# Patient Record
Sex: Male | Born: 1935 | Race: White | Hispanic: No | Smoking: Never smoker
Health system: Southern US, Community
[De-identification: ages and names within clinical notes are randomized; demographics above are authoritative.]

## PROBLEM LIST (undated history)

## (undated) DIAGNOSIS — S42309A Unspecified fracture of shaft of humerus, unspecified arm, initial encounter for closed fracture: Secondary | ICD-10-CM

## (undated) DIAGNOSIS — E119 Type 2 diabetes mellitus without complications: Secondary | ICD-10-CM

## (undated) DIAGNOSIS — C9110 Chronic lymphocytic leukemia of B-cell type not having achieved remission: Secondary | ICD-10-CM

## (undated) DIAGNOSIS — D649 Anemia, unspecified: Secondary | ICD-10-CM

## (undated) DIAGNOSIS — G709 Myoneural disorder, unspecified: Secondary | ICD-10-CM

## (undated) DIAGNOSIS — C9590 Leukemia, unspecified not having achieved remission: Secondary | ICD-10-CM

## (undated) HISTORY — DX: Unspecified fracture of shaft of humerus, unspecified arm, initial encounter for closed fracture: S42.309A

## (undated) HISTORY — DX: Anemia, unspecified: D64.9

## (undated) HISTORY — DX: Myoneural disorder, unspecified: G70.9

---

## 2005-04-08 ENCOUNTER — Ambulatory Visit: Payer: Self-pay | Admitting: Internal Medicine

## 2005-04-19 ENCOUNTER — Ambulatory Visit: Payer: Self-pay | Admitting: Internal Medicine

## 2005-07-22 ENCOUNTER — Ambulatory Visit: Payer: Self-pay | Admitting: Internal Medicine

## 2005-08-20 ENCOUNTER — Ambulatory Visit: Payer: Self-pay | Admitting: Internal Medicine

## 2005-11-19 ENCOUNTER — Ambulatory Visit: Payer: Self-pay | Admitting: Internal Medicine

## 2005-12-20 ENCOUNTER — Ambulatory Visit: Payer: Self-pay | Admitting: Internal Medicine

## 2008-03-20 ENCOUNTER — Ambulatory Visit: Payer: Self-pay | Admitting: Internal Medicine

## 2008-04-02 ENCOUNTER — Ambulatory Visit: Payer: Self-pay | Admitting: Internal Medicine

## 2008-04-19 ENCOUNTER — Ambulatory Visit: Payer: Self-pay | Admitting: Internal Medicine

## 2011-04-20 ENCOUNTER — Ambulatory Visit: Payer: Self-pay | Admitting: Internal Medicine

## 2011-04-27 ENCOUNTER — Inpatient Hospital Stay: Payer: Self-pay | Admitting: Internal Medicine

## 2011-05-07 ENCOUNTER — Ambulatory Visit: Payer: Self-pay | Admitting: Internal Medicine

## 2011-05-21 ENCOUNTER — Ambulatory Visit: Payer: Self-pay | Admitting: Internal Medicine

## 2011-06-20 ENCOUNTER — Ambulatory Visit: Payer: Self-pay | Admitting: Internal Medicine

## 2011-07-21 ENCOUNTER — Ambulatory Visit: Payer: Self-pay | Admitting: Internal Medicine

## 2011-08-21 ENCOUNTER — Ambulatory Visit: Payer: Self-pay | Admitting: Internal Medicine

## 2011-09-20 ENCOUNTER — Ambulatory Visit: Payer: Self-pay | Admitting: Internal Medicine

## 2011-10-21 ENCOUNTER — Ambulatory Visit: Payer: Self-pay | Admitting: Internal Medicine

## 2011-11-20 ENCOUNTER — Ambulatory Visit: Payer: Self-pay | Admitting: Internal Medicine

## 2011-12-21 ENCOUNTER — Ambulatory Visit: Payer: Self-pay | Admitting: Internal Medicine

## 2012-02-14 ENCOUNTER — Ambulatory Visit: Payer: Self-pay | Admitting: Internal Medicine

## 2012-02-14 LAB — CBC CANCER CENTER
Eosinophil #: 0.3 x10 3/mm (ref 0.0–0.7)
Eosinophil %: 3.1 %
HGB: 13.1 g/dL (ref 13.0–18.0)
Lymphocyte #: 6.3 x10 3/mm — ABNORMAL HIGH (ref 1.0–3.6)
Lymphocyte %: 61.9 %
MCV: 79 fL — ABNORMAL LOW (ref 80–100)
Monocyte #: 0.6 x10 3/mm (ref 0.0–0.7)
Monocyte %: 5.9 %
Neutrophil #: 2.9 x10 3/mm (ref 1.4–6.5)
Neutrophil %: 28.8 %
Platelet: 151 x10 3/mm (ref 150–440)
RBC: 5.01 10*6/uL (ref 4.40–5.90)
WBC: 10.1 x10 3/mm (ref 3.8–10.6)

## 2012-02-14 LAB — BASIC METABOLIC PANEL
Calcium, Total: 8.7 mg/dL (ref 8.5–10.1)
Co2: 29 mmol/L (ref 21–32)
Creatinine: 0.96 mg/dL (ref 0.60–1.30)
EGFR (Non-African Amer.): >60
Glucose: 273 mg/dL — ABNORMAL HIGH (ref 65–99)
Osmolality: 292 (ref 275–301)

## 2012-02-14 LAB — HEPATIC FUNCTION PANEL A (ARMC)
Albumin: 3.7 g/dL (ref 3.4–5.0)
Bilirubin,Total: 1.1 mg/dL — ABNORMAL HIGH (ref 0.2–1.0)
SGOT(AST): 19 U/L (ref 15–37)
SGPT (ALT): 31 U/L
Total Protein: 6.3 g/dL — ABNORMAL LOW (ref 6.4–8.2)

## 2012-02-18 ENCOUNTER — Ambulatory Visit: Payer: Self-pay | Admitting: Internal Medicine

## 2012-03-13 LAB — CBC CANCER CENTER
Eosinophil #: 0.2 x10 3/mm (ref 0.0–0.7)
Eosinophil %: 2.7 %
HCT: 41 % (ref 40.0–52.0)
Lymphocyte #: 4.1 x10 3/mm — ABNORMAL HIGH (ref 1.0–3.6)
MCH: 26.3 pg (ref 26.0–34.0)
MCHC: 32.8 g/dL (ref 32.0–36.0)
Monocyte #: 0.5 x10 3/mm (ref 0.0–0.7)
Monocyte %: 6.2 %
Neutrophil #: 3.1 x10 3/mm (ref 1.4–6.5)
Neutrophil %: 38.9 %

## 2012-03-20 ENCOUNTER — Ambulatory Visit: Payer: Self-pay | Admitting: Internal Medicine

## 2012-04-10 LAB — CBC CANCER CENTER
Basophil #: 0 x10 3/mm (ref 0.0–0.1)
Basophil %: 0.5 %
Eosinophil #: 0.3 x10 3/mm (ref 0.0–0.7)
HCT: 44.6 % (ref 40.0–52.0)
Lymphocyte #: 5.7 x10 3/mm — ABNORMAL HIGH (ref 1.0–3.6)
Lymphocyte %: 59.7 %
MCH: 25.7 pg — ABNORMAL LOW (ref 26.0–34.0)
MCHC: 31.3 g/dL — ABNORMAL LOW (ref 32.0–36.0)
Monocyte %: 6.8 %
Neutrophil #: 2.9 x10 3/mm (ref 1.4–6.5)
Neutrophil %: 30.2 %
RDW: 15.4 % — ABNORMAL HIGH (ref 11.5–14.5)
WBC: 9.5 x10 3/mm (ref 3.8–10.6)

## 2012-04-19 ENCOUNTER — Ambulatory Visit: Payer: Self-pay

## 2012-04-19 ENCOUNTER — Ambulatory Visit: Payer: Self-pay | Admitting: Internal Medicine

## 2012-06-04 ENCOUNTER — Observation Stay: Payer: Self-pay | Admitting: Hematology & Oncology

## 2012-06-04 LAB — CBC WITH DIFFERENTIAL/PLATELET
Comment - H1-Com1: NORMAL
HCT: 39.4 % — ABNORMAL LOW (ref 40.0–52.0)
HGB: 12.4 g/dL — ABNORMAL LOW (ref 13.0–18.0)
MCHC: 31.5 g/dL — ABNORMAL LOW (ref 32.0–36.0)
MCV: 84 fL (ref 80–100)
Monocytes: 2 %
Platelet: 140 10*3/uL — ABNORMAL LOW (ref 150–440)
Segmented Neutrophils: 25 %

## 2012-06-04 LAB — COMPREHENSIVE METABOLIC PANEL
Albumin: 3.4 g/dL (ref 3.4–5.0)
Alkaline Phosphatase: 79 U/L (ref 50–136)
Anion Gap: 8 (ref 7–16)
BUN: 26 mg/dL — ABNORMAL HIGH (ref 7–18)
Calcium, Total: 8.6 mg/dL (ref 8.5–10.1)
Chloride: 106 mmol/L (ref 98–107)
Co2: 29 mmol/L (ref 21–32)
Creatinine: 0.82 mg/dL (ref 0.60–1.30)
EGFR (African American): >60
EGFR (Non-African Amer.): >60
Osmolality: 296 (ref 275–301)
SGOT(AST): 11 U/L — ABNORMAL LOW (ref 15–37)
Sodium: 143 mmol/L (ref 136–145)

## 2012-06-04 LAB — LACTATE DEHYDROGENASE: LDH: 141 U/L (ref 87–241)

## 2012-06-04 LAB — RETICULOCYTES: Reticulocyte: 1.23 % (ref 0.5–1.5)

## 2013-09-10 ENCOUNTER — Ambulatory Visit: Payer: Self-pay | Admitting: Internal Medicine

## 2015-11-19 ENCOUNTER — Telehealth: Payer: Self-pay | Admitting: *Deleted

## 2015-11-19 DIAGNOSIS — C9110 Chronic lymphocytic leukemia of B-cell type not having achieved remission: Secondary | ICD-10-CM

## 2015-11-19 NOTE — Telephone Encounter (Addendum)
Called to rport that patient has fallen and is at Urgent care at [facility]. 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 I explained that we can not order labs at [facility] but told him he can request that they draw a CBC and metabolic panel. He repeated this back to me and said he will request this and ask them to fax a copy to [place]

## 2015-11-19 NOTE — Telephone Encounter (Signed)
I printed labs and showed them to Dr Rogue Bussing, He suggests that patient call adn make an appt very soon. And wants a cbc, cmp, LDH drawn when he comes in.

## 2015-11-20 NOTE — Telephone Encounter (Signed)
[name] called back after having a long discussion with his father and has scheduled an appt for next Wednesday. Lab orders entered

## 2015-11-21 ENCOUNTER — Emergency Department: Payer: Medicare Other

## 2015-11-21 ENCOUNTER — Telehealth: Payer: Self-pay | Admitting: *Deleted

## 2015-11-21 ENCOUNTER — Encounter: Payer: Self-pay | Admitting: Emergency Medicine

## 2015-11-21 ENCOUNTER — Inpatient Hospital Stay
Admission: EM | Admit: 2015-11-21 | Discharge: 2015-11-26 | DRG: 637 | Disposition: A | Discharge status: Skilled Nursing Facility | Payer: Medicare Other | Attending: Internal Medicine | Admitting: Internal Medicine

## 2015-11-21 DIAGNOSIS — S42209A Unspecified fracture of upper end of unspecified humerus, initial encounter for closed fracture: Secondary | ICD-10-CM

## 2015-11-21 DIAGNOSIS — C9110 Chronic lymphocytic leukemia of B-cell type not having achieved remission: Secondary | ICD-10-CM | POA: Diagnosis present

## 2015-11-21 DIAGNOSIS — W19XXXA Unspecified fall, initial encounter: Secondary | ICD-10-CM | POA: Diagnosis present

## 2015-11-21 DIAGNOSIS — Z8673 Personal history of transient ischemic attack (TIA), and cerebral infarction without residual deficits: Secondary | ICD-10-CM

## 2015-11-21 DIAGNOSIS — Z681 Body mass index (BMI) 19 or less, adult: Secondary | ICD-10-CM

## 2015-11-21 DIAGNOSIS — D649 Anemia, unspecified: Secondary | ICD-10-CM | POA: Diagnosis present

## 2015-11-21 DIAGNOSIS — R531 Weakness: Secondary | ICD-10-CM

## 2015-11-21 DIAGNOSIS — S42202A Unspecified fracture of upper end of left humerus, initial encounter for closed fracture: Secondary | ICD-10-CM

## 2015-11-21 DIAGNOSIS — T383X6A Underdosing of insulin and oral hypoglycemic [antidiabetic] drugs, initial encounter: Secondary | ICD-10-CM | POA: Diagnosis present

## 2015-11-21 DIAGNOSIS — Z66 Do not resuscitate: Secondary | ICD-10-CM | POA: Diagnosis present

## 2015-11-21 DIAGNOSIS — Y92009 Unspecified place in unspecified non-institutional (private) residence as the place of occurrence of the external cause: Secondary | ICD-10-CM | POA: Diagnosis not present

## 2015-11-21 DIAGNOSIS — R64 Cachexia: Secondary | ICD-10-CM

## 2015-11-21 DIAGNOSIS — Z9221 Personal history of antineoplastic chemotherapy: Secondary | ICD-10-CM | POA: Diagnosis not present

## 2015-11-21 DIAGNOSIS — E1165 Type 2 diabetes mellitus with hyperglycemia: Secondary | ICD-10-CM | POA: Diagnosis present

## 2015-11-21 DIAGNOSIS — D759 Disease of blood and blood-forming organs, unspecified: Secondary | ICD-10-CM | POA: Diagnosis not present

## 2015-11-21 DIAGNOSIS — D696 Thrombocytopenia, unspecified: Secondary | ICD-10-CM | POA: Diagnosis present

## 2015-11-21 DIAGNOSIS — S42302A Unspecified fracture of shaft of humerus, left arm, initial encounter for closed fracture: Secondary | ICD-10-CM

## 2015-11-21 DIAGNOSIS — Y939 Activity, unspecified: Secondary | ICD-10-CM | POA: Diagnosis not present

## 2015-11-21 DIAGNOSIS — D72829 Elevated white blood cell count, unspecified: Secondary | ICD-10-CM

## 2015-11-21 DIAGNOSIS — R739 Hyperglycemia, unspecified: Secondary | ICD-10-CM

## 2015-11-21 DIAGNOSIS — Z9119 Patient's noncompliance with other medical treatment and regimen: Secondary | ICD-10-CM

## 2015-11-21 DIAGNOSIS — C9112 Chronic lymphocytic leukemia of B-cell type in relapse: Secondary | ICD-10-CM | POA: Diagnosis not present

## 2015-11-21 DIAGNOSIS — E1310 Other specified diabetes mellitus with ketoacidosis without coma: Secondary | ICD-10-CM

## 2015-11-21 DIAGNOSIS — E43 Unspecified severe protein-calorie malnutrition: Secondary | ICD-10-CM | POA: Diagnosis present

## 2015-11-21 DIAGNOSIS — E875 Hyperkalemia: Secondary | ICD-10-CM | POA: Diagnosis present

## 2015-11-21 DIAGNOSIS — S42212A Unspecified displaced fracture of surgical neck of left humerus, initial encounter for closed fracture: Secondary | ICD-10-CM | POA: Diagnosis present

## 2015-11-21 DIAGNOSIS — R296 Repeated falls: Secondary | ICD-10-CM | POA: Diagnosis present

## 2015-11-21 HISTORY — DX: Type 2 diabetes mellitus without complications: E11.9

## 2015-11-21 HISTORY — DX: Leukemia, unspecified not having achieved remission: C95.90

## 2015-11-21 LAB — CBC WITH DIFFERENTIAL/PLATELET
BASOS ABS: 0 10*3/uL (ref 0–0.1)
Basophils Relative: 0 %
Blasts: 0 %
EOS ABS: 0 10*3/uL (ref 0–0.7)
EOS PCT: 0 %
HCT: 24.4 % — ABNORMAL LOW (ref 40.0–52.0)
Hemoglobin: 7.8 g/dL — ABNORMAL LOW (ref 13.0–18.0)
LYMPHS PCT: 98 %
Lymphs Abs: 142.1 10*3/uL — ABNORMAL HIGH (ref 1.0–3.6)
MCH: 28.5 pg (ref 26.0–34.0)
MCHC: 31.9 g/dL — ABNORMAL LOW (ref 32.0–36.0)
MCV: 89.3 fL (ref 80.0–100.0)
MONOS PCT: 0 %
Monocytes Absolute: 5.3 10*3/uL — ABNORMAL HIGH (ref 0.2–1.0)
NEUTROS PCT: 2 %
Neutro Abs: 17.5 10*3/uL — ABNORMAL HIGH (ref 1.4–6.5)
PLATELETS: 106 10*3/uL — AB (ref 150–440)
RBC: 2.74 MIL/uL — AB (ref 4.40–5.90)
RDW: 15.2 % — ABNORMAL HIGH (ref 11.5–14.5)
WBC: 175.4 10*3/uL — AB (ref 3.8–10.6)

## 2015-11-21 LAB — URINALYSIS COMPLETE WITH MICROSCOPIC (ARMC ONLY)
Bilirubin Urine: NEGATIVE
Glucose, UA: >500 mg/dL — AB
Hgb urine dipstick: NEGATIVE
KETONES UR: NEGATIVE mg/dL
Leukocytes, UA: NEGATIVE
Nitrite: NEGATIVE
PH: 5 (ref 5.0–8.0)
PROTEIN: 30 mg/dL — AB
RBC / HPF: NONE SEEN RBC/hpf (ref 0–5)
SQUAMOUS EPITHELIAL / LPF: NONE SEEN
Specific Gravity, Urine: 1.029 (ref 1.005–1.030)

## 2015-11-21 LAB — COMPREHENSIVE METABOLIC PANEL
ALBUMIN: 3.6 g/dL (ref 3.5–5.0)
ALT: 16 U/L — AB (ref 17–63)
AST: 25 U/L (ref 15–41)
Alkaline Phosphatase: 137 U/L — ABNORMAL HIGH (ref 38–126)
Anion gap: 7 (ref 5–15)
BUN: 29 mg/dL — AB (ref 6–20)
CHLORIDE: 103 mmol/L (ref 101–111)
CO2: 30 mmol/L (ref 22–32)
CREATININE: 0.62 mg/dL (ref 0.61–1.24)
Calcium: 8.7 mg/dL — ABNORMAL LOW (ref 8.9–10.3)
GFR calc Af Amer: >60 mL/min (ref >60)
GFR calc non Af Amer: >60 mL/min (ref >60)
GLUCOSE: 379 mg/dL — AB (ref 65–99)
Potassium: 5.8 mmol/L — ABNORMAL HIGH (ref 3.5–5.1)
SODIUM: 140 mmol/L (ref 135–145)
Total Bilirubin: 0.6 mg/dL (ref 0.3–1.2)
Total Protein: 5.9 g/dL — ABNORMAL LOW (ref 6.5–8.1)

## 2015-11-21 LAB — GLUCOSE, CAPILLARY
GLUCOSE-CAPILLARY: 176 mg/dL — AB (ref 65–99)
Glucose-Capillary: 263 mg/dL — ABNORMAL HIGH (ref 65–99)

## 2015-11-21 LAB — TROPONIN I: Troponin I: <0.03 ng/mL (ref <0.031)

## 2015-11-21 LAB — ABO/RH: ABO/RH(D): B POS

## 2015-11-21 LAB — HEMOGLOBIN AND HEMATOCRIT, BLOOD
HCT: 22.2 % — ABNORMAL LOW (ref 40.0–52.0)
Hemoglobin: 7.2 g/dL — ABNORMAL LOW (ref 13.0–18.0)

## 2015-11-21 LAB — CK: Total CK: 39 U/L — ABNORMAL LOW (ref 49–397)

## 2015-11-21 LAB — PREPARE RBC (CROSSMATCH)

## 2015-11-21 LAB — LACTATE DEHYDROGENASE: LDH: 201 U/L — ABNORMAL HIGH (ref 98–192)

## 2015-11-21 MED ORDER — INSULIN ASPART 100 UNIT/ML ~~LOC~~ SOLN
0.0000 [IU] | Freq: Three times a day (TID) | SUBCUTANEOUS | Status: DC
  Administered 2015-11-21: 5 [IU] via SUBCUTANEOUS
  Administered 2015-11-23: 12:00:00 2 [IU] via SUBCUTANEOUS
  Administered 2015-11-23: 7 [IU] via SUBCUTANEOUS
  Administered 2015-11-23 – 2015-11-24 (×2): 3 [IU] via SUBCUTANEOUS
  Administered 2015-11-24: 18:00:00 5 [IU] via SUBCUTANEOUS
  Administered 2015-11-24: 09:00:00 2 [IU] via SUBCUTANEOUS
  Administered 2015-11-25 (×2): 3 [IU] via SUBCUTANEOUS
  Administered 2015-11-25 – 2015-11-26 (×2): 2 [IU] via SUBCUTANEOUS
  Administered 2015-11-26: 7 [IU] via SUBCUTANEOUS
  Filled 2015-11-21: qty 3
  Filled 2015-11-21: qty 2
  Filled 2015-11-21: qty 5
  Filled 2015-11-21: qty 3
  Filled 2015-11-21: qty 10
  Filled 2015-11-21: qty 5
  Filled 2015-11-21: qty 7
  Filled 2015-11-21: qty 6
  Filled 2015-11-21 (×2): qty 2
  Filled 2015-11-21: qty 5
  Filled 2015-11-21 (×2): qty 3

## 2015-11-21 MED ORDER — SENNOSIDES-DOCUSATE SODIUM 8.6-50 MG PO TABS: 1.0000 | ORAL_TABLET | Freq: Every evening | ORAL | Status: DC | PRN

## 2015-11-21 MED ORDER — INSULIN ASPART 100 UNIT/ML ~~LOC~~ SOLN
3.0000 [IU] | Freq: Three times a day (TID) | SUBCUTANEOUS | Status: DC
  Administered 2015-11-21 – 2015-11-26 (×12): 3 [IU] via SUBCUTANEOUS
  Filled 2015-11-21 (×8): qty 3

## 2015-11-21 MED ORDER — ONDANSETRON HCL 4 MG/2ML IJ SOLN: 4.0000 mg | Freq: Four times a day (QID) | INTRAMUSCULAR | Status: DC | PRN

## 2015-11-21 MED ORDER — SODIUM CHLORIDE 0.9 % IV SOLN
INTRAVENOUS | Status: DC
  Administered 2015-11-21 – 2015-11-25 (×6): via INTRAVENOUS

## 2015-11-21 MED ORDER — INSULIN DETEMIR 100 UNIT/ML ~~LOC~~ SOLN
5.0000 [IU] | Freq: Every day | SUBCUTANEOUS | Status: DC
  Administered 2015-11-21 – 2015-11-22 (×2): 5 [IU] via SUBCUTANEOUS
  Filled 2015-11-21 (×4): qty 0.05

## 2015-11-21 MED ORDER — SODIUM CHLORIDE 0.9 % IV SOLN
Freq: Once | INTRAVENOUS | Status: AC
  Administered 2015-11-21: 11:00:00 via INTRAVENOUS

## 2015-11-21 MED ORDER — SODIUM POLYSTYRENE SULFONATE 15 GM/60ML PO SUSP
30.0000 g | Freq: Once | ORAL | Status: AC
  Administered 2015-11-21: 30 g via ORAL
  Filled 2015-11-21: qty 120

## 2015-11-21 MED ORDER — ALUM & MAG HYDROXIDE-SIMETH 200-200-20 MG/5ML PO SUSP: 30.0000 mL | Freq: Four times a day (QID) | ORAL | Status: DC | PRN

## 2015-11-21 MED ORDER — ACETAMINOPHEN 325 MG PO TABS
650.0000 mg | ORAL_TABLET | Freq: Four times a day (QID) | ORAL | Status: DC | PRN
  Administered 2015-11-23: 650 mg via ORAL
  Filled 2015-11-21: qty 2

## 2015-11-21 MED ORDER — ONDANSETRON HCL 4 MG PO TABS: 4.0000 mg | ORAL_TABLET | Freq: Four times a day (QID) | ORAL | Status: DC | PRN

## 2015-11-21 MED ORDER — ACETAMINOPHEN 650 MG RE SUPP: 650.0000 mg | Freq: Four times a day (QID) | RECTAL | Status: DC | PRN

## 2015-11-21 MED ORDER — SODIUM CHLORIDE 0.9 % IV SOLN
Freq: Once | INTRAVENOUS | Status: AC
  Administered 2015-11-21: 17:00:00 via INTRAVENOUS

## 2015-11-21 NOTE — Telephone Encounter (Signed)
Called to report patient is in ER. Per records, pt has fallen several times in past 2 days

## 2015-11-21 NOTE — ED Notes (Signed)
This nurse spoke with Bing Plume over the phone and received verbal consent for patient to received blood products.

## 2015-11-21 NOTE — ED Notes (Signed)
Patient transported to CT 

## 2015-11-21 NOTE — ED Notes (Signed)
Pt here from home via [facility]. EMS reports pt is A&O x4, pt fell last night and again this AM. EMS denies any injuries. Pt with hx of leukemia, not undergoing any treatment.

## 2015-11-21 NOTE — ED Provider Notes (Signed)
[facility] [place] Emergency Department Provider Note     Time seen: ----------------------------------------- 9:54 AM on 11/21/2015 -----------------------------------------    I have reviewed the triage vital signs and the nursing notes.   HISTORY  Chief Complaint Fall    HPI Wesley Mcguire is a 81 y.o. male who presents to ER for falls. Patient fell last night and again this morning. EMS denies that he had any injury. Patient does not history of leukemia but is not undergoing any treatment.Patient reportedly is wearing a shoulder sling from a fall last month, this is his only complaint of pain. He denies any recent changes in his medicines, states he is eating and drinking normally. Past medical history positive for CLL  There are no active problems to display for this patient.   No past surgical history on file.  Allergies Review of patient's allergies indicates not on file.  Social History Social History  Substance Use Topics  . Smoking status: Not on file  . Smokeless tobacco: Not on file  . Alcohol Use: Not on file    Review of Systems Constitutional: Negative for fever. Eyes: Negative for visual changes. ENT: Negative for sore throat. Cardiovascular: Negative for chest pain. Respiratory: Negative for shortness of breath. Gastrointestinal: Negative for abdominal pain, vomiting and diarrhea. Genitourinary: Negative for dysuria. Musculoskeletal: Negative for back pain. Skin: Negative for rash. Neurological: Negative for headaches, focal weakness or numbness. Positive for frequent falls  10-point ROS otherwise negative.  ____________________________________________   PHYSICAL EXAM:  VITAL SIGNS: ED Triage Vitals  Enc Vitals Group     BP --      Pulse --      Resp --      Temp --      Temp src --      SpO2 11/21/15 0952 96 %     Weight --      Height --      Head Cir --      Peak Flow --      Pain Score --      Pain Loc  --      Pain Edu? --      Excl. in [place]? --     Constitutional: Alert, in no distress. Eyes: Conjunctivae are normal. PERRL. Normal extraocular movements. ENT   Head: Normocephalic and atraumatic.   Nose: No congestion/rhinnorhea.   Mouth/Throat: Mucous membranes are moist.   Neck: No stridor. Cardiovascular: Normal rate, regular rhythm. Normal and symmetric distal pulses are present in all extremities. No murmurs, rubs, or gallops. Respiratory: Normal respiratory effort without tachypnea nor retractions. Breath sounds are clear and equal bilaterally. No wheezes/rales/rhonchi. Gastrointestinal: Soft and nontender. No distention.  Musculoskeletal: Nontender with normal range of motion in all extremities. No joint effusions.  No lower extremity tenderness nor edema. Neurologic:  Normal speech and language. No gross focal neurologic deficits are appreciated. Speech is normal.  Skin:  Skin is warm, dry and intact. No rash noted. Psychiatric: Mood and affect are normal. Speech and behavior are normal. Patient exhibits appropriate insight and judgment. ____________________________________________  EKG: Interpreted by me. Sinus rhythm with a rate of 79 bpm, PVCs, low voltage, normal axis. No evidence of acute infarction.  ____________________________________________  ED COURSE:  Pertinent labs & imaging results that were available during my care of the patient were reviewed by me and considered in my medical decision making (see chart for details). Patients in no acute distress, will check basic labs and reevaluate. ____________________________________________  LABS (pertinent positives/negatives)  Labs Reviewed  COMPREHENSIVE METABOLIC PANEL - Abnormal; Notable for the following:    Potassium 5.8 (*)    Glucose, Bld 379 (*)    BUN 29 (*)    Calcium 8.7 (*)    Total Protein 5.9 (*)    ALT 16 (*)    Alkaline Phosphatase 137 (*)    All other components within normal  limits  URINALYSIS COMPLETEWITH MICROSCOPIC ([facility] ONLY) - Abnormal; Notable for the following:    Color, Urine YELLOW (*)    APPearance CLEAR (*)    Glucose, UA >500 (*)    Protein, ur 30 (*)    Bacteria, UA RARE (*)    All other components within normal limits  TROPONIN I  CBC WITH DIFFERENTIAL/PLATELET   CRITICAL CARE Performed by: Earleen [place]   Total critical care time: 30 minutes  Critical care time was exclusive of separately billable procedures and treating other patients.  Critical care was necessary to treat or prevent imminent or life-threatening deterioration.  Critical care was time spent personally by me on the following activities: development of treatment plan with patient and/or surrogate as well as nursing, discussions with consultants, evaluation of patient's response to treatment, examination of patient, obtaining history from patient or surrogate, ordering and performing treatments and interventions, ordering and review of laboratory studies, ordering and review of radiographic studies, pulse oximetry and re-evaluation of patient's condition.  RADIOLOGY Images were viewed by me  CT head, chest x-ray IMPRESSION: 1. No evidence of acute cardiopulmonary disease. 2. Proximal left humerus fracture.  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 No acute intracranial abnormality seen. ____________________________________________  FINAL ASSESSMENT AND PLAN  Weakness, frequent falls, humerus fracture, CLL, hyperkalemia  Plan: Patient with labs and imaging as dictated above. Patient is started on normal saline here. I discussed with oncology on call who recommends admission at this time. Dr.Brahmanday has seen the patient in the ER and agrees with admission. He's been started on saline for his hyperkalemia and hyperglycemia, likely new onset diabetes as well.   Earleen [place], MD   Earleen [place], MD 11/21/15 [phone]

## 2015-11-21 NOTE — Consult Note (Addendum)
[facility] CONSULT NOTE  Patient Care Team: Pcp Not In System as PCP - General  CHIEF COMPLAINTS/PURPOSE OF CONSULTATION: CLL  HISTORY OF PRESENTING ILLNESS: Please note patient is a poor historian/no family by the bedside. History is reviewed talking with the patient/ER physician/reviewing old office records  Wesley Mcguire 81 y.o.  male with a prior history of CLL that was treated with rituximab single agent at least 2-3 years ago [Dr.Pandit]- with good response on the treatment. Patient had not followed up in the [place] for the last few years.   However, more recently patient had been falling down hurting himself. Patient fell this morning- and I x-ray noted to have left-sided proximal humerus fracture.  More recently his blood counts were checked as an outpatient- white count was noted to be around 220,000 with a hemoglobin around 9 platelets 117. In the emergency room patient's potassium was elevated at 5.8; creatinine normal-0.6 to; however glucose elevated at 379. CT brain negative for any acute injury.   ROS: A complete 10 point review of systems attempted/ however unreliable given patient's poor history giving skills.   MEDICAL HISTORY:  Past Medical History  Diagnosis Date  . Diabetes mellitus without complication ([place])   . Leukemia ([place])     SURGICAL HISTORY:/Difficult to assess  No past surgical history on file.  SOCIAL HISTORY: /difficult to assess Social History   Social History  . Marital Status: Divorced    Spouse Name: N/A  . Number of Children: N/A  . Years of Education: N/A   Occupational History  . Not on file.   Social History Main Topics  . Smoking status: Never Smoker   . Smokeless tobacco: Not on file  . Alcohol Use: No  . Drug Use: Not on file  . Sexual Activity: Not on file   Other Topics Concern  . Not on file   Social History Narrative  . No narrative on file    FAMILY HISTORY: No family history on  file./difficult to assess   ALLERGIES:  has No Known Allergies.  MEDICATIONS:  No current facility-administered medications for this encounter.   Current Outpatient Prescriptions  Medication Sig Dispense Refill  . traMADol (ULTRAM) 50 MG tablet Take 50 mg by mouth every 6 (six) hours as needed for moderate pain or severe pain.         Marland Kitchen  PHYSICAL EXAMINATION:   Filed Vitals:   11/21/15 1230 11/21/15 1245  BP: 112/68   Pulse:    Temp:    Resp: 19 19   Filed Weights   11/21/15 0956  Weight: 109 lb (49.442 kg)    GENERAL: Cachectic appearing Caucasian male patient ; resting comfortably in the ER /stretcher. No acute distress. No family around EYES: no pallor or icterus OROPHARYNX: no thrush or ulceration;  poor dentition NECK: supple, no masses felt LYMPH:  no palpable lymphadenopathy in the cervical, axillary or inguinal regions LUNGS: clear to auscultation and  No wheeze or crackles HEART/CVS: regular rate & rhythm and no murmurs; No lower extremity edema ABDOMEN: abdomen soft, non-tender and normal bowel sounds Musculoskeletal:no cyanosis of digits and no clubbing  PSYCH: alert & oriented1-2.  NEURO: no focal motor/sensory deficits SKIN:  no rashes or significant lesions.   LABORATORY DATA:  I have reviewed the data as listed Lab Results  Component Value Date   WBC 13.4* 06/04/2012   HGB 12.4* 06/04/2012   HCT 39.4* 06/04/2012   MCV 84 06/04/2012  PLT 140* 06/04/2012    Recent Labs  11/21/15 1016  NA 140  K 5.8*  CL 103  CO2 30  GLUCOSE 379*  BUN 29*  CREATININE 0.62  CALCIUM 8.7*  GFRNONAA >60  GFRAA >60  PROT 5.9*  ALBUMIN 3.6  AST 25  ALT 16*  ALKPHOS 137*  BILITOT 0.6    RADIOGRAPHIC STUDIES: I have personally reviewed the radiological images as listed and agreed with the findings in the report. Dg Chest 1 View  11/21/2015  CLINICAL DATA:  Weakness.  Frequent falls. EXAM: CHEST 1 VIEW COMPARISON:  04/27/2011 FINDINGS: There is a  proximal left humerus fracture as described on dedicated humerus radiography. Normal heart size and mediastinal contours. There is no edema, consolidation, effusion, or pneumothorax. Marked osteopenia. Gas under the diaphragm appears intraluminal. There is no indication of abdominal pain. IMPRESSION: 1. No evidence of acute cardiopulmonary disease. 2. Proximal left humerus fracture. Electronically Signed   By: Monte Fantasia M.D.   On: 11/21/2015 11:24   Ct Head Wo Contrast  11/21/2015  CLINICAL DATA:  Multiple falls.  Weakness. EXAM: CT HEAD WITHOUT CONTRAST TECHNIQUE: Contiguous axial images were obtained from the base of the skull through the vertex without intravenous contrast. COMPARISON:  None. FINDINGS: Bony calvarium appears intact. Left temporal encephalomalacia is noted consistent with old infarction. No mass effect or midline shift is noted. Ventricular size is within normal limits. There is no evidence of mass lesion, hemorrhage or acute infarction. 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 No acute intracranial abnormality seen. Electronically Signed   By: Marijo Conception, M.D.   On: 11/21/2015 11:10   Dg Humerus Left  11/21/2015  CLINICAL DATA:  Several falls over the past few days. Swelling and bruising of the left arm. See LL EXAM: LEFT HUMERUS - 2+ VIEW COMPARISON:  None. FINDINGS: There is a fracture across the surgical neck of the humerus with at least 5 mm of medial displacement. Probable continuation across the greater tuberosity. No visible head continuation but not well evaluated due to obliquity. No indication of dislocation. Lucency in the distal clavicle appears corticated and chronic. Marked osteopenia. IMPRESSION: 1. Displaced surgical neck fracture of the left humerus, likely with continuation across the greater tuberosity. The fracture would be better evaluated by shoulder series. 2. Marked osteopenia. Electronically Signed   By: Monte Fantasia M.D.   On: 11/21/2015 11:20     ASSESSMENT & PLAN:   81 year old male patient with a history of CLL with recurrence of elevated white count- currently admitted to the hospital for generalized weakness/multiple falls. Unfortunately history is limited based on patient's poor history giving skills.  # CLL- most recent white count is 220k/hemoglobin 9.3/ platelets 117. Clinically I do not think patient's current CLL recurrence is the cause of his generalized weakness. However this needs further workup including- flow cytometry to rule out any transformation to aggressive lymphoma/leukemia. Patient will also need CAT scan chest abdomen pelvis once hyperkalemia improves. Also check LDH.  # Generalized weakness-unclear etiology; CLL recurrence is less likely possibility/ Other Causes her to be ruled out. Question hyperglycemia/poorly controlled diabetes reason for his falls/generalized weakness.   # With regards to humerus fracture- if any surgical intervention needed; okay from hematology standpoint- as long hemoglobin is above 10.  Thank you Dr. Jimmye Norman for allowing me to participate in the care of your pleasant patient.      Cammie Sickle, MD 11/21/2015 1:00 PM   I spoke to the patient's son  over the phone Annie Main at [phone]; who feels patient is ready for treatments. I tried to explain to the son that CLL recurrence/elevated  White cell count; anemia with a hemoglobin 9 itself is less likely cause of his generalized weakness/falls. If acute issues resolve- patient might be candidate for single agent rituximab. Discussed this at length with the patient's son. He agrees.

## 2015-11-21 NOTE — H&P (Signed)
[facility] [place] NAME: Marcum Arkwright    MR#:  [id number]  DATE OF BIRTH:  February 14, 1931  DATE OF ADMISSION:  11/21/2015  PRIMARY CARE PHYSICIAN: Pcp Not In System  Dr.Bert  Caryl Comes  REQUESTING/REFERRING PHYSICIAN: Dr. Lenise Arena  CHIEF COMPLAINT: generalized weakness, falls   Chief Complaint  Patient presents with  . Fall    HISTORY OF PRESENT ILLNESS:  Jabez Smtih  is a 81 y.o. male with a known [facility]. In with generalized weakness, falls. According to the hospice nurse, patient and the patient's son wants treatment for CLL. Patient also has diabetes but not taking any medicines. And he takes only tramadol for the pain. Now requesting treatment for CLL, diabetes. Noted to have recent left shoulder fracture and was given sling. Patient denies recent nausea, vomiting, diarrhea. No abdominal pain. Appetite is very good. No recent fevers. Lab work showed hyperkalemia potassium 5.8, and anemia with hemoglobin 7.8. Patient has appointment to see Dr. Rogue Bussing next Wednesday.  PAST MEDICAL HISTORY:   Past Medical History  Diagnosis Date  . Diabetes mellitus without complication ([place])   . Leukemia ([place])     PAST SURGICAL HISTOIRY:  No past surgical history on file.  SOCIAL HISTORY:   Social History  Substance Use Topics  . Smoking status: Never Smoker   . Smokeless tobacco: Not on file  . Alcohol Use: No    FAMILY HISTORY:  No family history on file. No history of hypertension, diabetes in the family.  DRUG ALLERGIES:  No Known Allergies  REVIEW OF SYSTEMS:  CONSTITUTIONAL: No fever, fatigue or weakness. Cachectic  EYES: No blurred or double vision.  EARS, NOSE, AND THROAT: No tinnitus or ear pain.  RESPIRATORY: No cough, shortness of breath, wheezing or hemoptysis.  CARDIOVASCULAR: No chest pain, orthopnea, edema.  GASTROINTESTINAL: No nausea,  vomiting, diarrhea or abdominal pain.  GENITOURINARY: No dysuria, hematuria.  ENDOCRINE: No polyuria, nocturia,  HEMATOLOGY: No anemia, easy bruising or bleeding SKIN: No rash or lesion. MUSCULOSKELETAL:Shoulder pain present, C: No tingling, numbness, weakness.  PSYCHIATRY: No anxiety or depression.   MEDICATIONS AT HOME:   Prior to Admission medications   Medication Sig Start Date End Date Taking? Authorizing Provider  traMADol (ULTRAM) 50 MG tablet Take 50 mg by mouth every 6 (six) hours as needed for moderate pain or severe pain.    Yes Historical Provider, MD      VITAL SIGNS:  Blood pressure 126/72, pulse 75, temperature 96.8 F (36 C), temperature source Oral, resp. rate 15, weight 49.442 kg (109 lb), SpO2 99 %.  PHYSICAL EXAMINATION:  GENERAL:  81 y.o.-year-old patient lying in the bed with no acute distress,cachectic.Marland Kitchen  EYES: Pupils equal, round, reactive to light and accommodation. No scleral icterus. Extraocular muscles intact.  HEENT: Head atraumatic, normocephalic. Oropharynx and nasopharynx clear. ,dry mucous membranes, NECK:  Supple, no jugular venous distention. No thyroid enlargement, no tenderness.  LUNGS: Normal breath sounds bilaterally, no wheezing, rales,rhonchi or crepitation. No use of accessory muscles of respiration.  CARDIOVASCULAR: S1, S2 normal. No murmurs, rubs, or gallops.  ABDOMEN: Soft, nontender, nondistended. Bowel sounds present. No organomegaly or mass.  EXTREMITIES: No pedal edema, cyanosis, or clubbing.  NEUROLOGIC: Cranial nerves II through XII are intact. Muscle strength 5/5 in all extremities. Sensation intact. Gait not checked.  PSYCHIATRIC: The patient is alert and oriented x 3.  SKIN: No obvious rash, lesion,  or ulcer.   LABORATORY PANEL:   CBC  Recent Labs Lab 11/21/15 1016  WBC 175.4*  HGB 7.8*  HCT 24.4*  PLT 106*    ------------------------------------------------------------------------------------------------------------------  Chemistries   Recent Labs Lab 11/21/15 1016  NA 140  K 5.8*  CL 103  CO2 30  GLUCOSE 379*  BUN 29*  CREATININE 0.62  CALCIUM 8.7*  AST 25  ALT 16*  ALKPHOS 137*  BILITOT 0.6   ------------------------------------------------------------------------------------------------------------------  Cardiac Enzymes  Recent Labs Lab 11/21/15 1016  TROPONINI <0.03   ------------------------------------------------------------------------------------------------------------------  RADIOLOGY:  Dg Chest 1 View  11/21/2015  CLINICAL DATA:  Weakness.  Frequent falls. EXAM: CHEST 1 VIEW COMPARISON:  04/27/2011 FINDINGS: There is a proximal left humerus fracture as described on dedicated humerus radiography. Normal heart size and mediastinal contours. There is no edema, consolidation, effusion, or pneumothorax. Marked osteopenia. Gas under the diaphragm appears intraluminal. There is no indication of abdominal pain. IMPRESSION: 1. No evidence of acute cardiopulmonary disease. 2. Proximal left humerus fracture. Electronically Signed   By: Monte Fantasia M.D.   On: 11/21/2015 11:24   Ct Head Wo Contrast  11/21/2015  CLINICAL DATA:  Multiple falls.  Weakness. EXAM: CT HEAD WITHOUT CONTRAST TECHNIQUE: Contiguous axial images were obtained from the base of the skull through the vertex without intravenous contrast. COMPARISON:  None. FINDINGS: Bony calvarium appears intact. Left temporal encephalomalacia is noted consistent with old infarction. No mass effect or midline shift is noted. Ventricular size is within normal limits. There is no evidence of mass lesion, hemorrhage or acute infarction. 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 No acute intracranial abnormality seen. Electronically Signed   By: Marijo Conception, M.D.   On: 11/21/2015 11:10   Dg Humerus Left  11/21/2015   CLINICAL DATA:  Several falls over the past few days. Swelling and bruising of the left arm. See LL EXAM: LEFT HUMERUS - 2+ VIEW COMPARISON:  None. FINDINGS: There is a fracture across the surgical neck of the humerus with at least 5 mm of medial displacement. Probable continuation across the greater tuberosity. No visible head continuation but not well evaluated due to obliquity. No indication of dislocation. Lucency in the distal clavicle appears corticated and chronic. Marked osteopenia. IMPRESSION: 1. Displaced surgical neck fracture of the left humerus, likely with continuation across the greater tuberosity. The fracture would be better evaluated by shoulder series. 2. Marked osteopenia. Electronically Signed   By: Monte Fantasia M.D.   On: 11/21/2015 11:20    EKG:   Orders placed or performed during the hospital encounter of 11/21/15  . ED EKG  . ED EKG  . EKG 12-Lead  . EKG 12-Lead  EKG showed sinus rhythm 79 bpm and PVCs.  IMPRESSION AND PLAN:  #1 acute hyperkalemia  Likely secondary to fall: Start Kayexalate,monitor on tele,repeat  Potassium levels. Check CK level for possible rhabdomyolysis. 2 hyperglycemia with known history of diabetes mellitus not taking any regimen for diabetes: Start the patient on now sliding scale insulin, basal insulin and request diabetic nurse to see the patient, check hemoglobin A1c. #3 history of CLL not taking any treatment and followed with the hospice since July: Now patient and the family requesting treatment: Request oncology consult. #4 displaced fracture of proximal end of left humerus: pain control,continue sling for left shoulder,consult ortho,check xray 5.remote strokes; unable to use aspirin secondary to anemia: #6 anemia with generalized weakness ;transfusion of 1 unit packed RBC History of falls deconditioning physical therapy consult.  All  the records are reviewed and case discussed with ED provider. Management plans discussed with the  patient, family and they are in agreement.  CODE STATUS: DO NOT RESUSCITATE: Patient stated that he wishes to be DO NOT RESUSCITATE.  TOTAL TIME TAKING CARE OF THIS PATIENT: 55  minutes.    Epifanio Lesches M.D on 11/21/2015 at 2:28 PM  Between 7am to 6pm - Pager - [phone]  After 6pm go to [email or web address] - password [email or web address] [facility]  Office  [phone]  CC: Primary care physician; Pcp Not In System  Note: This dictation was prepared with Dragon dictation along with smaller phrase technology. Any transcriptional errors that result from this process are unintentional.

## 2015-11-22 ENCOUNTER — Inpatient Hospital Stay: Payer: Medicare Other

## 2015-11-22 ENCOUNTER — Encounter: Payer: Self-pay | Admitting: Radiology

## 2015-11-22 DIAGNOSIS — E43 Unspecified severe protein-calorie malnutrition: Secondary | ICD-10-CM | POA: Insufficient documentation

## 2015-11-22 LAB — HEMOGLOBIN AND HEMATOCRIT, BLOOD
HEMATOCRIT: 24.2 % — AB (ref 40.0–52.0)
Hemoglobin: 8 g/dL — ABNORMAL LOW (ref 13.0–18.0)

## 2015-11-22 LAB — GLUCOSE, CAPILLARY
GLUCOSE-CAPILLARY: 85 mg/dL (ref 65–99)
GLUCOSE-CAPILLARY: 113 mg/dL — AB (ref 65–99)
Glucose-Capillary: 252 mg/dL — ABNORMAL HIGH (ref 65–99)

## 2015-11-22 LAB — BASIC METABOLIC PANEL
Anion gap: 5 (ref 5–15)
BUN: 26 mg/dL — AB (ref 6–20)
CALCIUM: 7.9 mg/dL — AB (ref 8.9–10.3)
CO2: 27 mmol/L (ref 22–32)
CREATININE: 0.41 mg/dL — AB (ref 0.61–1.24)
Chloride: 109 mmol/L (ref 101–111)
Glucose, Bld: 101 mg/dL — ABNORMAL HIGH (ref 65–99)
Potassium: 4 mmol/L (ref 3.5–5.1)
SODIUM: 141 mmol/L (ref 135–145)

## 2015-11-22 LAB — CBC
HCT: 26.2 % — ABNORMAL LOW (ref 40.0–52.0)
Hemoglobin: 7.8 g/dL — ABNORMAL LOW (ref 13.0–18.0)
MCH: 27.6 pg (ref 26.0–34.0)
MCHC: 29.9 g/dL — ABNORMAL LOW (ref 32.0–36.0)
MCV: 92.3 fL (ref 80.0–100.0)
Platelets: 88 10*3/uL — ABNORMAL LOW (ref 150–440)
RBC: 2.84 MIL/uL — AB (ref 4.40–5.90)
RDW: 14.7 % — AB (ref 11.5–14.5)
WBC: 138.5 10*3/uL — AB (ref 3.8–10.6)

## 2015-11-22 LAB — HEMOGLOBIN A1C
HEMOGLOBIN A1C: 12.7 % — AB (ref 4.0–6.0)
HEMOGLOBIN A1C: 11.1 % — AB (ref 4.0–6.0)

## 2015-11-22 MED ORDER — IOHEXOL 300 MG/ML  SOLN
100.0000 mL | Freq: Once | INTRAMUSCULAR | Status: AC | PRN
  Administered 2015-11-22: 100 mL via INTRAVENOUS

## 2015-11-22 MED ORDER — ENSURE ENLIVE PO LIQD
237.0000 mL | Freq: Three times a day (TID) | ORAL | Status: DC
  Administered 2015-11-22 – 2015-11-26 (×13): 237 mL via ORAL

## 2015-11-22 NOTE — Progress Notes (Addendum)
Patient ID: Wesley Mcguire, male   DOB: [birth year], 81 y.o.   MRN: [id number] [facility] [place] NAME: Wesley Mcguire    MR#:  [id number]  DATE OF BIRTH:  Jun 13, 1931  SUBJECTIVE:  Came in after a fall at home. Found to have elevated K  REVIEW OF SYSTEMS:   Review of Systems  Constitutional: Negative for fever, chills and weight loss.  HENT: Negative for ear discharge, ear pain and nosebleeds.   Eyes: Negative for blurred vision, pain and discharge.  Respiratory: Negative for sputum production, shortness of breath, wheezing and stridor.   Cardiovascular: Negative for chest pain, palpitations, orthopnea and PND.  Gastrointestinal: Negative for nausea, vomiting, abdominal pain and diarrhea.  Genitourinary: Negative for urgency and frequency.  Musculoskeletal: Positive for back pain and falls. Negative for joint pain.  Neurological: Positive for weakness. Negative for sensory change, speech change and focal weakness.  Psychiatric/Behavioral: Negative for depression and hallucinations. The patient is not nervous/anxious.   All other systems reviewed and are negative.  Tolerating Diet:yes Tolerating PT: pending  DRUG ALLERGIES:  No Known Allergies  VITALS:  Blood pressure 108/53, pulse 69, temperature 98 F (36.7 C), temperature source Oral, resp. rate 20, height 5\' 9"  (1.753 m), weight 48.943 kg (107 lb 14.4 oz), SpO2 98 %.  PHYSICAL EXAMINATION:   Physical Exam  GENERAL:  81 y.o.-year-old patient lying in the bed with no acute distress.  EYES: Pupils equal, round, reactive to light and accommodation. No scleral icterus. Extraocular muscles intact.  HEENT: Head atraumatic, normocephalic. Oropharynx and nasopharynx clear.  NECK:  Supple, no jugular venous distention. No thyroid enlargement, no tenderness.  LUNGS: Normal breath sounds bilaterally, no wheezing, rales, rhonchi. No use of accessory muscles of respiration.   CARDIOVASCULAR: S1, S2 normal. No murmurs, rubs, or gallops.  ABDOMEN: Soft, nontender, nondistended. Bowel sounds present. No organomegaly or mass.  EXTREMITIES: No cyanosis, clubbing or edema b/l.    NEUROLOGIC: Cranial nerves II through XII are intact. No focal Motor or sensory deficits b/l.   PSYCHIATRIC: The patient is alert and oriented x 3.  SKIN: No obvious rash, lesion, or ulcer.    LABORATORY PANEL:   CBC  Recent Labs Lab 11/22/15 0543 11/22/15 0744  WBC 138.5*  --   HGB 7.8* 8.0*  HCT 26.2* 24.2*  PLT 88*  --     Chemistries   Recent Labs Lab 11/21/15 1016 11/22/15 0543  NA 140 141  K 5.8* 4.0  CL 103 109  CO2 30 27  GLUCOSE 379* 101*  BUN 29* 26*  CREATININE 0.62 0.41*  CALCIUM 8.7* 7.9*  AST 25  --   ALT 16*  --   ALKPHOS 137*  --   BILITOT 0.6  --     Cardiac Enzymes  Recent Labs Lab 11/21/15 1016  TROPONINI <0.03    RADIOLOGY:  Dg Chest 1 View  11/21/2015  CLINICAL DATA:  Weakness.  Frequent falls. EXAM: CHEST 1 VIEW COMPARISON:  04/27/2011 FINDINGS: There is a proximal left humerus fracture as described on dedicated humerus radiography. Normal heart size and mediastinal contours. There is no edema, consolidation, effusion, or pneumothorax. Marked osteopenia. Gas under the diaphragm appears intraluminal. There is no indication of abdominal pain. IMPRESSION: 1. No evidence of acute cardiopulmonary disease. 2. Proximal left humerus fracture. Electronically Signed   By: Monte Fantasia M.D.   On: 11/21/2015 11:24   Ct Head Wo Contrast  11/21/2015  CLINICAL DATA:  Multiple falls.  Weakness. EXAM: CT HEAD WITHOUT CONTRAST TECHNIQUE: Contiguous axial images were obtained from the base of the skull through the vertex without intravenous contrast. COMPARISON:  None. FINDINGS: Bony calvarium appears intact. Left temporal encephalomalacia is noted consistent with old infarction. No mass effect or midline shift is noted. Ventricular size is within normal  limits. There is no evidence of mass lesion, hemorrhage or acute infarction. 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 No acute intracranial abnormality seen. Electronically Signed   By: Marijo Conception, M.D.   On: 11/21/2015 11:10   Dg Humerus Left  11/21/2015  CLINICAL DATA:  Several falls over the past few days. Swelling and bruising of the left arm. See LL EXAM: LEFT HUMERUS - 2+ VIEW COMPARISON:  None. FINDINGS: There is a fracture across the surgical neck of the humerus with at least 5 mm of medial displacement. Probable continuation across the greater tuberosity. No visible head continuation but not well evaluated due to obliquity. No indication of dislocation. Lucency in the distal clavicle appears corticated and chronic. Marked osteopenia. IMPRESSION: 1. Displaced surgical neck fracture of the left humerus, likely with continuation across the greater tuberosity. The fracture would be better evaluated by shoulder series. 2. Marked osteopenia. Electronically Signed   By: Monte Fantasia M.D.   On: 11/21/2015 11:20   ASSESSMENT AND PLAN:  Wesley Mcguire is a 81 y.o. male with a known history of CLLfollowed by the community hosp[facility] since July of this year. Patient presented emergency room after he had sustained a fall at home. He is being admitted with  #1 acute hyperkalemia -Received treatment for it. Potassium is down from 5.8 to 4 - continue to monitor. Patient received IV fluids.  2 hyperglycemia with known history of diabetes mellitus not taking any regimen for diabetes: Start the patient on now sliding scale insulin, basal insulin and request diabetic nurse to see the patient, check hemoglobin A1c.  #3 history of CLL not taking any treatment and followed with the hospice since July: Now patient and the family requesting treatment: -oncology consult. Appreciated. CT of the abdomen and pelvis as recommended. We'll order one.  #4 displaced fracture of proximal end of left humerus:  pain control,continue sling for left shoulder -Await consult ortho  5.remote strokes; unable to use aspirin secondary to anemia  #6 anemia with generalized weakness in the setting of CLL -Status post transfusion of 1 unit packed RBC  #7 History of falls deconditioning physical therapy consult.   Case discussed with Care Management/Social Worker. Management plans discussed with the patient, family and they are in agreement.  CODE STATUS: DO NOT RESUSCITATE  DVT Prophylaxis: SCD teds  TOTAL TIME TAKING CARE OF THIS PATIENT: 35 minutes.  >50% time spent on counselling and coordination of care  POSSIBLE D/C IN 2-3 DAYS, DEPENDING ON CLINICAL CONDITION.   Pao Haffey M.D on 11/22/2015 at 1:05 PM  Between 7am to 6pm - Pager - [phone]  After 6pm go to [email or web address] - password [email or web address] [facility]  Office  [phone]  CC: Primary care physician; Pcp Not In System

## 2015-11-22 NOTE — Progress Notes (Signed)
PT Cancellation Note  Patient Details Name: Wesley Mcguire MRN: [id number] DOB: December 05, 1931   Cancelled Treatment:    Reason Eval/Treat Not Completed: Patient declined, no reason specified (Just reports he doesn't have enough energy, just arrived).  NPO for procedure and will try again tomorrow.   Wesley Mcguire 11/22/2015, 12:35 PM   Wesley Mcguire, PT MS Acute Rehab Dept. Number: [facility] [id number] and [place] [phone]

## 2015-11-22 NOTE — Progress Notes (Addendum)
Initial Nutrition Assessment  DOCUMENTATION CODES:   Severe malnutrition in context of chronic illness  INTERVENTION:   Meals and Snacks: Cater to patient preferences; Will recommend liberalizing diet order to Regular to optimize nutritional intake.  Medical Food Supplement Therapy: recommend Ensure Enlive po TID, each supplement provides 350 kcal and 20 grams of protein. Will also send Magic Cup on lunch and dinner trays.   NUTRITION DIAGNOSIS:   Malnutrition related to chronic illness as evidenced by severe depletion of muscle mass, severe depletion of body fat.  GOAL:   Patient will meet greater than or equal to 90% of their needs  MONITOR:    (Energy Intake, Glucose Profile, Anthropometrics, Digestive System)  REASON FOR ASSESSMENT:   Malnutrition Screening Tool    ASSESSMENT:   Pt admitted s/p fall PTA. Pt with left humerus fracture, orthopeadics following, and hyperkalemia s/p kayexelate given. Pt with h/o CLL.  Pt also noted to have anemis s/p transfusion.  Past Medical History  Diagnosis Date  . Diabetes mellitus without complication ([place])   . Leukemia ([place])     Diet Order:  Diet heart healthy/carb modified Room service appropriate?: Yes; Fluid consistency:: Thin    Current Nutrition: Pt reports eating 100% of breakfast this am with a good appetite but could only remember eating eggs.   Food/Nutrition-Related History: Pt reports good appetite PTA eating 3 meals per day. Pt reports daughter fixes his meals and he eats 3 meals per day prepared by her. Pt reports having had Ensure in the past but none recently.   Scheduled Medications:  . feeding supplement (ENSURE ENLIVE)  237 mL Oral TID WC  . insulin aspart  0-9 Units Subcutaneous TID WC  . insulin aspart  3 Units Subcutaneous TID WC  . insulin detemir  5 Units Subcutaneous QHS    Continuous Medications:  . sodium chloride 75 mL/hr at 11/21/15 1736     Electrolyte/Renal Profile and Glucose Profile:    Recent Labs Lab 11/21/15 1016 11/22/15 0543  NA 140 141  K 5.8* 4.0  CL 103 109  CO2 30 27  BUN 29* 26*  CREATININE 0.62 0.41*  CALCIUM 8.7* 7.9*  GLUCOSE 379* 101*   Protein Profile:  Recent Labs Lab 11/21/15 1016  ALBUMIN 3.6    Gastrointestinal Profile: Last BM:  11/21/2015   Nutrition-Focused Physical Exam Findings: Nutrition-Focused physical exam completed. Findings are severe fat depletion, severe muscle depletion, and no edema.     Weight Change: Pt reports currently weighing 137lbs. Pt actual current weight of 107lbs. (22% weight loss)   Skin:  Reviewed, no issues   Height:   Ht Readings from Last 1 Encounters:  11/21/15 5\' 9"  (1.753 m)    Weight:   Wt Readings from Last 1 Encounters:  11/21/15 107 lb 14.4 oz (48.943 kg)    Ideal Body Weight:   72.7kg  BMI:  Body mass index is 15.93 kg/(m^2).  Estimated Nutritional Needs:   Kcal:  using IBW of 72.7kg, BEE: 1412kcals, TEE: (IF 1.1-1.3)(AF 1.2) [id number]  Protein:  80-95g protein (1.1-1.3g/kg)  Fluid:  1818-2145mL of fluid (25-59mL/kg)  EDUCATION NEEDS:   No education needs identified at this time   [place], RD, LDN Pager [phone]

## 2015-11-22 NOTE — Consult Note (Signed)
ORTHOPAEDIC CONSULTATION  REQUESTING PHYSICIAN: Fritzi Mandes, MD  Chief Complaint:   Left shoulder pain  History of Present Illness: Wesley HUNSAKER is a 81 y.o. male with a history of diabetes and chronic lymphocytic leukemia who lives at home. Apparently, he has been falling more frequently lately. After a fall yesterday, he was brought to the emergency room where he was noted to have an elevated glucose and significantly elevated white blood cell count, as well as anemia. X-rays also demonstrated a varus impacted left humeral neck fracture. He was placed into a shoulder immobilizer and admitted for medical stabilization.  Past Medical History  Diagnosis Date  . Diabetes mellitus without complication ([place])   . Leukemia ([place])    No past surgical history on file. Social History   Social History  . Marital Status: Divorced    Spouse Name: N/A  . Number of Children: N/A  . Years of Education: N/A   Social History Main Topics  . Smoking status: Never Smoker   . Smokeless tobacco: None  . Alcohol Use: No  . Drug Use: None  . Sexual Activity: Not Asked   Other Topics Concern  . None   Social History Narrative   No family history on file. No Known Allergies Prior to Admission medications   Medication Sig Start Date End Date Taking? Authorizing Provider  traMADol (ULTRAM) 50 MG tablet Take 50 mg by mouth every 6 (six) hours as needed for moderate pain or severe pain.    Yes Historical Provider, MD   Dg Chest 1 View  11/21/2015  CLINICAL DATA:  Weakness.  Frequent falls. EXAM: CHEST 1 VIEW COMPARISON:  04/27/2011 FINDINGS: There is a proximal left humerus fracture as described on dedicated humerus radiography. Normal heart size and mediastinal contours. There is no edema, consolidation, effusion, or pneumothorax. Marked osteopenia. Gas under the diaphragm appears intraluminal. There is no indication of abdominal pain.  IMPRESSION: 1. No evidence of acute cardiopulmonary disease. 2. Proximal left humerus fracture. Electronically Signed   By: Monte Fantasia M.D.   On: 11/21/2015 11:24   Ct Head Wo Contrast  11/21/2015  CLINICAL DATA:  Multiple falls.  Weakness. EXAM: CT HEAD WITHOUT CONTRAST TECHNIQUE: Contiguous axial images were obtained from the base of the skull through the vertex without intravenous contrast. COMPARISON:  None. FINDINGS: Bony calvarium appears intact. Left temporal encephalomalacia is noted consistent with old infarction. No mass effect or midline shift is noted. Ventricular size is within normal limits. There is no evidence of mass lesion, hemorrhage or acute infarction. 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 No acute intracranial abnormality seen. Electronically Signed   By: Marijo Conception, M.D.   On: 11/21/2015 11:10   Dg Humerus Left  11/21/2015  CLINICAL DATA:  Several falls over the past few days. Swelling and bruising of the left arm. See LL EXAM: LEFT HUMERUS - 2+ VIEW COMPARISON:  None. FINDINGS: There is a fracture across the surgical neck of the humerus with at least 5 mm of medial displacement. Probable continuation across the greater tuberosity. No visible head continuation but not well evaluated due to obliquity. No indication of dislocation. Lucency in the distal clavicle appears corticated and chronic. Marked osteopenia. IMPRESSION: 1. Displaced surgical neck fracture of the left humerus, likely with continuation across the greater tuberosity. The fracture would be better evaluated by shoulder series. 2. Marked osteopenia. Electronically Signed   By: Monte Fantasia M.D.   On: 11/21/2015 11:20   Positive ROS: All other  systems have been reviewed and were otherwise negative with the exception of those mentioned in the HPI and as above.  Physical Exam: No examination performed as patient not on the floor at present.  X-rays:  X-rays of the left humerus are available for  review. These films demonstrate a varus impacted fracture of the left proximal humerus. The humeral head appears to be located appropriately within the glenoid.  Assessment: Impacted left proximal humerus fracture.  Plan: As the patient is in the radiology department, I cannot examine him at present. However, based on his humerus x-rays, I feel that this injury can be managed nonsurgically. I have ordered more dedicated left shoulder films in order to evaluate the fracture more accurately. Meanwhile, he is to remain in his shoulder immobilizer at all times, removing it only for bathing purposes. I expect that he will need to be in the immobilizer for about 3 weeks before he may begin some therapy to work on shoulder range of motion exercises. He may use his hand and wrist freely for simple activities, but is not to support his weight through his left upper extremity.  Thank you for asking me to participate in the care of this man. I will come by again in the morning or later this afternoon to check on him.   Pascal Lux, MD  Beeper #:  [phone]  11/22/2015 1:04 PM

## 2015-11-22 NOTE — Progress Notes (Signed)
Pt to be transferred to rm [id number]. Report called to [facility]. Pt has completed his ct prep. Npo for procedure.

## 2015-11-23 DIAGNOSIS — Z9221 Personal history of antineoplastic chemotherapy: Secondary | ICD-10-CM

## 2015-11-23 DIAGNOSIS — R531 Weakness: Secondary | ICD-10-CM

## 2015-11-23 DIAGNOSIS — Z79899 Other long term (current) drug therapy: Secondary | ICD-10-CM

## 2015-11-23 DIAGNOSIS — D649 Anemia, unspecified: Secondary | ICD-10-CM

## 2015-11-23 DIAGNOSIS — Z794 Long term (current) use of insulin: Secondary | ICD-10-CM

## 2015-11-23 DIAGNOSIS — D696 Thrombocytopenia, unspecified: Secondary | ICD-10-CM

## 2015-11-23 DIAGNOSIS — C9112 Chronic lymphocytic leukemia of B-cell type in relapse: Secondary | ICD-10-CM

## 2015-11-23 DIAGNOSIS — R591 Generalized enlarged lymph nodes: Secondary | ICD-10-CM

## 2015-11-23 DIAGNOSIS — W19XXXS Unspecified fall, sequela: Secondary | ICD-10-CM

## 2015-11-23 DIAGNOSIS — S42302S Unspecified fracture of shaft of humerus, left arm, sequela: Secondary | ICD-10-CM

## 2015-11-23 DIAGNOSIS — M25512 Pain in left shoulder: Secondary | ICD-10-CM

## 2015-11-23 DIAGNOSIS — E119 Type 2 diabetes mellitus without complications: Secondary | ICD-10-CM

## 2015-11-23 DIAGNOSIS — Z9181 History of falling: Secondary | ICD-10-CM

## 2015-11-23 LAB — GLUCOSE, CAPILLARY
GLUCOSE-CAPILLARY: 369 mg/dL — AB (ref 65–99)
GLUCOSE-CAPILLARY: 460 mg/dL — AB (ref 65–99)
Glucose-Capillary: 223 mg/dL — ABNORMAL HIGH (ref 65–99)
Glucose-Capillary: 163 mg/dL — ABNORMAL HIGH (ref 65–99)
Glucose-Capillary: 346 mg/dL — ABNORMAL HIGH (ref 65–99)

## 2015-11-23 MED ORDER — MORPHINE SULFATE (PF) 2 MG/ML IV SOLN
1.0000 mg | INTRAVENOUS | Status: DC | PRN
  Administered 2015-11-24 – 2015-11-25 (×2): 1 mg via INTRAVENOUS
  Filled 2015-11-23 (×2): qty 1

## 2015-11-23 MED ORDER — INSULIN DETEMIR 100 UNIT/ML ~~LOC~~ SOLN
10.0000 [IU] | Freq: Every day | SUBCUTANEOUS | Status: DC
  Administered 2015-11-23 – 2015-11-24 (×2): 10 [IU] via SUBCUTANEOUS
  Filled 2015-11-23 (×3): qty 0.1

## 2015-11-23 MED ORDER — MORPHINE SULFATE (PF) 2 MG/ML IV SOLN
2.0000 mg | INTRAVENOUS | Status: AC
  Administered 2015-11-23: 2 mg via INTRAVENOUS
  Filled 2015-11-23: qty 1

## 2015-11-23 NOTE — Progress Notes (Signed)
Patient ID: Wesley Mcguire, male   DOB: February 02, 1931, 81 y.o.   MRN: [id number] [place] [facility] [place] NAME: Wesley Mcguire    MR#:  [id number]  DATE OF BIRTH:  08/22/1931  SUBJECTIVE:  Came in after a fall at home. Found to have elevated K Weak, very deconditioned  REVIEW OF SYSTEMS:   Review of Systems  Constitutional: Negative for fever, chills and weight loss.  HENT: Negative for ear discharge, ear pain and nosebleeds.   Eyes: Negative for blurred vision, pain and discharge.  Respiratory: Negative for sputum production, shortness of breath, wheezing and stridor.   Cardiovascular: Negative for chest pain, palpitations, orthopnea and PND.  Gastrointestinal: Negative for nausea, vomiting, abdominal pain and diarrhea.  Genitourinary: Negative for urgency and frequency.  Musculoskeletal: Positive for back pain and falls. Negative for joint pain.  Neurological: Positive for weakness. Negative for sensory change, speech change and focal weakness.  Psychiatric/Behavioral: Negative for depression and hallucinations. The patient is not nervous/anxious.   All other systems reviewed and are negative.  Tolerating Diet:yes Tolerating PT: pending  DRUG ALLERGIES:  No Known Allergies  VITALS:  Blood pressure 126/66, pulse 124, temperature 97.7 F (36.5 C), temperature source Oral, resp. rate 20, height 5\' 9"  (1.753 m), weight 107 lb 14.4 oz (48.943 kg), SpO2 96 %.  PHYSICAL EXAMINATION:   Physical Exam  GENERAL:  81 y.o.-year-old patient lying in the bed with no acute distress. Thin,cachectic EYES: Pupils equal, round, reactive to light and accommodation. No scleral icterus. Extraocular muscles intact.  HEENT: Head atraumatic, normocephalic. Oropharynx and nasopharynx clear.  NECK:  Supple, no jugular venous distention. No thyroid enlargement, no tenderness.  LUNGS: Normal breath sounds bilaterally, no wheezing, rales, rhonchi. No use of  accessory muscles of respiration.  CARDIOVASCULAR: S1, S2 normal. No murmurs, rubs, or gallops.  ABDOMEN: Soft, nontender, nondistended. Bowel sounds present. No organomegaly or mass.  EXTREMITIES: No cyanosis, clubbing or edema b/l.   Left shoulder sling NEUROLOGIC: Cranial nerves II through XII are intact. No focal Motor or sensory deficits b/l.   PSYCHIATRIC:  patient is alert and oriented x 3.  SKIN: No obvious rash, lesion, or ulcer.   LABORATORY PANEL:   CBC  Recent Labs Lab 11/22/15 0543 11/22/15 0744  WBC 138.5*  --   HGB 7.8* 8.0*  HCT 26.2* 24.2*  PLT 88*  --     Chemistries   Recent Labs Lab 11/21/15 1016 11/22/15 0543  NA 140 141  K 5.8* 4.0  CL 103 109  CO2 30 27  GLUCOSE 379* 101*  BUN 29* 26*  CREATININE 0.62 0.41*  CALCIUM 8.7* 7.9*  AST 25  --   ALT 16*  --   ALKPHOS 137*  --   BILITOT 0.6  --     Cardiac Enzymes  Recent Labs Lab 11/21/15 1016  TROPONINI <0.03    RADIOLOGY:  Ct Abdomen Pelvis W Contrast  11/22/2015  CLINICAL DATA:  CLL.  Generalized weakness.  Inpatient. EXAM: CT ABDOMEN AND PELVIS WITH CONTRAST TECHNIQUE: Multidetector CT imaging of the abdomen and pelvis was performed using the standard protocol following bolus administration of intravenous contrast. CONTRAST:  169mL OMNIPAQUE IOHEXOL 300 MG/ML  SOLN COMPARISON:  04/28/2011 CT abdomen/ pelvis. FINDINGS: Lower chest: Small layering left pleural effusion. Mild passive atelectasis in the dependent lower lobe bases. Right, left anterior descending and left circumflex coronary atherosclerosis. Aortic valvular calcification. Hepatobiliary: Normal liver with no liver mass. The gallbladder  is not visualized and is likely surgically absent. No biliary ductal dilatation. Pancreas: Severe diffuse atrophy of the pancreas, with interval worsening. No pancreatic mass. Main pancreatic duct is top normal caliber throughout. Spleen: Craniocaudal splenic length is 10.6 cm, within normal limits.  There are 2 subcentimeter hypodense splenic lesions, too small to characterize, not seen on the prior CT. Adrenals/Urinary Tract: Normal adrenals. Normal kidneys with no hydronephrosis and no renal mass. Normal bladder. Stomach/Bowel: Grossly normal stomach. There are mildly-to-moderately dilated mid to distal small bowel loops in the right abdomen, with evidence of prior small bowel resection indicated by anastomotic suture line within a dilated right abdominal small bowel loop. Oral contrast progresses to the distal small bowel. There is stool within the normal caliber distal and terminal ileum. No small bowel wall thickening. Appendix is not discretely visualized. Moderate stool throughout the colon and rectum. No large bowel wall thickening or pericolonic fat stranding. Vascular/Lymphatic: Atherosclerotic nonaneurysmal abdominal aorta. Patent portal, splenic, hepatic and renal veins. There is bulky confluent retroperitoneal and bilateral pelvic lymphadenopathy throughout the aortocaval, left periaortic, bilateral common iliac, bilateral internal and external iliac and bilateral inguinal nodal territories, mildly increased since 04/28/2011. For example, a 1.6 cm aortocaval node (series 9/ image 46) is increased from 1.4 cm. A 1.8 cm left inguinal node (9/80) is increased from 1.2 cm. A 2.4 cm left common iliac node (9/54) is increased from 2.1 cm. There is new mild to moderate central mesenteric adenopathy, with a dominant 1.9 cm right mesenteric node (9/33). Reproductive: Normal size prostate. Other: No pneumoperitoneum, ascites or focal fluid collection. Musculoskeletal: No aggressive appearing focal osseous lesions. Moderate degenerative changes throughout the visualized thoracolumbar spine. There is a diffuse mottled appearance of the bones. There is cachexia and mild anasarca. IMPRESSION: 1. Bulky confluent retroperitoneal, mesenteric and bilateral pelvic lymphadenopathy, mildly increased since 04/28/2011.  2. Cachexia and mild anasarca. 3. Diffuse mottled appearance of the bones, which is nonspecific and could represent osteopenia, although diffuse lymphoproliferative involvement of the marrow cannot be excluded. 4. Small layering left pleural effusion. 5. Two new subcentimeter hypodense splenic lesions, cannot exclude splenic lymphoma. No splenomegaly. 6. Mild-to-moderate dilatation of the mid to distal small bowel, with evidence of prior small bowel resection. Stool in the nondilated distal small bowel. Moderate stool throughout the colon. No focal bowel caliber transition. No bowel wall thickening. Findings are most suggestive of diffuse adynamic ileus. Electronically Signed   By: Ilona Sorrel M.D.   On: 11/22/2015 13:18   Dg Shoulder Left  11/22/2015  CLINICAL DATA:  Known left shoulder fracture.  Initial encounter. EXAM: LEFT SHOULDER - 2+ VIEW COMPARISON:  None. FINDINGS: There is a surgical neck fracture of the left humerus with posterior impaction and ventral displacement. There is continuation at least to the base of the greater tuberosity without visualized displacement at this level. The glenohumeral joint is located. Intact, degenerated acromioclavicular joint. Marked osteopenia. IMPRESSION: Displaced surgical neck humerus fracture. Electronically Signed   By: Monte Fantasia M.D.   On: 11/22/2015 13:40   ASSESSMENT AND PLAN:  Glendel Dondiego is a 81 y.o. male with a known history of CLLfollowed by the [facility] since July of this year. Patient presented emergency room after he had sustained a fall at home. He is being admitted with  #1 acute hyperkalemia -Received treatment for it. Potassium is down from 5.8 to 4 - continue to monitor. Patient received IV fluids.  2 hyperglycemia with known history of diabetes mellitus not taking any regimen  for diabetes: Start the patient on now sliding scale insulin, basal insulin and request diabetic nurse to see the patient -hemoglobin  A1c10-11  #3 history of CLL not taking any treatment and followed with the hospice since July: Now patient and the family requesting treatment: -oncology consult. Appreciated. CT of the abdomen and pelvis as recommended. -further eval as outpt per oncology  #4 displaced fracture of proximal end of left humerus: pain control,continue sling for left shoulder -immobiliser for 3-4 week. Non-surgical treatment.  5.remote strokes; unable to use aspirin secondary to anemia  #6 anemia with generalized weakness in the setting of CLL -Status post transfusion of 1 unit packed RBC  #7 History of falls deconditioning physical therapy consult.   CSW for d/c planning  Case discussed with Care Management/Social Worker. Management plans discussed with the patient, family and they are in agreement.  CODE STATUS: DO NOT RESUSCITATE  DVT Prophylaxis: SCD teds  TOTAL TIME TAKING CARE OF THIS PATIENT: 35 minutes.  >50% time spent on counselling and coordination of care  POSSIBLE D/C IN 2-3 DAYS, DEPENDING ON CLINICAL CONDITION.   Nandi Tonnesen M.D on 11/23/2015 at 3:41 PM  Between 7am to 6pm - Pager - [phone]  After 6pm go to [email or web address] - password [email or web address]  Office  [phone]  CC: Primary care physician; Pcp Not In System

## 2015-11-23 NOTE — Progress Notes (Signed)
[place]  [phone] [phone]  ID: Zane Herald OB: 03-06-1931  MR#: [id number]  [id number]  Patient Care Team: Pcp Not In System as PCP - General  CHIEF COMPLAINT:  Chief Complaint  Patient presents with  . Fall    INTERVAL HISTORY: Patient resting comfortably. No family at bedside. He continues to complain of mild left shoulder pain, but offers no further specific complaints.  REVIEW OF SYSTEMS:   Review of Systems  Constitutional: Negative.   Respiratory: Negative.   Cardiovascular: Negative.   Gastrointestinal: Negative.   Musculoskeletal: Positive for joint pain.  Neurological: Negative.     As per HPI. Otherwise, a complete review of systems is negatve.  PAST MEDICAL HISTORY: Past Medical History  Diagnosis Date  . Diabetes mellitus without complication ([place])   . Leukemia ([place])     PAST SURGICAL HISTORY: No past surgical history on file.  FAMILY HISTORY: Reviewed and unchanged. No reported history of malignancy or chronic disease.     ADVANCED DIRECTIVES:    HEALTH MAINTENANCE: Social History  Substance Use Topics  . Smoking status: Never Smoker   . Smokeless tobacco: None  . Alcohol Use: No     Colonoscopy:  PAP:  Bone density:  Lipid panel:  No Known Allergies  Current Facility-Administered Medications  Medication Dose Route Frequency Provider Last Rate Last Dose  . 0.9 %  sodium chloride infusion   Intravenous Continuous Epifanio Lesches, MD 75 mL/hr at 11/23/15 0816    . acetaminophen (TYLENOL) tablet 650 mg  650 mg Oral Q6H PRN Epifanio Lesches, MD   650 mg at 11/23/15 0816   Or  . acetaminophen (TYLENOL) suppository 650 mg  650 mg Rectal Q6H PRN Epifanio Lesches, MD      . alum & mag hydroxide-simeth (MAALOX/MYLANTA) 200-200-20 MG/5ML suspension 30 mL  30 mL Oral Q6H PRN Epifanio Lesches, MD      . feeding supplement (ENSURE ENLIVE) (ENSURE ENLIVE) liquid 237 mL  237 mL  Oral TID WC Fritzi Mandes, MD   237 mL at 11/23/15 [id number]  . insulin aspart (novoLOG) injection 0-9 Units  0-9 Units Subcutaneous TID WC Epifanio Lesches, MD   3 Units at 11/23/15 [phone]  . insulin aspart (novoLOG) injection 3 Units  3 Units Subcutaneous TID WC Epifanio Lesches, MD   3 Units at 11/23/15 [phone]  . insulin detemir (LEVEMIR) injection 5 Units  5 Units Subcutaneous QHS Epifanio Lesches, MD   5 Units at 11/22/15 2232  . ondansetron (ZOFRAN) tablet 4 mg  4 mg Oral Q6H PRN Epifanio Lesches, MD       Or  . ondansetron (ZOFRAN) injection 4 mg  4 mg Intravenous Q6H PRN Epifanio Lesches, MD      . senna-docusate (Senokot-S) tablet 1 tablet  1 tablet Oral QHS PRN Epifanio Lesches, MD        OBJECTIVE: Filed Vitals:   11/22/15 2131 11/23/15 0533  BP: 96/44 138/73  Pulse: 78 93  Temp: 97.5 F (36.4 C) 98.3 F (36.8 C)  Resp: 18 17     Body mass index is 15.93 kg/(m^2).    ECOG FS:1 - Symptomatic but completely ambulatory  General: Well-developed, well-nourished, no acute distress. Eyes: Pink conjunctiva, anicteric sclera. Lungs: Clear to auscultation bilaterally. Heart: Regular rate and rhythm. No rubs, murmurs, or gallops. Abdomen: Soft, nontender, nondistended. No organomegaly noted, normoactive bowel sounds. Musculoskeletal: Left arm and shoulder immobilized. Neuro: Alert, answering all questions appropriately. Cranial nerves grossly intact. Skin: No  rashes or petechiae noted. Psych: Normal affect.   LAB RESULTS:  Lab Results  Component Value Date   NA 141 11/22/2015   K 4.0 11/22/2015   CL 109 11/22/2015   CO2 27 11/22/2015   GLUCOSE 101* 11/22/2015   BUN 26* 11/22/2015   CREATININE 0.41* 11/22/2015   CALCIUM 7.9* 11/22/2015   PROT 5.9* 11/21/2015   ALBUMIN 3.6 11/21/2015   AST 25 11/21/2015   ALT 16* 11/21/2015   ALKPHOS 137* 11/21/2015   BILITOT 0.6 11/21/2015   GFRNONAA >60 11/22/2015   GFRAA >60 11/22/2015    Lab Results  Component Value Date    WBC 138.5* 11/22/2015   NEUTROABS 17.5* 11/21/2015   HGB 8.0* 11/22/2015   HCT 24.2* 11/22/2015   MCV 92.3 11/22/2015   PLT 88* 11/22/2015     STUDIES: Dg Chest 1 View  11/21/2015  CLINICAL DATA:  Weakness.  Frequent falls. EXAM: CHEST 1 VIEW COMPARISON:  04/27/2011 FINDINGS: There is a proximal left humerus fracture as described on dedicated humerus radiography. Normal heart size and mediastinal contours. There is no edema, consolidation, effusion, or pneumothorax. Marked osteopenia. Gas under the diaphragm appears intraluminal. There is no indication of abdominal pain. IMPRESSION: 1. No evidence of acute cardiopulmonary disease. 2. Proximal left humerus fracture. Electronically Signed   By: Monte Fantasia M.D.   On: 11/21/2015 11:24   Ct Head Wo Contrast  11/21/2015  CLINICAL DATA:  Multiple falls.  Weakness. EXAM: CT HEAD WITHOUT CONTRAST TECHNIQUE: Contiguous axial images were obtained from the base of the skull through the vertex without intravenous contrast. COMPARISON:  None. FINDINGS: Bony calvarium appears intact. Left temporal encephalomalacia is noted consistent with old infarction. No mass effect or midline shift is noted. Ventricular size is within normal limits. There is no evidence of mass lesion, hemorrhage or acute infarction. 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 No acute intracranial abnormality seen. Electronically Signed   By: Marijo Conception, M.D.   On: 11/21/2015 11:10   Ct Abdomen Pelvis W Contrast  11/22/2015  CLINICAL DATA:  CLL.  Generalized weakness.  Inpatient. EXAM: CT ABDOMEN AND PELVIS WITH CONTRAST TECHNIQUE: Multidetector CT imaging of the abdomen and pelvis was performed using the standard protocol following bolus administration of intravenous contrast. CONTRAST:  152mL OMNIPAQUE IOHEXOL 300 MG/ML  SOLN COMPARISON:  04/28/2011 CT abdomen/ pelvis. FINDINGS: Lower chest: Small layering left pleural effusion. Mild passive atelectasis in the dependent  lower lobe bases. Right, left anterior descending and left circumflex coronary atherosclerosis. Aortic valvular calcification. Hepatobiliary: Normal liver with no liver mass. The gallbladder is not visualized and is likely surgically absent. No biliary ductal dilatation. Pancreas: Severe diffuse atrophy of the pancreas, with interval worsening. No pancreatic mass. Main pancreatic duct is top normal caliber throughout. Spleen: Craniocaudal splenic length is 10.6 cm, within normal limits. There are 2 subcentimeter hypodense splenic lesions, too small to characterize, not seen on the prior CT. Adrenals/Urinary Tract: Normal adrenals. Normal kidneys with no hydronephrosis and no renal mass. Normal bladder. Stomach/Bowel: Grossly normal stomach. There are mildly-to-moderately dilated mid to distal small bowel loops in the right abdomen, with evidence of prior small bowel resection indicated by anastomotic suture line within a dilated right abdominal small bowel loop. Oral contrast progresses to the distal small bowel. There is stool within the normal caliber distal and terminal ileum. No small bowel wall thickening. Appendix is not discretely visualized. Moderate stool throughout the colon and rectum. No large bowel wall thickening or pericolonic fat stranding.  Vascular/Lymphatic: Atherosclerotic nonaneurysmal abdominal aorta. Patent portal, splenic, hepatic and renal veins. There is bulky confluent retroperitoneal and bilateral pelvic lymphadenopathy throughout the aortocaval, left periaortic, bilateral common iliac, bilateral internal and external iliac and bilateral inguinal nodal territories, mildly increased since 04/28/2011. For example, a 1.6 cm aortocaval node (series 9/ image 46) is increased from 1.4 cm. A 1.8 cm left inguinal node (9/80) is increased from 1.2 cm. A 2.4 cm left common iliac node (9/54) is increased from 2.1 cm. There is new mild to moderate central mesenteric adenopathy, with a dominant 1.9 cm  right mesenteric node (9/33). Reproductive: Normal size prostate. Other: No pneumoperitoneum, ascites or focal fluid collection. Musculoskeletal: No aggressive appearing focal osseous lesions. Moderate degenerative changes throughout the visualized thoracolumbar spine. There is a diffuse mottled appearance of the bones. There is cachexia and mild anasarca. IMPRESSION: 1. Bulky confluent retroperitoneal, mesenteric and bilateral pelvic lymphadenopathy, mildly increased since 04/28/2011. 2. Cachexia and mild anasarca. 3. Diffuse mottled appearance of the bones, which is nonspecific and could represent osteopenia, although diffuse lymphoproliferative involvement of the marrow cannot be excluded. 4. Small layering left pleural effusion. 5. Two new subcentimeter hypodense splenic lesions, cannot exclude splenic lymphoma. No splenomegaly. 6. Mild-to-moderate dilatation of the mid to distal small bowel, with evidence of prior small bowel resection. Stool in the nondilated distal small bowel. Moderate stool throughout the colon. No focal bowel caliber transition. No bowel wall thickening. Findings are most suggestive of diffuse adynamic ileus. Electronically Signed   By: Ilona Sorrel M.D.   On: 11/22/2015 13:18   Dg Shoulder Left  11/22/2015  CLINICAL DATA:  Known left shoulder fracture.  Initial encounter. EXAM: LEFT SHOULDER - 2+ VIEW COMPARISON:  None. FINDINGS: There is a surgical neck fracture of the left humerus with posterior impaction and ventral displacement. There is continuation at least to the base of the greater tuberosity without visualized displacement at this level. The glenohumeral joint is located. Intact, degenerated acromioclavicular joint. Marked osteopenia. IMPRESSION: Displaced surgical neck humerus fracture. Electronically Signed   By: Monte Fantasia M.D.   On: 11/22/2015 13:40   Dg Humerus Left  11/21/2015  CLINICAL DATA:  Several falls over the past few days. Swelling and bruising of the  left arm. See LL EXAM: LEFT HUMERUS - 2+ VIEW COMPARISON:  None. FINDINGS: There is a fracture across the surgical neck of the humerus with at least 5 mm of medial displacement. Probable continuation across the greater tuberosity. No visible head continuation but not well evaluated due to obliquity. No indication of dislocation. Lucency in the distal clavicle appears corticated and chronic. Marked osteopenia. IMPRESSION: 1. Displaced surgical neck fracture of the left humerus, likely with continuation across the greater tuberosity. The fracture would be better evaluated by shoulder series. 2. Marked osteopenia. Electronically Signed   By: Monte Fantasia M.D.   On: 11/21/2015 11:20    ASSESSMENT: CLL. Patient admitted to the hospital with generalized weakness and multiple falls.  PLAN:    1. CLL: Patient's white blood cell count from today is mildly improved at 138. He is slightly more anemic and thrombocytopenic. CT scan results reviewed independently and reported as above with lymphadenopathy. 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 Patient's primary oncologist Dr. Rogue Bussing discussed case at length with patient's son Annie Main previously. No intervention needed at this time. 2. Generalized weakness: Unclear etiology, unlikely related to progression of CLL. 3. Humerus fracture: If any  surgical intervention is needed, okay to proceed from a hematology standpoint.  Will follow.  Lloyd Huger, MD   11/23/2015 10:40 AM

## 2015-11-23 NOTE — Progress Notes (Signed)
Pts only complaint was shoulder pain, new orders placed and prn meds given with relief.

## 2015-11-23 NOTE — Progress Notes (Signed)
Subjective: The patient complains of moderate left shoulder pain, especially if he tries to move the arm, but denies any numbness or paresthesias to his left upper extremity or hand. He is right-handed.   Objective: Vital signs in last 24 hours: Temp:  [97.5 F (36.4 C)-98.3 F (36.8 C)] 98.3 F (36.8 C) (12/04 0533) Pulse Rate:  [78-93] 93 (12/04 0533) Resp:  [17-18] 17 (12/04 0533) BP: (96-138)/(44-73) 138/73 mmHg (12/04 0533) SpO2:  [95 %-99 %] 99 % (12/04 0533)  Intake/Output from previous day: 12/03 0701 - 12/04 0700 In: 240 [P.O.:240] Out: 300 [Urine:300] Intake/Output this shift: Total I/O In: 240 [P.O.:240] Out: -    Recent Labs  11/21/15 1016 11/21/15 1809 11/22/15 0543 11/22/15 0744  HGB 7.8* 7.2* 7.8* 8.0*    Recent Labs  11/21/15 1016  11/22/15 0543 11/22/15 0744  WBC 175.4*  --  138.5*  --   RBC 2.74*  --  2.84*  --   HCT 24.4*  < > 26.2* 24.2*  PLT 106*  --  88*  --   < > = values in this interval not displayed.  Recent Labs  11/21/15 1016 11/22/15 0543  NA 140 141  K 5.8* 4.0  CL 103 109  CO2 30 27  BUN 29* 26*  CREATININE 0.62 0.41*  GLUCOSE 379* 101*  CALCIUM 8.7* 7.9*   No results for input(s): LABPT, INR in the last 72 hours.  Physical Exam: Orthopedic examination is limited to his left shoulder and upper extremity. He has moderate swelling around the shoulder with early ecchymosis anterolaterally. There is no evidence for lacerations, erythema, or rashes around the shoulder area. He has tenderness to palpation over the anterolateral shoulder region. This pain is reproduced with any attempted active or passive motion of the shoulder. He is able to actively flex and extend all digits, as well as to flex and extend his wrist. He also demonstrates limited and painful but present active flexion of the elbow. He has intact sensation to light touch to all distributions, including both the axillary nerve and musculocutaneous nerve distributions.  He has good capillary refill to all digits of his left hand.  X-rays: AP and Y scapular views of the left shoulder are available for review. These films demonstrate a varus-impacted fracture of the left proximal humerus with some posterior rotation/displacement of the humeral head. However, the humeral head remains well located within the glenoid. No significant degenerative changes or lytic lesions are identified.  Assessment: Impacted left proximal humerus fracture.  Plan: As discussed yesterday, this fracture would best be managed nonsurgically. Presently, he is in a shoulder sling. I would prefer that he be in a shoulder immobilizer, so this has been ordered. He is to remain in this immobilizer for 3-4 weeks before we begin physical therapy to work on gentle range of motion exercises. He may use his hand for holding and grasping light objects, but is not to move his arm away from his body or try to weight-bear through the left upper extremity.  Thank you for ask me to participate in the care of this most pleasant man. I will be happy to see him back in my office in 2-3 weeks for repeat x-rays of his left shoulder.   Wesley Mcguire 11/23/2015, 1:38 PM

## 2015-11-23 NOTE — Evaluation (Signed)
Physical Therapy Evaluation Patient Details Name: Wesley Mcguire MRN: [id number] DOB: June 19, 1931 Today's Date: 11/23/2015   History of Present Illness  81 yo  male with onset of leukemia, osteopenia, anasarca and pleural effusiion also has an adynamic ileus with fall at home creating a L humeral surgical neck fracture.  MD has decided not to repair fracture, asked to leave in sling.  Clinical Impression  Pt is so weak at evaluation he cannot support himself in sitting.  Attempted to get to stand with hemiwalker but could not sit long enough with mult attempts.  Has light headed feeling and cannot assess BP as he feels too poorly to test there.  Will recommend inpt therapy to complete the recovery process to see if returning home is possible    Follow Up Recommendations SNF    Equipment Recommendations  None recommended by PT (await SNF to decide, pt not sure of what he has)    Recommendations for Other Services Rehab consult     Precautions / Restrictions Precautions Precautions: Shoulder;Other (comment) (NWB LUE through elbow and may open hand) Type of Shoulder Precautions: in sling at all times Shoulder Interventions: Shoulder sling/immobilizer;At all times Precaution Comments: instructed pt not to put pressure through LUE Restrictions Weight Bearing Restrictions: Yes Other Position/Activity Restrictions: NWB LUE      Mobility  Bed Mobility Overal bed mobility: Needs Assistance Bed Mobility: Supine to Sit;Sit to Supine     Supine to sit: Mod assist Sit to supine: Mod assist;HOB elevated (has pivoted on hips with help for LE's)   General bed mobility comments: Pt cannot use LUE and is struggling to maneuver with one arm and general weakness from CA and poor intake  Transfers                 General transfer comment: Pt attempted to stay up to stand but became sick on his stomach, light headed  Ambulation/Gait             General Gait Details: unable to  stand  Stairs            Wheelchair Mobility    Modified Rankin (Stroke Patients Only)       Balance Overall balance assessment: Needs assistance   Sitting balance-Leahy Scale: Poor                                       Pertinent Vitals/Pain Pain Assessment: Faces Pain Score: 6  Pain Location: L shoulder Pain Descriptors / Indicators: Aching Pain Intervention(s): Monitored during session;Limited activity within patient's tolerance;Premedicated before session;Repositioned    Home Living Family/patient expects to be discharged to:: Private residence Living Arrangements: Children Available Help at Discharge: Family;Available 24 hours/day Type of Home: House       Home Layout: One level Home Equipment: Other (comment) (Pt is a little confused and cannot determine what he has)      Prior Function Level of Independence: Independent with assistive device(s)         Comments: Pt thinks he was on a walker with 2 wheels     Hand Dominance        Extremity/Trunk Assessment   Upper Extremity Assessment: LUE deficits/detail       LUE Deficits / Details: in sling due to humeral fracture   Lower Extremity Assessment: Generalized weakness      Cervical / Trunk Assessment: Kyphotic  Communication  Communication: No difficulties  Cognition Arousal/Alertness: Lethargic Behavior During Therapy: Anxious Overall Cognitive Status: Impaired/Different from baseline Area of Impairment: Problem solving;Memory     Memory: Decreased recall of precautions;Decreased short-term memory       Problem Solving: Slow processing;Decreased initiation;Requires verbal cues General Comments: Pt has been in a great deal of pain with medical issues as well, has been unable to get up to OOB in last day as a result.    General Comments General comments (skin integrity, edema, etc.): Pt is in bed with fragile appearance and is not able to get up to stand,  appropriate for SNF as he cannot hold himself in a sitting position yet    Exercises        Assessment/Plan    PT Assessment Patient needs continued PT services  PT Diagnosis Generalized weakness;Acute pain   PT Problem List Decreased strength;Decreased range of motion;Decreased balance;Decreased activity tolerance;Decreased mobility;Decreased coordination;Decreased cognition;Decreased knowledge of use of DME;Decreased safety awareness;Cardiopulmonary status limiting activity;Decreased knowledge of precautions;Decreased skin integrity;Pain  PT Treatment Interventions DME instruction;Gait training;Functional mobility training;Therapeutic activities;Therapeutic exercise;Balance training;Neuromuscular re-education;Patient/family education;Cognitive remediation   PT Goals (Current goals can be found in the Care Plan section) Acute Rehab PT Goals Patient Stated Goal: to walk again PT Goal Formulation: With patient Time For Goal Achievement: 12/07/15 Potential to Achieve Goals: Fair    Frequency Min 2X/week   Barriers to discharge Decreased caregiver support;Other (comment) (has 2 person need for movement) dependent to transfer when PT assessed    Co-evaluation               End of Session Equipment Utilized During Treatment: Oxygen Activity Tolerance: Patient limited by lethargy;Patient limited by pain;Patient limited by fatigue Patient left: in bed;with call bell/phone within reach;with bed alarm set Nurse Communication: Mobility status;Other (comment) (conveyed to MD as well)         Time: [id number] PT Time Calculation (min) (ACUTE ONLY): 31 min   Charges:   PT Evaluation $Initial PT Evaluation Tier I: 1 Procedure PT Treatments $Therapeutic Activity: 8-22 mins   PT G Codes:        Ramond Dial 12/18/2015, 3:45 PM   Mee Hives, PT MS Acute Rehab Dept. Number: [phone] [phone] and [place] [phone]

## 2015-11-23 NOTE — Plan of Care (Signed)
Problem: Bowel/Gastric: Goal: Will not experience complications related to bowel motility Outcome: Progressing Pt has been resting comfortably during shift. Pt requested a heat pad for his shoulder. Pt was administered a warm blanket. Pt report comfort with the blanket. Pt had an incontinent episode during the shift. No other signs of distress noted. Will continue to monitor.

## 2015-11-24 DIAGNOSIS — R0600 Dyspnea, unspecified: Secondary | ICD-10-CM

## 2015-11-24 DIAGNOSIS — R231 Pallor: Secondary | ICD-10-CM

## 2015-11-24 DIAGNOSIS — K089 Disorder of teeth and supporting structures, unspecified: Secondary | ICD-10-CM

## 2015-11-24 LAB — GLUCOSE, CAPILLARY
GLUCOSE-CAPILLARY: 155 mg/dL — AB (ref 65–99)
GLUCOSE-CAPILLARY: 279 mg/dL — AB (ref 65–99)
GLUCOSE-CAPILLARY: 324 mg/dL — AB (ref 65–99)
Glucose-Capillary: 224 mg/dL — ABNORMAL HIGH (ref 65–99)

## 2015-11-24 LAB — CBC
HCT: 24.9 % — ABNORMAL LOW (ref 40.0–52.0)
HEMOGLOBIN: 7.5 g/dL — AB (ref 13.0–18.0)
MCH: 27.8 pg (ref 26.0–34.0)
MCHC: 30.1 g/dL — AB (ref 32.0–36.0)
MCV: 92.3 fL (ref 80.0–100.0)
PLATELETS: 84 10*3/uL — AB (ref 150–440)
RBC: 2.7 MIL/uL — AB (ref 4.40–5.90)
RDW: 14.5 % (ref 11.5–14.5)
WBC: 121.6 10*3/uL (ref 3.8–10.6)

## 2015-11-24 MED ORDER — SODIUM CHLORIDE 0.9 % IV SOLN
Freq: Once | INTRAVENOUS | Status: AC
  Administered 2015-11-24: 18:00:00 via INTRAVENOUS

## 2015-11-24 MED ORDER — TRAMADOL HCL 50 MG PO TABS
50.0000 mg | ORAL_TABLET | Freq: Four times a day (QID) | ORAL | Status: DC | PRN
  Administered 2015-11-25: 04:00:00 50 mg via ORAL
  Filled 2015-11-24: qty 1

## 2015-11-24 NOTE — Progress Notes (Signed)
Note that patient's daughter will need teaching regarding insulin administration.  Spoke with Education officer, museum regarding d/c plan. She states that pt. will likely d/c to facility for short term and then eventually back home with daughter.   Attempted to demonstrate insulin pen to daughter however she was not in patient's room.  Will re-assess on 12/6 and attempt teaching with patient's daughter.  Note that insulin pen would likely be easiest for insulin administration.    Thanks, Adah Perl, RN, BC-ADM Inpatient Diabetes Coordinator Pager [phone] (8a-5p)

## 2015-11-24 NOTE — Progress Notes (Signed)
   11/24/15 0945  Clinical Encounter Type  Visited With Patient  Visit Type Initial  Provided pastoral presence and support to patient in room.  Pt said he was doing better than before.  [place] [phone]

## 2015-11-24 NOTE — Progress Notes (Signed)
Subjective: No new complaints. The patient still notes moderate pain in his left shoulder, especially with any movement of the shoulder. He denies any numbness or paresthesias to his left hand or forearm.   Objective: Vital signs in last 24 hours: Temp:  [98.4 F (36.9 C)-99.5 F (37.5 C)] 98.6 F (37 C) (12/05 1326) Pulse Rate:  [92-105] 98 (12/05 1326) Resp:  [16-18] 18 (12/05 1326) BP: (110-119)/(51-61) 110/53 mmHg (12/05 1326) SpO2:  [95 %-97 %] 95 % (12/05 1326)  Intake/Output from previous day: 12/04 0701 - 12/05 0700 In: 600 [P.O.:600] Out: -  Intake/Output this shift: Total I/O In: 875 [P.O.:120; I.V.:755] Out: -    Recent Labs  11/21/15 1809 11/22/15 0543 11/22/15 0744 11/24/15 1328  HGB 7.2* 7.8* 8.0* 7.5*    Recent Labs  11/22/15 0543 11/22/15 0744 11/24/15 1328  WBC 138.5*  --  121.6*  RBC 2.84*  --  2.70*  HCT 26.2* 24.2* 24.9*  PLT 88*  --  84*    Recent Labs  11/22/15 0543  NA 141  K 4.0  CL 109  CO2 27  BUN 26*  CREATININE 0.41*  GLUCOSE 101*  CALCIUM 7.9*   No results for input(s): LABPT, INR in the last 72 hours.  Physical Exam: Orthopedic examination is limited to the left shoulder and upper extremity. He has 1-2+ swelling in the shoulder, extending down into the forearm region. Some ecchymosis is noted as well. He has moderate tenderness to palpation over the anterior and lateral aspects of the shoulder, as well as pain with any attempted active or passive motion of the shoulder. He is neurovascularly intact to his left hand.  Assessment: Status post varus impacted left proximal humerus fracture.  Plan: I have brought a shoulder immobilizer from the office and put it on the patient, as I feel that this will be both more effective and more comfortable for the patient. He is to follow up in my office in 2-3 weeks for repeat x-rays of his left shoulder. Assuming things are progressing well, we might start physical therapy shortly  thereafter. Thank you for asking me to participate in the care of this pleasant man.   Jasen Belew Marivel Mcclarty 11/24/2015, 4:48 PM

## 2015-11-24 NOTE — Progress Notes (Addendum)
Patient ID: Wesley Mcguire, male   DOB: Jun 24, 1931, 81 y.o.   MRN: [id number] [place] [facility] [place] NAME: Wesley Mcguire    MR#:  [id number]  DATE OF BIRTH:  [birth year]  SUBJECTIVE:  Continues to be weak and fragile. By mouth intake has improved. Per oncology recommendations, receiving 1 unit transfusion. Plan is to be discharged to rehabilitation.  REVIEW OF SYSTEMS:   Review of Systems  Constitutional: Negative for fever, chills and weight loss.  HENT: Negative for ear discharge, ear pain and nosebleeds.   Eyes: Negative for blurred vision, pain and discharge.  Respiratory: Negative for sputum production, shortness of breath, wheezing and stridor.   Cardiovascular: Negative for chest pain, palpitations, orthopnea and PND.  Gastrointestinal: Negative for nausea, vomiting, abdominal pain and diarrhea.  Genitourinary: Negative for urgency and frequency.  Musculoskeletal: Positive for back pain and falls. Negative for joint pain.       Left shoulder pain from the fracture  Neurological: Positive for weakness. Negative for sensory change, speech change and focal weakness.  Psychiatric/Behavioral: Negative for depression and hallucinations. The patient is not nervous/anxious.   All other systems reviewed and are negative.  Tolerating Diet:yes Tolerating PT: Patient not participating well with physical therapy  DRUG ALLERGIES:  No Known Allergies  VITALS:  Blood pressure 110/53, pulse 98, temperature 98.6 F (37 C), temperature source Oral, resp. rate 18, height 5\' 9"  (1.753 m), weight 48.943 kg (107 lb 14.4 oz), SpO2 95 %.  PHYSICAL EXAMINATION:   Physical Exam  GENERAL:  81 y.o.-year-old elderly patient lying in the bed with no acute distress. Thin,cachectic EYES: Pupils equal, round, reactive to light and accommodation. No scleral icterus. Extraocular muscles intact.  HEENT: Head atraumatic, normocephalic. Oropharynx and  nasopharynx clear.  NECK:  Supple, no jugular venous distention. No thyroid enlargement, no tenderness.  LUNGS: Normal breath sounds bilaterally, no wheezing, rales, rhonchi. No use of accessory muscles of respiration. Decreased bibasilar breath sounds CARDIOVASCULAR: S1, S2 normal. No  rubs, or gallops. 3/6 systolic murmur is present ABDOMEN: Soft, nontender, nondistended. Bowel sounds present. No organomegaly or mass.  EXTREMITIES: No cyanosis, clubbing or edema b/l.   Left shoulder in a sling NEUROLOGIC: Cranial nerves II through XII are intact. No focal Motor or sensory deficits b/l.   PSYCHIATRIC:  patient is alert and oriented x 2-3.  SKIN: No obvious rash, lesion, or ulcer.   LABORATORY PANEL:   CBC  Recent Labs Lab 11/24/15 1328  WBC 121.6*  HGB 7.5*  HCT 24.9*  PLT 84*    Chemistries   Recent Labs Lab 11/21/15 1016 11/22/15 0543  NA 140 141  K 5.8* 4.0  CL 103 109  CO2 30 27  GLUCOSE 379* 101*  BUN 29* 26*  CREATININE 0.62 0.41*  CALCIUM 8.7* 7.9*  AST 25  --   ALT 16*  --   ALKPHOS 137*  --   BILITOT 0.6  --     Cardiac Enzymes  Recent Labs Lab 11/21/15 1016  TROPONINI <0.03    RADIOLOGY:  No results found. ASSESSMENT AND PLAN:  [name] is a 81 y.o. male with a known history of CLLfollowed by the [facility] since July of this year. Patient presented emergency room after he had sustained a fall at home. He is being admitted with  #1 acute hyperkalemia -Received treatment for it. Potassium improved - continue to monitor. Patient received IV fluids.  2 uncontrolled diabetes mellitus -hemoglobin  A1c10-11 -Started on Levemir and also NovoLog pre-meals. Blood sugars are better. Continue to monitor and adjust as needed. Also on sliding scale insulin.  #3 history of CLL not taking any treatment and followed with the hospice since July: Now patient and the family requesting treatment: -oncology consulted. Appreciated. CT of the  abdomen and pelvis as recommended.  -Physical strengthening through rehabilitation and then reevaluation by oncology as outpatient in 1-2 weeks to see patient would be a candidate for treatment. Until then support through transfusions. -Wbc's elevated and stable. -Also has anemia and thrombocytopenia.  #4 displaced fracture of proximal end of left humerus: pain control,continue sling for left shoulder -immobiliser for 3-4 week. Non-surgical treatment. Orthopedic follow-up as outpatient. Physical therapy recommended  5.remote strokes; unable to use aspirin secondary to anemia  #6 Anemia with generalized weakness in the setting of CLL -Status post transfusion of 1 unit packed RBC so far, hemoglobin at 7.5. Per oncology recommendations will transfuse 1 more unit to keep his hemoglobin around 8  #7 History of falls deconditioning physical therapy consult.  Recommended SNF at this time  Plan discussed with patient, his daughter at bedside, Education officer, museum and also patient's oncologist  Case discussed with Care Management/Social Worker. Management plans discussed with the patient, family and they are in agreement.  CODE STATUS: DO NOT RESUSCITATE  DVT Prophylaxis: SCD teds No Heparin products due to thrombocytopenia  TOTAL TIME TAKING CARE OF THIS PATIENT: 36 minutes.   POSSIBLE D/C IN 1-2 DAYS, DEPENDING ON CLINICAL CONDITION.   Gladstone Lighter M.D on 11/24/2015 at 2:57 PM  Between 7am to 6pm - Pager - [phone]  After 6pm go to [email or web address] - password [email or web address] [facility]  Office  [phone]  CC: Primary care physician; Pcp Not In System

## 2015-11-24 NOTE — Clinical Social Work Placement (Signed)
   CLINICAL SOCIAL WORK PLACEMENT  NOTE  Date:  11/24/2015  Patient Details  Name: Wesley Mcguire MRN: [id number] Date of Birth: Jan 18, 1931  Clinical Social Work is seeking post-discharge placement for this patient at the [place] level of care (*CSW will initial, date and re-position this form in  chart as items are completed):  Yes   Patient/family provided with [place] Work Department's list of facilities offering this level of care within the geographic area requested by the patient (or if unable, by the patient's family).  Yes   Patient/family informed of their freedom to choose among providers that offer the needed level of care, that participate in Medicare, Medicaid or managed care program needed by the patient, have an available bed and are willing to accept the patient.  Yes   Patient/family informed of [facility]'s ownership interest in [facility] and [facility], as well as of the fact that they are under no obligation to receive care at these facilities.  PASRR submitted to EDS on       PASRR number received on       Existing PASRR number confirmed on 11/24/15     FL2 transmitted to all facilities in geographic area requested by pt/family on 11/24/15     FL2 transmitted to all facilities within larger geographic area on       Patient informed that his/her managed care company has contracts with or will negotiate with certain facilities, including the following:            Patient/family informed of bed offers received.  Patient chooses bed at       Physician recommends and patient chooses bed at      Patient to be transferred to   on  .  Patient to be transferred to facility by       Patient family notified on   of transfer.  Name of family member notified:        PHYSICIAN       Additional Comment:    _______________________________________________ Darden Dates, LCSW 11/24/2015, 3:17 PM

## 2015-11-24 NOTE — NC FL2 (Addendum)
[place] LEVEL OF CARE SCREENING TOOL     IDENTIFICATION  Patient Name: Wesley Mcguire Birthdate: 1931/09/24 Sex: male Admission Date (Current Location): 11/21/2015  [place] and [place] Number:     Facility and Address:  [facility], [address], [place],  [address]      Provider Number: [id number]  Attending Physician Name and Address:  Gladstone Lighter, MD  Relative Name and Phone Number:       Current Level of Care: Hospital Recommended Level of Care: [place] Prior Approval Number:    Date Approved/Denied:   PASRR Number: [id number]  Discharge Plan: Home    Current Diagnoses: Patient Active Problem List   Diagnosis Date Noted  . Protein-calorie malnutrition, severe 11/22/2015  . Hyperkalemia 11/21/2015    Orientation ACTIVITIES/SOCIAL BLADDER RESPIRATION    Self, Place  Passive Incontinent Normal  BEHAVIORAL SYMPTOMS/MOOD NEUROLOGICAL BOWEL NUTRITION STATUS      Continent Diet (Carb Modified)  PHYSICIAN VISITS COMMUNICATION OF NEEDS Height & Weight Skin  30 days Verbally 5' 9"  (175.3 cm) 107 lbs. Normal          AMBULATORY STATUS RESPIRATION    Assist extensive Normal      Personal Care Assistance Level of Assistance  Bathing, Feeding, Dressing Bathing Assistance: Maximum assistance Feeding assistance: Maximum assistance Dressing Assistance: Maximum assistance      Functional Limitations Info  Sight, Hearing, Speech Sight Info: Impaired Hearing Info: Impaired Speech Info: Adequate       SPECIAL CARE FACTORS FREQUENCY  PT (By licensed PT)                   Additional Factors Info  Code Status, Allergies Code Status Info: Full Code Allergies Info: No known allerigies           Current Medications (11/24/2015):  This is the current hospital active medication list Current Facility-Administered Medications  Medication Dose Route Frequency Provider Last Rate  Last Dose  . acetaminophen (TYLENOL) tablet 650 mg  650 mg Oral Q6H PRN Epifanio Lesches, MD   650 mg at 11/23/15 0816   Or  . acetaminophen (TYLENOL) suppository 650 mg  650 mg Rectal Q6H PRN Epifanio Lesches, MD      . alum & mag hydroxide-simeth (MAALOX/MYLANTA) 200-200-20 MG/5ML suspension 30 mL  30 mL Oral Q6H PRN Epifanio Lesches, MD      . feeding supplement (ENSURE ENLIVE) (ENSURE ENLIVE) liquid 237 mL  237 mL Oral TID WC Fritzi Mandes, MD   237 mL at 11/25/15 1319  . insulin aspart (novoLOG) injection 0-9 Units  0-9 Units Subcutaneous TID WC Epifanio Lesches, MD   3 Units at 11/25/15 1319  . insulin aspart (novoLOG) injection 3 Units  3 Units Subcutaneous TID WC Epifanio Lesches, MD   3 Units at 11/25/15 1318  . insulin detemir (LEVEMIR) injection 14 Units  14 Units Subcutaneous QHS Gladstone Lighter, MD      . insulin starter kit- pen needles (English) 1 kit  1 kit Other Once Gladstone Lighter, MD      . living well with diabetes book MISC   Does not apply Once Gladstone Lighter, MD      . morphine 2 MG/ML injection 1 mg  1 mg Intravenous Q4H PRN Fritzi Mandes, MD   1 mg at 11/25/15 0519  . ondansetron (ZOFRAN) tablet 4 mg  4 mg Oral Q6H PRN Epifanio Lesches, MD       Or  . ondansetron (  ZOFRAN) injection 4 mg  4 mg Intravenous Q6H PRN Epifanio Lesches, MD      . senna-docusate (Senokot-S) tablet 1 tablet  1 tablet Oral QHS PRN Epifanio Lesches, MD      . traMADol Veatrice Bourbon) tablet 50 mg  50 mg Oral Q6H PRN Gladstone Lighter, MD   50 mg at 11/25/15 0417     Discharge Medications: Please see discharge summary for a list of discharge medications.  Relevant Imaging Results:  Relevant Lab Results:  Recent Labs    Additional Information    Darden Dates, LCSW

## 2015-11-24 NOTE — Care Management (Addendum)
Admitted to [facility] with the diagnosis of hyperkalemia and multiple falls. States he lives with his daughter Tyronne Doose) , son [name] is listed as contact person [phone]). Followed at the [facility] by Dr. Rogue Bussing. Followed by [facility] in the home. Difficult historian. Seen by Dr. Rae Mar after a fall for fractured shoulder. No surgery at this time. Seen by Dr. Grayland Ormond 11/23/15. No interventions for CCL at this time. Will update Lorriane Shire at [facility] [phone]) States that [facility] will continue to follow Mr. Hartling at home or a skilled facility. Shelbie Ammons RN MSN CCM Care Management [phone]

## 2015-11-24 NOTE — Progress Notes (Signed)
Wesley Mcguire   DOB:[birth year]   [id number]    Subjective: Patient eating lunch in the bed. Patient unfortunately is a poor historian. However he seems to be little more active/talking.   Review of system: Still completed the shortness of breath with exertion. Denies any nausea vomiting.  Objective:  Filed Vitals:   11/23/15 2143 11/24/15 0522  BP: 119/51 113/61  Pulse: 105 92  Temp: 99.5 F (37.5 C) 98.4 F (36.9 C)  Resp: 17 16     Intake/Output Summary (Last 24 hours) at 11/24/15 1306 Last data filed at 11/24/15 1200  Gross per 24 hour  Intake    995 ml  Output      0 ml  Net    995 ml    GENERAL: Cachectic appearing Caucasian male patient ; resting comfortably in the bed. No acute distress. His daughter is with the bedside.  EYES: Positive for pallor icterus OROPHARYNX: no thrush or ulceration; poor dentition NECK: supple, no masses felt LYMPH: no palpable lymphadenopathy in the cervical, axillary or inguinal regions LUNGS: clear to auscultation and No wheeze or crackles HEART/CVS: regular rate & rhythm and no murmurs; No lower extremity edema ABDOMEN: abdomen soft, non-tender and normal bowel sounds Musculoskeletal:no cyanosis of digits and no clubbing  PSYCH: alert & oriented1-2.  NEURO: no focal motor/sensory deficits SKIN: no rashes or significant lesions.  Labs:  Lab Results  Component Value Date   WBC 138.5* 11/22/2015   HGB 8.0* 11/22/2015   HCT 24.2* 11/22/2015   MCV 92.3 11/22/2015   PLT 88* 11/22/2015   NEUTROABS 17.5* 11/21/2015    Lab Results  Component Value Date   NA 141 11/22/2015   K 4.0 11/22/2015   CL 109 11/22/2015   CO2 27 11/22/2015    Studies:  Dg Shoulder Left  11/22/2015  CLINICAL DATA:  Known left shoulder fracture.  Initial encounter. EXAM: LEFT SHOULDER - 2+ VIEW COMPARISON:  None. FINDINGS: There is a surgical neck fracture of the left humerus with posterior impaction and ventral displacement. There is continuation  at least to the base of the greater tuberosity without visualized displacement at this level. The glenohumeral joint is located. Intact, degenerated acromioclavicular joint. Marked osteopenia. IMPRESSION: Displaced surgical neck humerus fracture. Electronically Signed   By: Monte Fantasia M.D.   On: 11/22/2015 13:40    Assessment & Plan:   # CLL recurrence- with elevated white count/anemia and thrombus cytopenia. The CT scan also shows mild progression of the retroperitoneal adenopathy. But I do not think patient's CLL is only reason for the patient's worsening clinical status; I think his poorly controlled diabetes is more significant.  # Diabetes poorly controlled.  Recommendations:   I had a long discussion with the patient's daughter regarding the above impression. Patient could be considered for single agent rituximab as an outpatient basis if he agrees to it. But I think he would be at high risk of potential side effects of the treatments/including hyperglycemia if steroids use part of the rituximab therapy.   # For now I recommend 2 units of PRBC transfusion; and discharged home under hospice/as patient is currently under hospice.  I would recommend a follow-up in approximately 2 weeks/with labs CBC CMP- and to have a discussion regarding treatment.  Unfortunately patient does not participate actively in the treatment discussion; however, await outpatient discussion regarding the next plan of care. I reviewed the images of the CT scan independently/summarized as above.  The above plan of care was discussed  with Dr.Kalisetti.    Cammie Sickle, MD 11/24/2015  1:06 PM

## 2015-11-25 LAB — GLUCOSE, CAPILLARY
GLUCOSE-CAPILLARY: 200 mg/dL — AB (ref 65–99)
GLUCOSE-CAPILLARY: 223 mg/dL — AB (ref 65–99)
GLUCOSE-CAPILLARY: 306 mg/dL — AB (ref 65–99)
Glucose-Capillary: 237 mg/dL — ABNORMAL HIGH (ref 65–99)

## 2015-11-25 LAB — HEPATITIS B SURFACE ANTIGEN: Hepatitis B Surface Ag: NEGATIVE

## 2015-11-25 LAB — HEMOGLOBIN: HEMOGLOBIN: 8.9 g/dL — AB (ref 13.0–18.0)

## 2015-11-25 MED ORDER — INSULIN DETEMIR 100 UNIT/ML ~~LOC~~ SOLN
14.0000 [IU] | Freq: Every day | SUBCUTANEOUS | Status: DC
  Administered 2015-11-25: 22:00:00 14 [IU] via SUBCUTANEOUS
  Filled 2015-11-25 (×2): qty 0.14

## 2015-11-25 MED ORDER — INSULIN DETEMIR 100 UNIT/ML ~~LOC~~ SOLN: 14.0000 [IU] | Freq: Every day | SUBCUTANEOUS | Status: DC

## 2015-11-25 MED ORDER — LIVING WELL WITH DIABETES BOOK
Freq: Once | Status: AC
  Administered 2015-11-26: 12:00:00
  Filled 2015-11-25: qty 1

## 2015-11-25 MED ORDER — TRAMADOL HCL 50 MG PO TABS: 50.0000 mg | ORAL_TABLET | Freq: Four times a day (QID) | ORAL | Status: DC | PRN

## 2015-11-25 MED ORDER — ALUM & MAG HYDROXIDE-SIMETH 200-200-20 MG/5ML PO SUSP: 30.0000 mL | Freq: Four times a day (QID) | ORAL | Status: DC | PRN

## 2015-11-25 MED ORDER — INSULIN STARTER KIT- PEN NEEDLES (ENGLISH)
1.0000 | Freq: Once | Status: AC
  Administered 2015-11-26: 12:00:00 1
  Filled 2015-11-25: qty 1

## 2015-11-25 MED ORDER — INSULIN ASPART 100 UNIT/ML ~~LOC~~ SOLN: 3.0000 [IU] | Freq: Three times a day (TID) | SUBCUTANEOUS | Status: DC

## 2015-11-25 MED ORDER — ENSURE ENLIVE PO LIQD: 237.0000 mL | Freq: Three times a day (TID) | ORAL | Status: DC

## 2015-11-25 NOTE — Clinical Social Work Note (Signed)
Clinical Social Work Assessment  Patient Details  Name: Wesley Mcguire MRN: [id number] Date of Birth: February 14, 1931  Date of referral:  11/25/15               Reason for consult:  Facility Placement                Permission sought to share information with:  Family Supports Permission granted to share information::  Yes, Verbal Permission Granted  Name::      (daughter)   Housing/Transportation Living arrangements for the past 2 months:  Single Family Home Source of Information:  Adult Children Patient Interpreter Needed:  None Criminal Activity/Legal Involvement Pertinent to Current Situation/Hospitalization:  No - Comment as needed Significant Relationships:  Adult Children Lives with:  Self Do you feel safe going back to the place where you live?  Yes Need for family participation in patient care:  Yes (Comment)  Care giving concerns:  Pt lives alone.   Social Worker assessment / plan:  CSW me with pt and daughter to address consult on 11/24/2015. PT is recommending SNF. CSW introduced herself and explained role of social work. CSW also explained the process of discharging to SNF. Currently pt is open to [facility], however would like rehab. Per daughter, pt also receives meals on wheels. CSW will initiate SNF search and will follow up with bed offers. CSW will also call [facility] to revoke services at discharge.   CSW will continue to follow.   Employment status:  Retired Forensic scientist:  Information systems manager, Other (Comment Required) ([employer]) PT Recommendations:  [place] / Referral to community resources:  [place]  Patient/Family's Response to care:  Pt's daughter was appreciative of CSW support.   Patient/Family's Understanding of and Emotional Response to Diagnosis, Current Treatment, and Prognosis:  Pt's daughter understands that pt will need a higher level of care.   Emotional Assessment Appearance:  Appears  older than stated age Attitude/Demeanor/Rapport:  Other (appropriate) Affect (typically observed):  Flat Orientation:  Oriented to Self Alcohol / Substance use:  Never Used Psych involvement (Current and /or in the community):  No (Comment)  Discharge Needs  Concerns to be addressed:  Adjustment to Illness Readmission within the last 30 days:  No Current discharge risk:  Chronically ill Barriers to Discharge:  No Barriers Identified   Darden Dates, LCSW 11/25/2015, 10:46 AM

## 2015-11-25 NOTE — Progress Notes (Signed)
Nutrition Follow-up  DOCUMENTATION CODES:   Severe malnutrition in context of chronic illness  INTERVENTION:   Meals and Snacks: Cater to patient preferences Medical Food Supplement Therapy: continue as ordered   NUTRITION DIAGNOSIS:   Malnutrition related to chronic illness as evidenced by severe depletion of muscle mass, severe depletion of body fat.  GOAL:   Patient will meet greater than or equal to 90% of their needs; ongoing  MONITOR:    (Energy Intake, Glucose Profile, Anthropometrics, Digestive System)  REASON FOR ASSESSMENT:   Malnutrition Screening Tool    ASSESSMENT:   Pt admitted s/p fall PTA. Pt with left humerus fracture, orthopeadics following, and hyperkalemia s/p kayexelate given. Pt with h/o CLL.  Pt also noted to have anemis s/p transfusion.  Pt with mild discomfort of left shoulder but no other complaints on visit. Noted Diabetes Coordinator consulted.  Diet Order:  Diet Carb Modified Fluid consistency:: Thin; Room service appropriate?: Yes    Current Nutrition: Pt ate 100% of lunch including 100% of Ensure and Magic Cup on visit today. Pt reports eating well at breakfast too but could not remember what. Recorded po intake 84% of meals on average.   Gastrointestinal Profile: Last BM: 11/25/2015   Scheduled Medications:  . feeding supplement (ENSURE ENLIVE)  237 mL Oral TID WC  . insulin aspart  0-9 Units Subcutaneous TID WC  . insulin aspart  3 Units Subcutaneous TID WC  . insulin detemir  14 Units Subcutaneous QHS  . insulin starter kit- pen needles  1 kit Other Once  . living well with diabetes book   Does not apply Once     Electrolyte/Renal Profile and Glucose Profile:   Recent Labs Lab 11/21/15 1016 11/22/15 0543  NA 140 141  K 5.8* 4.0  CL 103 109  CO2 30 27  BUN 29* 26*  CREATININE 0.62 0.41*  CALCIUM 8.7* 7.9*  GLUCOSE 379* 101*   Protein Profile:  Recent Labs Lab 11/21/15 1016  ALBUMIN 3.6     Weight Trend since  Admission: Filed Weights   11/21/15 0956 11/21/15 1645  Weight: 109 lb (49.442 kg) 107 lb 14.4 oz (48.943 kg)     Skin:  Reviewed, no issues   BMI:  Body mass index is 15.93 kg/(m^2).  Estimated Nutritional Needs:   Kcal:  using IBW of 72.7kg, BEE: 1412kcals, TEE: (IF 1.1-1.3)(AF 1.2) [id number]  Protein:  80-95g protein (1.1-1.3g/kg)  Fluid:  1818-2156m of fluid (25-361mkg)  EDUCATION NEEDS:   No education needs identified at this time   MOWaldoRD, LDN Pager [phone]

## 2015-11-25 NOTE — Progress Notes (Signed)
Inpatient Diabetes Program Recommendations  AACE/ADA: New Consensus Statement on Inpatient Glycemic Control (2015)  Target Ranges:  Prepandial:   less than 140 mg/dL      Peak postprandial:   less than 180 mg/dL (1-2 hours)      Critically ill patients:  140 - 180 mg/dL   Review of Glycemic Control:  Results for Wesley Mcguire, Wesley Mcguire (MRN [id number]) as of 11/25/2015 12:43  Ref. Range 11/24/2015 11:10 11/24/2015 16:27 11/24/2015 22:47 11/25/2015 07:24 11/25/2015 11:42  Glucose-Capillary Latest Ref Range: 65-99 mg/dL 224 (H) 279 (H) 324 (H) 200 (H) 223 (H)    Diabetes history: Type 2 diabetes  Inpatient Diabetes Program Recommendations:    Consider increasing Levemir to 14 units daily.  Also will order insulin pen starter kit for daughter so that after SNF, she will have teaching materials for insulin/diabetes.  Daughter not here at this time for insulin pen teaching.  Will ask RN to please demonstrate insulin administration with vial/syringe when daughter is available.  Thanks, Adah Perl, RN, BC-ADM Inpatient Diabetes Coordinator Pager [phone] (8a-5p)

## 2015-11-25 NOTE — Progress Notes (Signed)
Physical Therapy Treatment Patient Details Name: Wesley Mcguire MRN: [id number] DOB: 09/28/1931 Today's Date: 11/25/2015    History of Present Illness 81 yo  male with onset of leukemia, osteopenia, anasarca and pleural effusiion also has an adynamic ileus with fall at home creating a L humeral surgical neck fracture.  MD has decided not to repair fracture, asked to leave in sling.    PT Comments    Pt is getting up to bedside with improved balance and ccontrol, has been able to sit and work on changing out of Special educational needs teacher with PT.  He is expected to make steady improvements with rehab and will continue to work with him as tolerated.  Follow Up Recommendations  SNF     Equipment Recommendations  None recommended by PT    Recommendations for Other Services Rehab consult     Precautions / Restrictions Precautions Precautions: Shoulder;Other (comment) Type of Shoulder Precautions: in sling at all times Shoulder Interventions: Shoulder sling/immobilizer;At all times (for PT) Precaution Comments: instructed pt not to put pressure through LUE Restrictions Weight Bearing Restrictions: Yes Other Position/Activity Restrictions: NWB LUE    Mobility  Bed Mobility Overal bed mobility: Needs Assistance Bed Mobility: Supine to Sit;Sit to Supine     Supine to sit: Mod assist;Min assist Sit to supine: Min assist;Mod assist   General bed mobility comments: reminded pt not to use LUE and did not attempt  Transfers                 General transfer comment: declined to attempt  Ambulation/Gait                 Stairs            Wheelchair Mobility    Modified Rankin (Stroke Patients Only)       Balance Overall balance assessment: Needs assistance Sitting-balance support: Feet supported Sitting balance-Leahy Scale: Fair                              Cognition Arousal/Alertness: Awake/alert Behavior During Therapy: Flat affect Overall  Cognitive Status: Impaired/Different from baseline Area of Impairment: Memory;Safety/judgement;Awareness;Problem solving     Memory: Decreased recall of precautions;Decreased short-term memory   Safety/Judgement: Decreased awareness of safety;Decreased awareness of deficits Awareness: Intellectual Problem Solving: Slow processing;Decreased initiation;Requires verbal cues General Comments: Pt declined to get OOB to chair and is expected to go to SNF today    Exercises      General Comments General comments (skin integrity, edema, etc.): Pt is in wet clothing and brief, unaware until PT moved him to bedside then became concerned about his skin.      Pertinent Vitals/Pain Pain Assessment: Faces Pain Score: 6  Faces Pain Scale: Hurts even more Pain Location: L shoulder Pain Descriptors / Indicators: Aching Pain Intervention(s): Limited activity within patient's tolerance;Monitored during session;Repositioned    Home Living                      Prior Function            PT Goals (current goals can now be found in the care plan section)      Frequency  Min 2X/week    PT Plan      Co-evaluation             End of Session Equipment Utilized During Treatment: Oxygen Activity Tolerance: Patient limited by fatigue Patient left: in bed;with  call bell/phone within reach;with bed alarm set;with nursing/sitter in room     Time: 1405-1436 PT Time Calculation (min) (ACUTE ONLY): 31 min  Charges:  $Therapeutic Activity: 23-37 mins                    G Codes:      Ramond Dial November 26, 2015, 4:06 PM   Mee Hives, PT MS Acute Rehab Dept. Number: [phone] [phone] and [place] [phone]

## 2015-11-25 NOTE — Progress Notes (Addendum)
Patient had no c/o pain this shift VSS IV fluids discontinued  Awaiting placement at rehab facility  Living Well with Diabetes book and Diabetes packet arrived on unit near shifts end and will need to be review with patient and he family when they come

## 2015-11-25 NOTE — Plan of Care (Signed)
Problem: Safety: Goal: Ability to remain free from injury will improve Outcome: Progressing Pt remains free from injury, bed in lowest position, call bell and phone within reach, bed alarm on.  Problem: Pain Managment: Goal: General experience of comfort will improve Outcome: Progressing No discomfort reported this shift.  Problem: Activity: Goal: Risk for activity intolerance will decrease Outcome: Progressing Pt continues to be weak and remains in bed.

## 2015-11-25 NOTE — Discharge Summary (Signed)
[facility] NAME: Wesley Mcguire    MR#:  [id number]  DATE OF BIRTH:  [birth year]  DATE OF ADMISSION:  11/21/2015 ADMITTING PHYSICIAN: Epifanio Lesches, MD  DATE OF DISCHARGE: 11/25/15  PRIMARY CARE PHYSICIAN: Pcp Not In System    ADMISSION DIAGNOSIS:  Hyperkalemia [E87.5] Weakness [R53.1] CLL (chronic lymphocytic leukemia) (HCC) [C91.10] Hyperglycemia [R73.9] Humerus fracture, left, closed, initial encounter [S42.302A]  DISCHARGE DIAGNOSIS:  Active Problems:   Hyperkalemia   Protein-calorie malnutrition, severe   SECONDARY DIAGNOSIS:   Past Medical History  Diagnosis Date  . Diabetes mellitus without complication ([place])   . Leukemia [facility])     HOSPITAL COURSE:   Wesley Mcguire is a 81 y.o. male with a known history of CLLfollow[facility]. Patient presented emergency room after he had sustained a fall at home. He is being admitted with  #1 Acute hyperkalemia -Received treatment for it. Potassium improved  2 uncontrolled diabetes mellitus -hemoglobin A1c10-11 -on Levemir and also NovoLog pre-meals. Adjusting levemir dose  #3 History of CLL- not taking any treatment and followed with the hospice since July: Now patient and the family requesting treatment: -oncology consulted. Appreciated. CT of the abdomen and pelvis as recommended.  -Physical strengthening through rehabilitation and then reevaluation by oncology as outpatient in 1-2 weeks to see patient would be a candidate for treatment. Until then support through transfusions. -Wbc's elevated and stable. -Also has anemia and thrombocytopenia.  #4 Displaced fracture of proximal end of left humerus: pain control, continue sling for left shoulder -immobiliser for 3-4 weeks. Non-surgical treatment.  -Orthopedic follow-up as outpatient. Physical therapy recommended  5. Remote strokes; unable to use aspirin  secondary to anemia  #6 Anemia with generalized weakness in the setting of CLL -Status post transfusion of 2 units packed RBC.  -Per oncology recommendations - plan to keep his hemoglobin around 8  #7 History of falls deconditioning physical therapy consult.  Recommended SNF at this time  For discharge today.  DISCHARGE CONDITIONS:   Guarded  CONSULTS OBTAINED:  Treatment Team:  Corky Mull, MD Lloyd Huger, MD Fritzi Mandes, MD  DRUG ALLERGIES:  No Known Allergies  DISCHARGE MEDICATIONS:   Current Discharge Medication List    START taking these medications   Details  alum & mag hydroxide-simeth (MAALOX/MYLANTA) 200-200-20 MG/5ML suspension Take 30 mLs by mouth every 6 (six) hours as needed for indigestion or heartburn (dyspepsia). Qty: 355 mL, Refills: 0    feeding supplement, ENSURE ENLIVE, (ENSURE ENLIVE) LIQD Take 237 mLs by mouth 3 (three) times daily with meals. Qty: 237 mL, Refills: 12    insulin aspart (NOVOLOG) 100 UNIT/ML injection Inject 3 Units into the skin 3 (three) times daily with meals. Qty: 10 mL, Refills: 11    insulin detemir (LEVEMIR) 100 UNIT/ML injection Inject 0.14 mLs (14 Units total) into the skin at bedtime. Qty: 10 mL, Refills: 11      CONTINUE these medications which have CHANGED   Details  traMADol (ULTRAM) 50 MG tablet Take 1 tablet (50 mg total) by mouth every 6 (six) hours as needed for moderate pain. Qty: 30 tablet, Refills: 0         DISCHARGE INSTRUCTIONS:   1. PCP f/u in 1 week 2. Oncology f/u in 1-2 weeks 3. Orthopedics follow up in 2 weeks  If you experience worsening of your admission symptoms, develop shortness of breath, life threatening emergency, suicidal or  homicidal thoughts you must seek medical attention immediately by calling 911 or calling your MD immediately  if symptoms less severe.  You Must read complete instructions/literature along with all the possible adverse reactions/side effects for all the  Medicines you take and that have been prescribed to you. Take any new Medicines after you have completely understood and accept all the possible adverse reactions/side effects.   Please note  You were cared for by a hospitalist during your hospital stay. If you have any questions about your discharge medications or the care you received while you were in the hospital after you are discharged, you can call the unit and asked to speak with the hospitalist on call if the hospitalist that took care of you is not available. Once you are discharged, your primary care physician will handle any further medical issues. Please note that NO REFILLS for any discharge medications will be authorized once you are discharged, as it is imperative that you return to your primary care physician (or establish a relationship with a primary care physician if you do not have one) for your aftercare needs so that they can reassess your need for medications and monitor your lab values.    Today   CHIEF COMPLAINT:   Chief Complaint  Patient presents with  . Fall    VITAL SIGNS:  Blood pressure 105/55, pulse 84, temperature 98 F (36.7 C), temperature source Oral, resp. rate 18, height 5\' 9"  (1.753 m), weight 48.943 kg (107 lb 14.4 oz), SpO2 94 %.  I/O:   Intake/Output Summary (Last 24 hours) at 11/25/15 1400 Last data filed at 11/25/15 0800  Gross per 24 hour  Intake   1359 ml  Output      0 ml  Net   1359 ml    PHYSICAL EXAMINATION:   Physical Exam  GENERAL: 81 y.o.-year-old elderly patient lying in the bed with no acute distress. Thin,cachectic EYES: Pupils equal, round, reactive to light and accommodation. No scleral icterus. Extraocular muscles intact.  HEENT: Head atraumatic, normocephalic. Oropharynx and nasopharynx clear.  NECK: Supple, no jugular venous distention. No thyroid enlargement, no tenderness.  LUNGS: Normal breath sounds bilaterally, no wheezing, rales, rhonchi. No use of  accessory muscles of respiration. Decreased bibasilar breath sounds CARDIOVASCULAR: S1, S2 normal. No rubs, or gallops. 3/6 systolic murmur is present ABDOMEN: Soft, nontender, nondistended. Bowel sounds present. No organomegaly or mass.  EXTREMITIES: No cyanosis, clubbing or edema b/l. Left shoulder in a sling NEUROLOGIC: Cranial nerves II through XII are intact. No focal Motor or sensory deficits b/l.  PSYCHIATRIC: patient is alert and oriented x 2-3.  SKIN: No obvious rash, lesion, or ulcer.   DATA REVIEW:   CBC  Recent Labs Lab 11/24/15 1328 11/24/15 2336  WBC 121.6*  --   HGB 7.5* 8.9*  HCT 24.9*  --   PLT 84*  --     Chemistries   Recent Labs Lab 11/21/15 1016 11/22/15 0543  NA 140 141  K 5.8* 4.0  CL 103 109  CO2 30 27  GLUCOSE 379* 101*  BUN 29* 26*  CREATININE 0.62 0.41*  CALCIUM 8.7* 7.9*  AST 25  --   ALT 16*  --   ALKPHOS 137*  --   BILITOT 0.6  --     Cardiac Enzymes  Recent Labs Lab 11/21/15 1016  TROPONINI <0.03    Microbiology Results  No results found for this or any previous visit.  RADIOLOGY:  No results found.  EKG:  Orders placed or performed during the hospital encounter of 11/21/15  . ED EKG  . ED EKG  . EKG 12-Lead  . EKG 12-Lead      Management plans discussed with the patient, family and they are in agreement.  CODE STATUS:     Code Status Orders        Start     Ordered   11/21/15 1424  Do not attempt resuscitation (DNR)   Continuous    Question Answer Comment  In the event of cardiac or respiratory ARREST Do not call a "code blue"   In the event of cardiac or respiratory ARREST Do not perform Intubation, CPR, defibrillation or ACLS   In the event of cardiac or respiratory ARREST Use medication by any route, position, wound care, and other measures to relive pain and suffering. May use oxygen, suction and manual treatment of airway obstruction as needed for comfort.   Comments Nurse may pronounce       11/21/15 1424    Advance Directive Documentation        Most Recent Value   Type of Advance Directive  Healthcare Power of Attorney   Pre-existing out of facility DNR order (yellow form or pink MOST form)     "MOST" Form in Place?        TOTAL TIME TAKING CARE OF THIS PATIENT: 37 minutes.    Damarious Holtsclaw M.D on 11/25/2015 at 2:00 PM  Between 7am to 6pm - Pager - [phone]  After 6pm go to [email or web address] - password [email or web address]  Office  [phone]  CC: Primary care physician; Pcp Not In System

## 2015-11-26 ENCOUNTER — Other Ambulatory Visit: Payer: Self-pay

## 2015-11-26 ENCOUNTER — Ambulatory Visit: Payer: Self-pay | Admitting: Internal Medicine

## 2015-11-26 LAB — GLUCOSE, CAPILLARY
GLUCOSE-CAPILLARY: 316 mg/dL — AB (ref 65–99)
Glucose-Capillary: 173 mg/dL — ABNORMAL HIGH (ref 65–99)

## 2015-11-26 LAB — TYPE AND SCREEN
ABO/RH(D): B POS
Antibody Screen: NEGATIVE
DONOR AG TYPE: NEGATIVE
Donor AG Type: NEGATIVE
UNIT DIVISION: 0
Unit division: 0

## 2015-11-26 LAB — BASIC METABOLIC PANEL
Anion gap: 4 — ABNORMAL LOW (ref 5–15)
BUN: 26 mg/dL — ABNORMAL HIGH (ref 6–20)
CHLORIDE: 103 mmol/L (ref 101–111)
CO2: 27 mmol/L (ref 22–32)
CREATININE: 0.49 mg/dL — AB (ref 0.61–1.24)
Calcium: 7.6 mg/dL — ABNORMAL LOW (ref 8.9–10.3)
GFR calc non Af Amer: >60 mL/min (ref >60)
Glucose, Bld: 141 mg/dL — ABNORMAL HIGH (ref 65–99)
Potassium: 4.3 mmol/L (ref 3.5–5.1)
Sodium: 134 mmol/L — ABNORMAL LOW (ref 135–145)

## 2015-11-26 MED ORDER — INSULIN ASPART 100 UNIT/ML ~~LOC~~ SOLN: 4.0000 [IU] | Freq: Three times a day (TID) | SUBCUTANEOUS | Status: DC

## 2015-11-26 MED ORDER — INSULIN DETEMIR 100 UNIT/ML ~~LOC~~ SOLN: 18.0000 [IU] | Freq: Every day | SUBCUTANEOUS | Status: DC

## 2015-11-26 NOTE — Clinical Social Work Placement (Signed)
   CLINICAL SOCIAL WORK PLACEMENT  NOTE  Date:  11/26/2015  Patient Details  Name: Wesley Mcguire MRN: [id number] Date of Birth: 18-May-1931  Clinical Social Work is seeking post-discharge placement for this patient at the [place] level of care (*CSW will initial, date and re-position this form in  chart as items are completed):  Yes   Patient/family provided with [facility] Work Department's list of facilities offering this level of care within the geographic area requested by the patient (or if unable, by the patient's family).  Yes   Patient/family informed of their freedom to choose among providers that offer the needed level of care, that participate in Medicare, Medicaid or managed care program needed by the patient, have an available bed and are willing to accept the patient.  Yes   Patient/family informed of [facility]'s ownership interest in [facility] and [facility], as well as of the fact that they are under no obligation to receive care at these facilities.  PASRR submitted to EDS on       PASRR number received on       Existing PASRR number confirmed on 11/24/15     FL2 transmitted to all facilities in geographic area requested by pt/family on 11/24/15     FL2 transmitted to all facilities within larger geographic area on       Patient informed that his/her managed care company has contracts with or will negotiate with certain facilities, including the following:        Yes   Patient/family informed of bed offers received.  Patient chooses bed at [facility]     Physician recommends and patient chooses bed at  [facility])    Patient to be transferred to [facility] on 11/26/15.  Patient to be transferred to facility by [facility]     Patient family notified on 11/26/15 of transfer.  Name of family member notified:  pt, pt's son, pt's daughter     PHYSICIAN       Additional Comment:     _______________________________________________ Darden Dates, LCSW 11/26/2015, 11:41 AM

## 2015-11-26 NOTE — Discharge Summary (Signed)
Wesley Mcguire at [place] NAME: Wesley Mcguire    MR#:  [id number]  DATE OF BIRTH:  Dec 08, 1931  DATE OF ADMISSION:  11/21/2015 ADMITTING PHYSICIAN: Epifanio Lesches, MD  DATE OF DISCHARGE: 11/26/15  PRIMARY CARE PHYSICIAN: Pcp Not In System    ADMISSION DIAGNOSIS:  Hyperkalemia [E87.5] Weakness [R53.1] CLL (chronic lymphocytic leukemia) (HCC) [C91.10] Hyperglycemia [R73.9] Humerus fracture, left, closed, initial encounter [S42.302A]  DISCHARGE DIAGNOSIS:  Active Problems:   Hyperkalemia   Protein-calorie malnutrition, severe   SECONDARY DIAGNOSIS:   Past Medical History  Diagnosis Date  . Diabetes mellitus without complication (Wesley Mcguire)   . Leukemia [facility])     HOSPITAL COURSE:   Wesley Mcguire is a 81 y.o. male with a known history of CLL followed by the community hospice since July of this year. Patient presented emergency room after he had sustained a fall at home. He is being admitted with left shoulder fracture  #1  uncontrolled diabetes mellitus -hemoglobin A1c10-11 -on Levemir and also NovoLog pre-meals. Adjusting levemir dose- needs diabetes education and 1800 calorie diabetic diet  #2 History of CLL- not taking any treatment and followed with the hospice since July: Now patient and the family requesting treatment: -oncology consulted. Appreciated. CT of the abdomen and pelvis as recommended.  -Physical strengthening through rehabilitation and then reevaluation by oncology as outpatient in 1-2 weeks to see patient would be a candidate for treatment. Until then support through transfusions. -Wbc's elevated and stable. -Also has anemia and thrombocytopenia.  #3 Fall and Displaced fracture of proximal end of left humerus: pain control, continue sling for left shoulder -immobiliser for 3-4 weeks. Non-surgical treatment.  - seen by ortho in the hospital as well -Orthopedic follow-up as outpatient. Physical therapy  recommended  #4. Remote strokes; unable to use aspirin secondary to anemia Monitor, no new neuro deficits  #5 Anemia with generalized weakness in the setting of CLL -Status post transfusion of 2 units packed RBC.  -Per oncology recommendations - plan to keep his hemoglobin around 8  #6 History of falls deconditioning physical therapy consult.  Recommended SNF at this time  For discharge today to SNF.  DISCHARGE CONDITIONS:   Guarded  CONSULTS OBTAINED:  Treatment Team:  Corky Mull, MD Lloyd Huger, MD Fritzi Mandes, MD  DRUG ALLERGIES:  No Known Allergies  DISCHARGE MEDICATIONS:   Current Discharge Medication List    START taking these medications   Details  alum & mag hydroxide-simeth (MAALOX/MYLANTA) 200-200-20 MG/5ML suspension Take 30 mLs by mouth every 6 (six) hours as needed for indigestion or heartburn (dyspepsia). Qty: 355 mL, Refills: 0    feeding supplement, ENSURE ENLIVE, (ENSURE ENLIVE) LIQD Take 237 mLs by mouth 3 (three) times daily with meals. Qty: 237 mL, Refills: 12    insulin aspart (NOVOLOG) 100 UNIT/ML injection Inject 4 Units into the skin 3 (three) times daily with meals. Qty: 10 mL, Refills: 11    insulin detemir (LEVEMIR) 100 UNIT/ML injection Inject 0.18 mLs (18 Units total) into the skin at bedtime. Qty: 10 mL, Refills: 11      CONTINUE these medications which have CHANGED   Details  traMADol (ULTRAM) 50 MG tablet Take 1 tablet (50 mg total) by mouth every 6 (six) hours as needed for moderate pain. Qty: 30 tablet, Refills: 0         DISCHARGE INSTRUCTIONS:   1. PCP f/u in 1 week 2. Oncology f/u in 1-2 weeks 3. Orthopedics follow up  in 2 weeks for left humerus fracture  If you experience worsening of your admission symptoms, develop shortness of breath, life threatening emergency, suicidal or homicidal thoughts you must seek medical attention immediately by calling 911 or calling your MD immediately  if symptoms less  severe.  You Must read complete instructions/literature along with all the possible adverse reactions/side effects for all the Medicines you take and that have been prescribed to you. Take any new Medicines after you have completely understood and accept all the possible adverse reactions/side effects.   Please note  You were cared for by a hospitalist during your hospital stay. If you have any questions about your discharge medications or the care you received while you were in the hospital after you are discharged, you can call the unit and asked to speak with the hospitalist on call if the hospitalist that took care of you is not available. Once you are discharged, your primary care physician will handle any further medical issues. Please note that NO REFILLS for any discharge medications will be authorized once you are discharged, as it is imperative that you return to your primary care physician (or establish a relationship with a primary care physician if you do not have one) for your aftercare needs so that they can reassess your need for medications and monitor your lab values.    Today   CHIEF COMPLAINT:   Chief Complaint  Patient presents with  . Fall     VITAL SIGNS:  Blood pressure 95/54, pulse 87, temperature 99.1 F (37.3 C), temperature source Oral, resp. rate 18, height 5\' 9"  (1.753 m), weight 48.943 kg (107 lb 14.4 oz), SpO2 95 %.  I/O:   Intake/Output Summary (Last 24 hours) at 11/26/15 0741 Last data filed at 11/26/15 0131  Gross per 24 hour  Intake    720 ml  Output      0 ml  Net    720 ml    PHYSICAL EXAMINATION:   Physical Exam  GENERAL: 81 y.o.-year-old elderly patient lying in the bed with no acute distress. Thin,cachectic EYES: Pupils equal, round, reactive to light and accommodation. No scleral icterus. Extraocular muscles intact.  HEENT: Head atraumatic, normocephalic. Oropharynx and nasopharynx clear.  NECK: Supple, no jugular venous  distention. No thyroid enlargement, no tenderness.  LUNGS: Normal breath sounds bilaterally, no wheezing, rales, rhonchi. No use of accessory muscles of respiration. Decreased bibasilar breath sounds CARDIOVASCULAR: S1, S2 normal. No rubs, or gallops. 3/6 systolic murmur is present ABDOMEN: Soft, nontender, nondistended. Bowel sounds present. No organomegaly or mass.  EXTREMITIES: No cyanosis, clubbing or edema b/l.2+ swelling of left shoulder extending to his forearm, Left shoulder in a sling, tenderness at the shoulder noted. NEUROLOGIC: Cranial nerves II through XII are intact. No focal Motor or sensory deficits b/l.  PSYCHIATRIC: patient is alert and oriented x 2. Poor judgement SKIN: No obvious rash, lesion, or ulcer.   DATA REVIEW:   CBC  Recent Labs Lab 11/24/15 1328 11/24/15 2336  WBC 121.6*  --   HGB 7.5* 8.9*  HCT 24.9*  --   PLT 84*  --     Chemistries   Recent Labs Lab 11/21/15 1016 11/22/15 0543  NA 140 141  K 5.8* 4.0  CL 103 109  CO2 30 27  GLUCOSE 379* 101*  BUN 29* 26*  CREATININE 0.62 0.41*  CALCIUM 8.7* 7.9*  AST 25  --   ALT 16*  --   ALKPHOS 137*  --  BILITOT 0.6  --     Cardiac Enzymes  Recent Labs Lab 11/21/15 1016  TROPONINI <0.03    Microbiology Results  No results found for this or any previous visit.  RADIOLOGY:  No results found.  EKG:   Orders placed or performed during the hospital encounter of 11/21/15  . ED EKG  . ED EKG  . EKG 12-Lead  . EKG 12-Lead      Management plans discussed with the patient, family and they are in agreement.  CODE STATUS:     Code Status Orders        Start     Ordered   11/21/15 1424  Do not attempt resuscitation (DNR)   Continuous    Question Answer Comment  In the event of cardiac or respiratory ARREST Do not call a "code blue"   In the event of cardiac or respiratory ARREST Do not perform Intubation, CPR, defibrillation or ACLS   In the event of cardiac or  respiratory ARREST Use medication by any route, position, wound care, and other measures to relive pain and suffering. May use oxygen, suction and manual treatment of airway obstruction as needed for comfort.   Comments Nurse may pronounce      11/21/15 1424    Advance Directive Documentation        Most Recent Value   Type of Advance Directive  Healthcare Power of Attorney   Pre-existing out of facility DNR order (yellow form or pink MOST form)     "MOST" Form in Place?        TOTAL TIME TAKING CARE OF THIS PATIENT: 37 minutes.    Gladstone Lighter M.D on 11/26/2015 at 7:41 AM  Between 7am to 6pm - Pager - [phone]  After 6pm go to [email or web address] - password [email or web address] [facility]  Office  [phone]  CC: Primary care physician; Pcp Not In System

## 2015-11-26 NOTE — Progress Notes (Signed)
VSS. Denies pain. Educated pt's daughter about diabetes. Diabetes book given. Pt's daughter verbalized understanding. Pt is discharged to [facility]. Report given to [facility], RN. Awaiting EMS for transport.

## 2015-11-26 NOTE — Clinical Social Work Note (Signed)
Pt is ready for discharge today and pt will discharge to [facility]. Facility has received discharge information and is ready to admit pt. Pt and adult children are aware and agreeable to discharge plan. RN will call report and EMS will provide transportation. CSW is signing off as no further needs identified.   Darden Dates, MSW, LCSW Clinical Social Worker  [phone]

## 2015-11-26 NOTE — Progress Notes (Signed)
Discussed diabetes management with patient's daughter.  She states patient had been on insulin up until 1 year and 1/2 ago when he wanted to stop taking.  Patient is to be D/c'd to SNF for rehab, however he will return home with her.  Demonstrated use of insulin pen to patient including placement of needle, 2 unit prime, administration of insulin and holding for 10 seconds.  Daughter successfully returned demonstration in practice insulin cube.  Discussed storage of insulin pens and reviewed survival skills including hypoglycemia, hyperglycemia, monitoring, normal blood glucose levels and when to call MD.  She also was given insulin starter kit and "Living well with diabetes booklet".  Thanks, Adah Perl, RN, BC-ADM Inpatient Diabetes Coordinator Pager [phone] (8a-5p)

## 2015-11-26 NOTE — Progress Notes (Signed)
No complaints of pain this shift. VSS Pt awaiting rehab. Diabetes packet in pt bin and is to be reviewed with pts daughter when she arrives.

## 2015-12-01 ENCOUNTER — Other Ambulatory Visit: Payer: Self-pay | Admitting: *Deleted

## 2015-12-01 DIAGNOSIS — C9110 Chronic lymphocytic leukemia of B-cell type not having achieved remission: Secondary | ICD-10-CM

## 2015-12-04 ENCOUNTER — Other Ambulatory Visit: Payer: Self-pay | Admitting: *Deleted

## 2015-12-04 ENCOUNTER — Telehealth: Payer: Self-pay | Admitting: *Deleted

## 2015-12-04 DIAGNOSIS — C9110 Chronic lymphocytic leukemia of B-cell type not having achieved remission: Secondary | ICD-10-CM

## 2015-12-04 NOTE — Telephone Encounter (Signed)
Son wanted to let md know that his Dad was d/c to rehab at [facility]. He is eating good, getting a little better with PT.  Wants to know he could survive a treatment for his CLL.  When I spoke to him about the appt for Monday the son states that he never got the message about it and said it would be fine for the appt.  I told him that some of the answer would be what his labs looked like, then if pt wants treatment and one of the things Dr. B wanted was for pt, son and daughter to all be together at same time and go over everything and discuss labs and decide upon labs if treatment is warranted and if pt wants it.  Son wanted to know if [facility] has been notified about the appt and I told him I would call them to let them know.  When I called there is no answer there and I told son I would try in am to notify them of the appt,  He was agreeable to the plan

## 2015-12-05 ENCOUNTER — Telehealth: Payer: Self-pay | Admitting: *Deleted

## 2015-12-05 NOTE — Telephone Encounter (Signed)
Called SNF and spoke to transportation staff and they did not have the appt for pt on 12/19 2:15 for labs and 2:30 for md.  They now have it on the books and they will transport pt on monday

## 2015-12-08 ENCOUNTER — Encounter: Payer: Self-pay | Admitting: Internal Medicine

## 2015-12-08 ENCOUNTER — Inpatient Hospital Stay: Payer: No Typology Code available for payment source | Attending: Internal Medicine

## 2015-12-08 ENCOUNTER — Inpatient Hospital Stay (HOSPITAL_BASED_OUTPATIENT_CLINIC_OR_DEPARTMENT_OTHER): Payer: No Typology Code available for payment source | Admitting: Internal Medicine

## 2015-12-08 VITALS — BP 113/77 | HR 106 | Temp 97.0°F | Resp 17 | Ht 69.0 in | Wt 100.0 lb

## 2015-12-08 DIAGNOSIS — Z9181 History of falling: Secondary | ICD-10-CM | POA: Insufficient documentation

## 2015-12-08 DIAGNOSIS — R2681 Unsteadiness on feet: Secondary | ICD-10-CM

## 2015-12-08 DIAGNOSIS — H9202 Otalgia, left ear: Secondary | ICD-10-CM | POA: Diagnosis not present

## 2015-12-08 DIAGNOSIS — S42302S Unspecified fracture of shaft of humerus, left arm, sequela: Secondary | ICD-10-CM

## 2015-12-08 DIAGNOSIS — R64 Cachexia: Secondary | ICD-10-CM | POA: Diagnosis not present

## 2015-12-08 DIAGNOSIS — C9112 Chronic lymphocytic leukemia of B-cell type in relapse: Secondary | ICD-10-CM | POA: Diagnosis not present

## 2015-12-08 DIAGNOSIS — Z79899 Other long term (current) drug therapy: Secondary | ICD-10-CM | POA: Insufficient documentation

## 2015-12-08 DIAGNOSIS — Z9221 Personal history of antineoplastic chemotherapy: Secondary | ICD-10-CM

## 2015-12-08 DIAGNOSIS — E1165 Type 2 diabetes mellitus with hyperglycemia: Secondary | ICD-10-CM

## 2015-12-08 DIAGNOSIS — W19XXXS Unspecified fall, sequela: Secondary | ICD-10-CM | POA: Insufficient documentation

## 2015-12-08 DIAGNOSIS — H6123 Impacted cerumen, bilateral: Secondary | ICD-10-CM

## 2015-12-08 DIAGNOSIS — C9110 Chronic lymphocytic leukemia of B-cell type not having achieved remission: Secondary | ICD-10-CM

## 2015-12-08 DIAGNOSIS — Z794 Long term (current) use of insulin: Secondary | ICD-10-CM

## 2015-12-08 LAB — LACTATE DEHYDROGENASE: LDH: 199 U/L — ABNORMAL HIGH (ref 98–192)

## 2015-12-08 LAB — CBC WITH DIFFERENTIAL/PLATELET
Basophils Absolute: 0.2 K/uL — ABNORMAL HIGH (ref 0–0.1)
Basophils Relative: 0 %
Eosinophils Absolute: 0.5 K/uL (ref 0–0.7)
Eosinophils Relative: 0 %
HCT: 33.8 % — ABNORMAL LOW (ref 40.0–52.0)
Hemoglobin: 10.8 g/dL — ABNORMAL LOW (ref 13.0–18.0)
Lymphocytes Relative: 96 %
Lymphs Abs: 171.1 K/uL — ABNORMAL HIGH (ref 1.0–3.6)
MCH: 28.2 pg (ref 26.0–34.0)
MCHC: 32.1 g/dL (ref 32.0–36.0)
MCV: 87.9 fL (ref 80.0–100.0)
Monocytes Absolute: 1.4 K/uL — ABNORMAL HIGH (ref 0.2–1.0)
Monocytes Relative: 1 %
Neutro Abs: 5.4 K/uL (ref 1.4–6.5)
Neutrophils Relative %: 3 %
Platelets: 237 K/uL (ref 150–440)
RBC: 3.85 MIL/uL — ABNORMAL LOW (ref 4.40–5.90)
RDW: 16 % — ABNORMAL HIGH (ref 11.5–14.5)
WBC: 178.6 K/uL (ref 3.8–10.6)

## 2015-12-08 LAB — COMPREHENSIVE METABOLIC PANEL
ALT: 15 U/L — ABNORMAL LOW (ref 17–63)
AST: 22 U/L (ref 15–41)
Albumin: 3.4 g/dL — ABNORMAL LOW (ref 3.5–5.0)
Alkaline Phosphatase: 200 U/L — ABNORMAL HIGH (ref 38–126)
Anion gap: 7 (ref 5–15)
BUN: 32 mg/dL — ABNORMAL HIGH (ref 6–20)
CO2: 27 mmol/L (ref 22–32)
Calcium: 8.2 mg/dL — ABNORMAL LOW (ref 8.9–10.3)
Chloride: 95 mmol/L — ABNORMAL LOW (ref 101–111)
Creatinine, Ser: 0.76 mg/dL (ref 0.61–1.24)
GFR calc Af Amer: >60 mL/min (ref >60)
GFR calc non Af Amer: >60 mL/min (ref >60)
Glucose, Bld: 374 mg/dL — ABNORMAL HIGH (ref 65–99)
Potassium: 4.8 mmol/L (ref 3.5–5.1)
Sodium: 129 mmol/L — ABNORMAL LOW (ref 135–145)
Total Bilirubin: 0.6 mg/dL (ref 0.3–1.2)
Total Protein: 6.2 g/dL — ABNORMAL LOW (ref 6.5–8.1)

## 2015-12-08 MED ORDER — IBRUTINIB 140 MG PO CAPS: 280.0000 mg | ORAL_CAPSULE | Freq: Every day | ORAL | Status: DC

## 2015-12-08 NOTE — Progress Notes (Signed)
Pt reports having a fall 2 weeks ago and broke left shoulder.  Pt has weight loss since last weight check of about 7 lbs.  Pt reports left ear feeling stopped up or closed.  Pt also reports having pain in buttocks area from sitting.

## 2015-12-08 NOTE — Progress Notes (Signed)
[facility] OFFICE PROGRESS NOTE  Patient Care Team: Pcp Not In System as PCP - General   SUMMARY OF ONCOLOGIC HISTORY:  # [name] s/p Rituxan; NOV 2016- Recurrence; DEC 2016-CT A/P-- Bulky confluent retroperitoneal, mesenteric and bilateral pelvic lymphadenopathy, mildly increased since 2012]. DEC 2016- START IBRUTINIB 280mg /d  # Poorly controlled DM/ cachexia/Left humerus fracture s/p fall [dec 2016]  INTERVAL HISTORY:  81 year old male patient with above history of CLL treated 2-3 years ago with single agent Rituxan; who has been on hospice at home because of overall poor health was recently evaluated in the hospital for recurrence. Patient actually had fallen down multiple times in the last many months; he had actually fractured his left humerus; needing admission to the hospital. Patient's white count was 1 20,000; hemoglobin around 7-8 platelets ~ 80s. Patient received blood transfusion in the hospital; he is currently in rehabilitation.  Patient is a poor historian; as per the daughter patient's appetite is good. However he is still unsteady on his feet. No nausea no vomiting. No fevers. Patient complains of left ear ache. He is still in his left upper extremity sling.   REVIEW OF SYSTEMS:  A complete 10 point review of system is done which is negative except mentioned above/history of present illness.   PAST MEDICAL HISTORY :  Past Medical History  Diagnosis Date  . Diabetes mellitus without complication ([place])   . Leukemia ([place])     PAST SURGICAL HISTORY :  No past surgical history on file.  FAMILY HISTORY :   Family History  Problem Relation Age of Onset  . Cancer Sister     SOCIAL HISTORY:   Social History  Substance Use Topics  . Smoking status: Never Smoker   . Smokeless tobacco: Not on file  . Alcohol Use: No    ALLERGIES:  has No Known Allergies.  MEDICATIONS:  Current Outpatient Prescriptions  Medication Sig Dispense Refill  . alum &  mag hydroxide-simeth (MAALOX/MYLANTA) 200-200-20 MG/5ML suspension Take 30 mLs by mouth every 6 (six) hours as needed for indigestion or heartburn (dyspepsia). 355 mL 0  . feeding supplement, ENSURE ENLIVE, (ENSURE ENLIVE) LIQD Take 237 mLs by mouth 3 (three) times daily with meals. 237 mL 12  . insulin aspart (NOVOLOG) 100 UNIT/ML injection Inject 4 Units into the skin 3 (three) times daily with meals. 10 mL 11  . insulin detemir (LEVEMIR) 100 UNIT/ML injection Inject 0.18 mLs (18 Units total) into the skin at bedtime. 10 mL 11  . traMADol (ULTRAM) 50 MG tablet Take 1 tablet (50 mg total) by mouth every 6 (six) hours as needed for moderate pain. 30 tablet 0   No current facility-administered medications for this visit.    PHYSICAL EXAMINATION: ECOG PERFORMANCE STATUS: 3 - Symptomatic, >50% confined to bed  BP 113/77 mmHg  Pulse 106  Temp(Src) 97 F (36.1 C) (Tympanic)  Resp 17  Ht 5\' 9"  (1.753 m)  Wt 100 lb (45.36 kg)  BMI 14.76 kg/m2  SpO2 96%  Filed Weights   12/08/15 1453  Weight: 100 lb (45.36 kg)    GENERAL: Cachectic appearing Caucasian male patient Alert, no distress and comfortable.   He is in a wheelchair. Accompanied by his daughter. EYES: no pallor or icterus; bilateral ear wax. OROPHARYNX: no thrush or ulceration; good dentition  NECK: supple, no masses felt LYMPH: positive for palpable lymphadenopathy in the  bil axillary; inguinal regions cannot be examined LUNGS: clear to auscultation and  No wheeze or  crackles HEART/CVS: regular rate & rhythm and no murmurs; No lower extremity edema ABDOMEN:abdomen soft, non-tender and normal bowel sounds Musculoskeletal:no cyanosis of digits and no clubbing  PSYCH: alert & oriented x 3 with fluent speech NEURO: no focal motor/sensory deficits SKIN:  no rashes or significant lesions   LABORATORY DATA:  I have reviewed the data as listed    Component Value Date/Time   NA 129* 12/08/2015 1424   NA 143 06/04/2012 1240   K  4.8 12/08/2015 1424   K 4.2 06/04/2012 1240   CL 95* 12/08/2015 1424   CL 106 06/04/2012 1240   CO2 27 12/08/2015 1424   CO2 29 06/04/2012 1240   GLUCOSE 374* 12/08/2015 1424   GLUCOSE 208* 06/04/2012 1240   BUN 32* 12/08/2015 1424   BUN 26* 06/04/2012 1240   CREATININE 0.76 12/08/2015 1424   CREATININE 0.82 06/04/2012 1240   CALCIUM 8.2* 12/08/2015 1424   CALCIUM 8.6 06/04/2012 1240   PROT 6.2* 12/08/2015 1424   PROT 6.0* 06/04/2012 1240   ALBUMIN 3.4* 12/08/2015 1424   ALBUMIN 3.4 06/04/2012 1240   AST 22 12/08/2015 1424   AST 11* 06/04/2012 1240   ALT 15* 12/08/2015 1424   ALT 16 06/04/2012 1240   ALKPHOS 200* 12/08/2015 1424   ALKPHOS 79 06/04/2012 1240   BILITOT 0.6 12/08/2015 1424   BILITOT 1.1* 06/04/2012 1240   GFRNONAA >60 12/08/2015 1424   GFRNONAA >60 06/04/2012 1240   GFRNONAA >60 02/14/2012 0854   GFRAA >60 12/08/2015 1424   GFRAA >60 06/04/2012 1240   GFRAA >60 02/14/2012 0854    No results found for: SPEP, UPEP  Lab Results  Component Value Date   WBC 178.6* 12/08/2015   NEUTROABS 5.4 12/08/2015   HGB 10.8* 12/08/2015   HCT 33.8* 12/08/2015   MCV 87.9 12/08/2015   PLT 237 12/08/2015      Chemistry      Component Value Date/Time   NA 129* 12/08/2015 1424   NA 143 06/04/2012 1240   K 4.8 12/08/2015 1424   K 4.2 06/04/2012 1240   CL 95* 12/08/2015 1424   CL 106 06/04/2012 1240   CO2 27 12/08/2015 1424   CO2 29 06/04/2012 1240   BUN 32* 12/08/2015 1424   BUN 26* 06/04/2012 1240   CREATININE 0.76 12/08/2015 1424   CREATININE 0.82 06/04/2012 1240      Component Value Date/Time   CALCIUM 8.2* 12/08/2015 1424   CALCIUM 8.6 06/04/2012 1240   ALKPHOS 200* 12/08/2015 1424   ALKPHOS 79 06/04/2012 1240   AST 22 12/08/2015 1424   AST 11* 06/04/2012 1240   ALT 15* 12/08/2015 1424   ALT 16 06/04/2012 1240   BILITOT 0.6 12/08/2015 1424   BILITOT 1.1* 06/04/2012 1240       RADIOGRAPHIC STUDIES: I have personally reviewed the radiological  images as listed and agreed with the findings in the report. No results found.   ASSESSMENT & PLAN:   # CLL- recurrence/anemia with thrombocytopenia- patient is symptomatic and would benefit from therapy.  Patient has been reluctant all along; however today he is agreeable to move forward with therapy. I would recommend ibrutinib 280 mg once a day [dose reduced] Given his multiple comorbidities. We went over the potential side effects including but not limited to atrial fibrillation, diarrhea Skin rash etc. I also discussed the risk of elevated lymphocyte count.   # Patient's - white count today is 1 70,000; which is expected to increase while on treatment. I  will recheck the CBC in approximately 2 weeks. If patient's counts are significantly elevated- I would recommend reinstitution of rituximab/ordered for 3 weeks/next visit with me.  # I had a long discussion the patient's daughter- that he might have some benefit of the treatment. However, all of patient's current symptoms might not be attributable to CLL itself. Patient is poorly controlled diabetes; which is again a biggest concern at this time. Patient's folate and  # Patient's hemoglobin is 10 today/platelets 273-normal.  # Bilateral ear wax- cerumen impaction/recommend talking to rehabilitation physician  # Patient will follow-up with me in approximately 3 weeks/possible rituximab at that time. Check CBC CMP in 2 weeks.   # 40 minutes face-to-face with the patient discussing the above plan of care; more than 50% of time spent on prognosis/ natural history; counseling and coordination. The patient's daughter/son at length. They agree.          Cammie Sickle, MD 12/08/2015 3:08 PM

## 2015-12-09 LAB — HEPATITIS B SURFACE ANTIGEN: HEP B S AG: NEGATIVE

## 2015-12-19 ENCOUNTER — Telehealth: Payer: Self-pay | Admitting: *Deleted

## 2015-12-19 NOTE — Telephone Encounter (Signed)
Spoke with [employer]. Patient does not have medicare part D. Current insurance medicare A/B will not cover the patient's RX for Ibrutinib.  [employer] recommended that the patient complete an application for the [employer] and [employer] [facility] [place] for finacial medication assistance.  [employer] to fax over application to cancer center to complete; however, the process of approval could take 2-3 weeks (which will ultimately delay drug dispensing process).  Form received on 12/19/15 by 1700. Made an attempt to contact patient at that time and was unsuccessful for getting in touch with patient.  Attempted to contact patient again on Tuesday 12/27. No answer.  RN Contacted patient's son, Remo Lipps.  Explained to the patient's son that since pt does not have Medicare part D the Ibrutinib will not be covered on current insurance policy. However, the patient can apply for financial assistance through [place] and [place]. I asked the son if he could come to the cancer center with his father to complete the application or take the application home for he and his father to complete. He states that he would come to the [place] cancer center to pick up the application since Dr. Aletha Halim team was at that location today.  Son never came to pick up application at that time.  Magda Paganini, NP signed the MD portion of the application on behalf of Dr. Rogue Bussing.  On 12/19/15, the patient's son came to the cancer center with a completed application from [place] and [place]. He stated that he was able to get the application from the Internet and made sure it was the right one that I needed. His father was able to sign the application and allowed his son to be a primary spokes person to [place] and [place] on his behalf. This application, along with the patient's demographics, RX for ibrutinib, insurance cards and the medication list was sent to the [place] and [place]. I let the patient's son know the application process  still may take 2-3 weeks before patient can receive medications.  Remo Lipps gave verbal understanding and thanked me for helping his dad.

## 2015-12-23 ENCOUNTER — Other Ambulatory Visit: Payer: Self-pay | Admitting: *Deleted

## 2015-12-23 DIAGNOSIS — C9110 Chronic lymphocytic leukemia of B-cell type not having achieved remission: Secondary | ICD-10-CM

## 2015-12-24 ENCOUNTER — Encounter: Payer: Self-pay | Admitting: *Deleted

## 2015-12-24 ENCOUNTER — Inpatient Hospital Stay: Payer: Medicare Other | Attending: Internal Medicine

## 2015-12-24 DIAGNOSIS — Z5112 Encounter for antineoplastic immunotherapy: Secondary | ICD-10-CM | POA: Diagnosis not present

## 2015-12-24 DIAGNOSIS — Z8781 Personal history of (healed) traumatic fracture: Secondary | ICD-10-CM | POA: Diagnosis not present

## 2015-12-24 DIAGNOSIS — Z794 Long term (current) use of insulin: Secondary | ICD-10-CM | POA: Insufficient documentation

## 2015-12-24 DIAGNOSIS — Z79899 Other long term (current) drug therapy: Secondary | ICD-10-CM | POA: Diagnosis not present

## 2015-12-24 DIAGNOSIS — E119 Type 2 diabetes mellitus without complications: Secondary | ICD-10-CM | POA: Insufficient documentation

## 2015-12-24 DIAGNOSIS — C9110 Chronic lymphocytic leukemia of B-cell type not having achieved remission: Secondary | ICD-10-CM | POA: Diagnosis not present

## 2015-12-24 DIAGNOSIS — Z9181 History of falling: Secondary | ICD-10-CM | POA: Insufficient documentation

## 2015-12-24 LAB — CBC WITH DIFFERENTIAL/PLATELET
BAND NEUTROPHILS: 1 %
BASOS PCT: 0 %
Basophils Absolute: 0 10*3/uL (ref 0–0.1)
EOS ABS: 0 10*3/uL (ref 0–0.7)
EOS PCT: 0 %
HEMATOCRIT: 32.7 % — AB (ref 40.0–52.0)
Hemoglobin: 10.3 g/dL — ABNORMAL LOW (ref 13.0–18.0)
LYMPHS PCT: 94 %
Lymphs Abs: 152.6 10*3/uL — ABNORMAL HIGH (ref 1.0–3.6)
MCH: 28 pg (ref 26.0–34.0)
MCHC: 31.5 g/dL — ABNORMAL LOW (ref 32.0–36.0)
MCV: 88.9 fL (ref 80.0–100.0)
MONOS PCT: 3 %
Monocytes Absolute: 4.9 10*3/uL — ABNORMAL HIGH (ref 0.2–1.0)
NEUTROS ABS: 4.9 10*3/uL (ref 1.4–6.5)
Neutrophils Relative %: 2 %
Platelets: 174 10*3/uL (ref 150–440)
RBC: 3.68 MIL/uL — ABNORMAL LOW (ref 4.40–5.90)
RDW: 16.6 % — AB (ref 11.5–14.5)
SMEAR REVIEW: ADEQUATE
WBC: 162.4 10*3/uL (ref 3.8–10.6)

## 2015-12-24 LAB — BASIC METABOLIC PANEL
ANION GAP: 6 (ref 5–15)
BUN: 31 mg/dL — ABNORMAL HIGH (ref 6–20)
CALCIUM: 8.2 mg/dL — AB (ref 8.9–10.3)
CO2: 26 mmol/L (ref 22–32)
Chloride: 97 mmol/L — ABNORMAL LOW (ref 101–111)
Creatinine, Ser: 0.66 mg/dL (ref 0.61–1.24)
GLUCOSE: 386 mg/dL — AB (ref 65–99)
POTASSIUM: 5.1 mmol/L (ref 3.5–5.1)
Sodium: 129 mmol/L — ABNORMAL LOW (ref 135–145)

## 2015-12-24 NOTE — Progress Notes (Signed)
Received notification from [facility] requesting provider license #. This was provided and sent back via fax to [employer] and [employer] for patient co-pay assistance. The copay assistance fund would like a copy of the patient's insurance card. Explained to [employer] that the patient's card was not on file in the cancer center; however, a copy of the insurance verification and insurance card/policy numbers was previously faxed with the application. If the card is needed, then we can attempt to reach back to the patient/caregiver for this information. Pending [employer]' reply.

## 2015-12-25 ENCOUNTER — Other Ambulatory Visit: Payer: Self-pay | Admitting: Internal Medicine

## 2015-12-25 DIAGNOSIS — M7989 Other specified soft tissue disorders: Secondary | ICD-10-CM

## 2015-12-29 ENCOUNTER — Inpatient Hospital Stay: Payer: Medicare Other | Admitting: Internal Medicine

## 2015-12-29 ENCOUNTER — Inpatient Hospital Stay: Payer: Medicare Other

## 2015-12-30 ENCOUNTER — Encounter: Payer: Self-pay | Admitting: *Deleted

## 2015-12-30 ENCOUNTER — Ambulatory Visit: Payer: Medicare Other

## 2015-12-30 NOTE — Progress Notes (Signed)
Scheduled sent msg to contact patient's son to change patient's apt from 12/29/15 (Due to inclement weather). Per Dr. Consuello Masse requested that pt's apt be changed to another day this week (whatever day is best for for infusion's schedule) with lab/md/Rituxan.  Patient has not been able to start the ibrutinib due to the need for financial assistance (which is still pending).

## 2016-01-02 ENCOUNTER — Inpatient Hospital Stay: Payer: Medicare Other | Admitting: Internal Medicine

## 2016-01-02 ENCOUNTER — Inpatient Hospital Stay: Payer: Medicare Other

## 2016-01-12 ENCOUNTER — Emergency Department: Payer: Medicare Other

## 2016-01-12 ENCOUNTER — Inpatient Hospital Stay: Payer: Medicare Other

## 2016-01-12 ENCOUNTER — Other Ambulatory Visit: Payer: Self-pay | Admitting: Internal Medicine

## 2016-01-12 ENCOUNTER — Inpatient Hospital Stay (HOSPITAL_BASED_OUTPATIENT_CLINIC_OR_DEPARTMENT_OTHER): Payer: Medicare Other | Admitting: Internal Medicine

## 2016-01-12 ENCOUNTER — Emergency Department
Admission: EM | Admit: 2016-01-12 | Discharge: 2016-01-12 | Disposition: A | Discharge status: Home / Self Care | Payer: Medicare Other | Attending: Emergency Medicine | Admitting: Emergency Medicine

## 2016-01-12 VITALS — BP 108/69 | HR 93 | Temp 98.6°F

## 2016-01-12 VITALS — BP 101/66 | HR 89 | Resp 20

## 2016-01-12 DIAGNOSIS — R339 Retention of urine, unspecified: Secondary | ICD-10-CM | POA: Insufficient documentation

## 2016-01-12 DIAGNOSIS — Z794 Long term (current) use of insulin: Secondary | ICD-10-CM | POA: Insufficient documentation

## 2016-01-12 DIAGNOSIS — C9110 Chronic lymphocytic leukemia of B-cell type not having achieved remission: Secondary | ICD-10-CM

## 2016-01-12 DIAGNOSIS — R4182 Altered mental status, unspecified: Secondary | ICD-10-CM | POA: Insufficient documentation

## 2016-01-12 DIAGNOSIS — R7989 Other specified abnormal findings of blood chemistry: Secondary | ICD-10-CM | POA: Diagnosis not present

## 2016-01-12 DIAGNOSIS — J0100 Acute maxillary sinusitis, unspecified: Secondary | ICD-10-CM | POA: Diagnosis not present

## 2016-01-12 DIAGNOSIS — M79601 Pain in right arm: Secondary | ICD-10-CM | POA: Insufficient documentation

## 2016-01-12 DIAGNOSIS — J320 Chronic maxillary sinusitis: Secondary | ICD-10-CM

## 2016-01-12 DIAGNOSIS — R7309 Other abnormal glucose: Secondary | ICD-10-CM

## 2016-01-12 DIAGNOSIS — L409 Psoriasis, unspecified: Secondary | ICD-10-CM | POA: Insufficient documentation

## 2016-01-12 DIAGNOSIS — Z9181 History of falling: Secondary | ICD-10-CM

## 2016-01-12 DIAGNOSIS — E119 Type 2 diabetes mellitus without complications: Secondary | ICD-10-CM

## 2016-01-12 DIAGNOSIS — Z79899 Other long term (current) drug therapy: Secondary | ICD-10-CM

## 2016-01-12 DIAGNOSIS — R778 Other specified abnormalities of plasma proteins: Secondary | ICD-10-CM

## 2016-01-12 DIAGNOSIS — Z8781 Personal history of (healed) traumatic fracture: Secondary | ICD-10-CM

## 2016-01-12 DIAGNOSIS — Z5112 Encounter for antineoplastic immunotherapy: Secondary | ICD-10-CM | POA: Diagnosis not present

## 2016-01-12 DIAGNOSIS — T7840XA Allergy, unspecified, initial encounter: Secondary | ICD-10-CM

## 2016-01-12 HISTORY — DX: Chronic lymphocytic leukemia of B-cell type not having achieved remission: C91.10

## 2016-01-12 LAB — CBC
HEMATOCRIT: 37.1 % — AB (ref 40.0–52.0)
HEMOGLOBIN: 11.4 g/dL — AB (ref 13.0–18.0)
MCH: 27.4 pg (ref 26.0–34.0)
MCHC: 30.7 g/dL — AB (ref 32.0–36.0)
MCV: 89.3 fL (ref 80.0–100.0)
Platelets: 112 10*3/uL — ABNORMAL LOW (ref 150–440)
RBC: 4.15 MIL/uL — ABNORMAL LOW (ref 4.40–5.90)
RDW: 15.8 % — ABNORMAL HIGH (ref 11.5–14.5)
WBC: 77.3 10*3/uL (ref 3.8–10.6)

## 2016-01-12 LAB — HEPATIC FUNCTION PANEL
ALBUMIN: 2.4 g/dL — AB (ref 3.5–5.0)
ALT: 17 U/L (ref 17–63)
AST: 59 U/L — AB (ref 15–41)
Alkaline Phosphatase: 120 U/L (ref 38–126)
BILIRUBIN INDIRECT: 0.3 mg/dL (ref 0.3–0.9)
Bilirubin, Direct: 0.3 mg/dL (ref 0.1–0.5)
TOTAL PROTEIN: 4.7 g/dL — AB (ref 6.5–8.1)
Total Bilirubin: 0.6 mg/dL (ref 0.3–1.2)

## 2016-01-12 LAB — COMPREHENSIVE METABOLIC PANEL
ALBUMIN: 3.1 g/dL — AB (ref 3.5–5.0)
ALT: 18 U/L (ref 17–63)
AST: 23 U/L (ref 15–41)
Alkaline Phosphatase: 157 U/L — ABNORMAL HIGH (ref 38–126)
Anion gap: 8 (ref 5–15)
BILIRUBIN TOTAL: 0.6 mg/dL (ref 0.3–1.2)
BUN: 28 mg/dL — AB (ref 6–20)
CO2: 23 mmol/L (ref 22–32)
CREATININE: 0.57 mg/dL — AB (ref 0.61–1.24)
Calcium: 8.4 mg/dL — ABNORMAL LOW (ref 8.9–10.3)
Chloride: 102 mmol/L (ref 101–111)
GFR calc Af Amer: >60 mL/min (ref >60)
GLUCOSE: 242 mg/dL — AB (ref 65–99)
POTASSIUM: 3.8 mmol/L (ref 3.5–5.1)
Sodium: 133 mmol/L — ABNORMAL LOW (ref 135–145)
TOTAL PROTEIN: 6.1 g/dL — AB (ref 6.5–8.1)

## 2016-01-12 LAB — TROPONIN I: Troponin I: 0.05 ng/mL — ABNORMAL HIGH (ref <0.031)

## 2016-01-12 LAB — BASIC METABOLIC PANEL
ANION GAP: 8 (ref 5–15)
BUN: 26 mg/dL — AB (ref 6–20)
CO2: 20 mmol/L — ABNORMAL LOW (ref 22–32)
Calcium: 6.7 mg/dL — ABNORMAL LOW (ref 8.9–10.3)
Chloride: 113 mmol/L — ABNORMAL HIGH (ref 101–111)
Creatinine, Ser: 0.54 mg/dL — ABNORMAL LOW (ref 0.61–1.24)
Glucose, Bld: 259 mg/dL — ABNORMAL HIGH (ref 65–99)
POTASSIUM: 4.6 mmol/L (ref 3.5–5.1)
SODIUM: 141 mmol/L (ref 135–145)

## 2016-01-12 LAB — CBC WITH DIFFERENTIAL/PLATELET
BASOS ABS: 0.4 10*3/uL — AB (ref 0–0.1)
Eosinophils Absolute: 1.1 10*3/uL — ABNORMAL HIGH (ref 0–0.7)
Eosinophils Relative: 1 %
HEMATOCRIT: 34.3 % — AB (ref 40.0–52.0)
HEMOGLOBIN: 11 g/dL — AB (ref 13.0–18.0)
Lymphs Abs: 140.3 10*3/uL — ABNORMAL HIGH (ref 1.0–3.6)
MCH: 27.9 pg (ref 26.0–34.0)
MCHC: 31.9 g/dL — ABNORMAL LOW (ref 32.0–36.0)
MCV: 87.5 fL (ref 80.0–100.0)
MONO ABS: 2 10*3/uL — AB (ref 0.2–1.0)
Monocytes Relative: 1 %
NEUTROS ABS: 7.1 10*3/uL — AB (ref 1.4–6.5)
Platelets: 153 10*3/uL (ref 150–440)
RBC: 3.92 MIL/uL — AB (ref 4.40–5.90)
RDW: 15.5 % — AB (ref 11.5–14.5)
WBC: 150.8 10*3/uL — AB (ref 3.8–10.6)

## 2016-01-12 LAB — URINALYSIS COMPLETE WITH MICROSCOPIC (ARMC ONLY)
Bacteria, UA: NONE SEEN
Bilirubin Urine: NEGATIVE
Glucose, UA: 50 mg/dL — AB
Hgb urine dipstick: NEGATIVE
KETONES UR: NEGATIVE mg/dL
LEUKOCYTES UA: NEGATIVE
Nitrite: NEGATIVE
PROTEIN: 100 mg/dL — AB
SPECIFIC GRAVITY, URINE: 1.027 (ref 1.005–1.030)
SQUAMOUS EPITHELIAL / LPF: NONE SEEN
pH: 5 (ref 5.0–8.0)

## 2016-01-12 LAB — GLUCOSE, RANDOM: Glucose, Bld: 286 mg/dL — ABNORMAL HIGH (ref 65–99)

## 2016-01-12 MED ORDER — SODIUM CHLORIDE 0.9 % IV BOLUS (SEPSIS)
1000.0000 mL | Freq: Once | INTRAVENOUS | Status: AC
  Administered 2016-01-12: 1000 mL via INTRAVENOUS

## 2016-01-12 MED ORDER — METHYLPREDNISOLONE SODIUM SUCC 125 MG IJ SOLR
125.0000 mg | Freq: Once | INTRAMUSCULAR | Status: AC | PRN
  Administered 2016-01-12: 125 mg via INTRAVENOUS

## 2016-01-12 MED ORDER — DIPHENHYDRAMINE HCL 25 MG PO CAPS
50.0000 mg | ORAL_CAPSULE | Freq: Once | ORAL | Status: AC
  Administered 2016-01-12: 50 mg via ORAL
  Filled 2016-01-12: qty 2

## 2016-01-12 MED ORDER — DIPHENHYDRAMINE HCL 50 MG/ML IJ SOLN
25.0000 mg | Freq: Once | INTRAMUSCULAR | Status: AC | PRN
  Administered 2016-01-12: 25 mg via INTRAVENOUS

## 2016-01-12 MED ORDER — ACETAMINOPHEN 325 MG PO TABS
650.0000 mg | ORAL_TABLET | Freq: Once | ORAL | Status: AC
Start: 2016-01-12 — End: 2016-01-12
  Administered 2016-01-12: 650 mg via ORAL
  Filled 2016-01-12: qty 2

## 2016-01-12 MED ORDER — SODIUM CHLORIDE 0.9 % IV SOLN
Freq: Once | INTRAVENOUS | Status: DC | PRN
  Filled 2016-01-12: qty 1000

## 2016-01-12 MED ORDER — SODIUM CHLORIDE 0.9 % IV SOLN
Freq: Once | INTRAVENOUS | Status: AC
  Administered 2016-01-12: 11:00:00 via INTRAVENOUS
  Filled 2016-01-12: qty 1000

## 2016-01-12 MED ORDER — FAMOTIDINE IN NACL 20-0.9 MG/50ML-% IV SOLN
20.0000 mg | Freq: Once | INTRAVENOUS | Status: AC | PRN
  Administered 2016-01-12: 20 mg via INTRAVENOUS

## 2016-01-12 MED ORDER — SODIUM CHLORIDE 0.9 % IV SOLN
375.0000 mg/m2 | Freq: Once | INTRAVENOUS | Status: AC
  Administered 2016-01-12: 600 mg via INTRAVENOUS
  Filled 2016-01-12: qty 50

## 2016-01-12 NOTE — Progress Notes (Signed)
1213: Patient c/o "fuzzy" vision.  Rituxan stopped.  Dr. Rogue Bussing notified.  Patient denied any SOB.  Became diaphoretic. C/O having the need to urinate.  Patient already with depends on.1240:Patient transferred to ED.  Still c/o need to urinate and still with "fuzzy" vision.

## 2016-01-12 NOTE — Patient Instructions (Signed)
Rituximab injection What is this medicine? RITUXIMAB (ri TUX i mab) is a monoclonal antibody. It is used commonly to treat non-Hodgkin lymphoma and other conditions. It is also used to treat rheumatoid arthritis (RA). In RA, this medicine slows the inflammatory process and help reduce joint pain and swelling. This medicine is often used with other cancer or arthritis medications. This medicine may be used for other purposes; ask your health care provider or pharmacist if you have questions. What should I tell my health care provider before I take this medicine? They need to know if you have any of these conditions: -blood disorders -heart disease -history of hepatitis B -infection (especially a virus infection such as chickenpox, cold sores, or herpes) -irregular heartbeat -kidney disease -lung or breathing disease, like asthma -lupus -an unusual or allergic reaction to rituximab, mouse proteins, other medicines, foods, dyes, or preservatives -pregnant or trying to get pregnant -breast-feeding How should I use this medicine? This medicine is for infusion into a vein. It is administered in a hospital or clinic by a specially trained health care professional. A special MedGuide will be given to you by the pharmacist with each prescription and refill. Be sure to read this information carefully each time. Talk to your pediatrician regarding the use of this medicine in children. This medicine is not approved for use in children. Overdosage: If you think you have taken too much of this medicine contact a poison control center or emergency room at once. NOTE: This medicine is only for you. Do not share this medicine with others. What if I miss a dose? It is important not to miss a dose. Call your doctor or health care professional if you are unable to keep an appointment. What may interact with this medicine? -cisplatin -medicines for blood pressure -some other medicines for  arthritis -vaccines This list may not describe all possible interactions. Give your health care provider a list of all the medicines, herbs, non-prescription drugs, or dietary supplements you use. Also tell them if you smoke, drink alcohol, or use illegal drugs. Some items may interact with your medicine. What should I watch for while using this medicine? Report any side effects that you notice during your treatment right away, such as changes in your breathing, fever, chills, dizziness or lightheadedness. These effects are more common with the first dose. Visit your prescriber or health care professional for checks on your progress. You will need to have regular blood work. Report any other side effects. The side effects of this medicine can continue after you finish your treatment. Continue your course of treatment even though you feel ill unless your doctor tells you to stop. Call your doctor or health care professional for advice if you get a fever, chills or sore throat, or other symptoms of a cold or flu. Do not treat yourself. This drug decreases your body's ability to fight infections. Try to avoid being around people who are sick. This medicine may increase your risk to bruise or bleed. Call your doctor or health care professional if you notice any unusual bleeding. Be careful brushing and flossing your teeth or using a toothpick because you may get an infection or bleed more easily. If you have any dental work done, tell your dentist you are receiving this medicine. Avoid taking products that contain aspirin, acetaminophen, ibuprofen, naproxen, or ketoprofen unless instructed by your doctor. These medicines may hide a fever. Do not become pregnant while taking this medicine. Women should inform their doctor if   they wish to become pregnant or think they might be pregnant. There is a potential for serious side effects to an unborn child. Talk to your health care professional or pharmacist for more  information. Do not breast-feed an infant while taking this medicine. What side effects may I notice from receiving this medicine? Side effects that you should report to your doctor or health care professional as soon as possible: -allergic reactions like skin rash, itching or hives, swelling of the face, lips, or tongue -low blood counts - this medicine may decrease the number of white blood cells, red blood cells and platelets. You may be at increased risk for infections and bleeding. -signs of infection - fever or chills, cough, sore throat, pain or difficulty passing urine -signs of decreased platelets or bleeding - bruising, pinpoint red spots on the skin, black, tarry stools, blood in the urine -signs of decreased red blood cells - unusually weak or tired, fainting spells, lightheadedness -breathing problems -confused, not responsive -chest pain -fast, irregular heartbeat -feeling faint or lightheaded, falls -mouth sores -redness, blistering, peeling or loosening of the skin, including inside the mouth -stomach pain -swelling of the ankles, feet, or hands -trouble passing urine or change in the amount of urine Side effects that usually do not require medical attention (report to your doctor or other health care professional if they continue or are bothersome): -anxiety -headache -loss of appetite -muscle aches -nausea -night sweats This list may not describe all possible side effects. Call your doctor for medical advice about side effects. You may report side effects to FDA at 1-800-FDA-1088. Where should I keep my medicine? This drug is given in a hospital or clinic and will not be stored at home. NOTE: This sheet is a summary. It may not cover all possible information. If you have questions about this medicine, talk to your doctor, pharmacist, or health care provider.    2016, [employer]. (2015-02-12 22:30:56)  

## 2016-01-12 NOTE — ED Notes (Addendum)
Pt's son is at the bedside  Son states  "What are ya'll gonna do about him not being able to pee."  I stated that I was unaware of his inability to pee - son states "Well that's why he is here."  I explained that I understood him to arrive due to an interaction with his medication at the cancer center."    MD made aware

## 2016-01-12 NOTE — ED Notes (Signed)
[facility] Mcgu[facility] notified of troponin by this nurse.

## 2016-01-12 NOTE — ED Notes (Signed)
md is currently assessing pt

## 2016-01-12 NOTE — Progress Notes (Signed)
[facility] OFFICE PROGRESS NOTE  Patient Care Team: Pcp Not In System as PCP - General   SUMMARY OF ONCOLOGIC HISTORY:  # [name] s/p Rituxan; NOV 2016- Recurrence; DEC 2016-CT A/P-- Bulky confluent retroperitoneal, mesenteric and bilateral pelvic lymphadenopathy, mildly increased since 2012].   # Poorly controlled DM/ cachexia/Left humerus fracture s/p fall [dec 2016]; pt in hospice [2016]  INTERVAL HISTORY: Poor/vague historian  82 year old male patient with above history of CLL treated 2-3 years ago with single agent Rituxan- currently with recurrence is here to reinitiate treatment with rituximab. Patient was recommended ibrutinib- not filled yet because of insurance reasons.  Patient continues to be at the nursing home.; As per the family his PET sugars are better controlled. His appetite is fair. Weight can't be checked today because he couldn't stand.  Otherwise no fever no chills.  However he is still unsteady on his feet. No nausea no vomiting.He is still in his left upper extremity sling. No plans for any surgery of his left humerus fracture. As per the daughter- patient's left upper extremity dislocated/ needing repositioning in the nursing home.   REVIEW OF SYSTEMS:  A complete 10 point review of system is done which is negative except mentioned above/history of present illness.   PAST MEDICAL HISTORY :  Past Medical History  Diagnosis Date  . Diabetes mellitus without complication ([place])   . Leukemia ([place])     PAST SURGICAL HISTORY :  No past surgical history on file.  FAMILY HISTORY :   Family History  Problem Relation Age of Onset  . Cancer Sister     SOCIAL HISTORY:   Social History  Substance Use Topics  . Smoking status: Never Smoker   . Smokeless tobacco: Not on file  . Alcohol Use: No    ALLERGIES:  has No Known Allergies.  MEDICATIONS:  Current Outpatient Prescriptions  Medication Sig Dispense Refill  . alum & mag  hydroxide-simeth (MAALOX/MYLANTA) 200-200-20 MG/5ML suspension Take 30 mLs by mouth every 6 (six) hours as needed for indigestion or heartburn (dyspepsia). 355 mL 0  . feeding supplement, ENSURE ENLIVE, (ENSURE ENLIVE) LIQD Take 237 mLs by mouth 3 (three) times daily with meals. 237 mL 12  . ibrutinib (IMBRUVICA) 140 MG capsul Take 2 capsules (280 mg total) by mouth daily. Administer orally with water at approximately the same time every day 60 capsule 3  . insulin aspart (NOVOLOG) 100 UNIT/ML injection Inject 4 Units into the skin 3 (three) times daily with meals. 10 mL 11  . insulin detemir (LEVEMIR) 100 UNIT/ML injection Inject 0.18 mLs (18 Units total) into the skin at bedtime. 10 mL 11  . traMADol (ULTRAM) 50 MG tablet Take 1 tablet (50 mg total) by mouth every 6 (six) hours as needed for moderate pain. 30 tablet 0   No current facility-administered medications for this visit.    PHYSICAL EXAMINATION: ECOG PERFORMANCE STATUS: 3 - Symptomatic, >50% confined to bed  BP 108/69 mmHg  Pulse 93  Temp(Src) 98.6 F (37 C)  GENERAL: Cachectic appearing Caucasian male patient Alert, no distress and comfortable.   He is in a wheelchair. Accompanied by his daughter/son/wife.  EYES: no pallor or icterus; bilateral ear wax. OROPHARYNX: no thrush or ulceration; good dentition  NECK: supple, no masses felt LYMPH: positive for palpable lymphadenopathy in the  bil axillary; inguinal regions cannot be examined LUNGS: clear to auscultation and  No wheeze or crackles HEART/CVS: regular rate & rhythm and no murmurs; No lower  extremity edema ABDOMEN:abdomen soft, non-tender and normal bowel sounds Musculoskeletal:no cyanosis of digits and no clubbing  PSYCH: alert & oriented x 3 with fluent speech NEURO: no focal motor/sensory deficits SKIN:  no rashes or significant lesions   LABORATORY DATA:  I have reviewed the data as listed    Component Value Date/Time   NA 133* 01/12/2016 0939   NA 143  06/04/2012 1240   K 3.8 01/12/2016 0939   K 4.2 06/04/2012 1240   CL 102 01/12/2016 0939   CL 106 06/04/2012 1240   CO2 23 01/12/2016 0939   CO2 29 06/04/2012 1240   GLUCOSE 242* 01/12/2016 0939   GLUCOSE 208* 06/04/2012 1240   BUN 28* 01/12/2016 0939   BUN 26* 06/04/2012 1240   CREATININE 0.57* 01/12/2016 0939   CREATININE 0.82 06/04/2012 1240   CALCIUM 8.4* 01/12/2016 0939   CALCIUM 8.6 06/04/2012 1240   PROT 6.1* 01/12/2016 0939   PROT 6.0* 06/04/2012 1240   ALBUMIN 3.1* 01/12/2016 0939   ALBUMIN 3.4 06/04/2012 1240   AST 23 01/12/2016 0939   AST 11* 06/04/2012 1240   ALT 18 01/12/2016 0939   ALT 16 06/04/2012 1240   ALKPHOS 157* 01/12/2016 0939   ALKPHOS 79 06/04/2012 1240   BILITOT 0.6 01/12/2016 0939   BILITOT 1.1* 06/04/2012 1240   GFRNONAA >60 01/12/2016 0939   GFRNONAA >60 06/04/2012 1240   GFRNONAA >60 02/14/2012 0854   GFRAA >60 01/12/2016 0939   GFRAA >60 06/04/2012 1240   GFRAA >60 02/14/2012 0854    No results found for: SPEP, UPEP  Lab Results  Component Value Date   WBC 150.8* 01/12/2016   NEUTROABS 7.1* 01/12/2016   HGB 11.0* 01/12/2016   HCT 34.3* 01/12/2016   MCV 87.5 01/12/2016   PLT 153 01/12/2016      Chemistry      Component Value Date/Time   NA 133* 01/12/2016 0939   NA 143 06/04/2012 1240   K 3.8 01/12/2016 0939   K 4.2 06/04/2012 1240   CL 102 01/12/2016 0939   CL 106 06/04/2012 1240   CO2 23 01/12/2016 0939   CO2 29 06/04/2012 1240   BUN 28* 01/12/2016 0939   BUN 26* 06/04/2012 1240   CREATININE 0.57* 01/12/2016 0939   CREATININE 0.82 06/04/2012 1240      Component Value Date/Time   CALCIUM 8.4* 01/12/2016 0939   CALCIUM 8.6 06/04/2012 1240   ALKPHOS 157* 01/12/2016 0939   ALKPHOS 79 06/04/2012 1240   AST 23 01/12/2016 0939   AST 11* 06/04/2012 1240   ALT 18 01/12/2016 0939   ALT 16 06/04/2012 1240   BILITOT 0.6 01/12/2016 0939   BILITOT 1.1* 06/04/2012 1240       RADIOGRAPHIC STUDIES: I have personally reviewed  the radiological images as listed and agreed with the findings in the report. No results found.   ASSESSMENT & PLAN:   # CLL- recurrence/anemia with thrombocytopenia- patient is symptomatic and would benefit from therapy. Ibrutinib for started because of insurance reasons.  # I recommend initiation of Rituxan every week x 4; starting today.  # Today patient's white count is 1 50,000; hemoglobin 11 platelets 154.  # Discussed the potential side effects of Rituxan including but not limited to infusion reaction; elevated blood sugars if he gets steroids; and also discussed regarding potential risk of rare opportunity take infections.    Check CBC CMP weekly/follow-up with me in 2 weeks; continue Rituxan infusion weekly.  # 25 minutes face-to-face with the  patient discussing the above plan of care; more than 50% of time spent on prognosis/ natural history; counseling and coordination. The patient's daughter/son at length. They agree.    Cammie Sickle, MD 01/12/2016 10:36 AM

## 2016-01-12 NOTE — ED Provider Notes (Signed)
[facility] Emergency Department Provider Note  ____________________________________________  Time seen: Approximately 2:07 PM  I have reviewed the triage vital signs and the nursing notes.   HISTORY  Chief Complaint Allergic Reaction  History is limited due to patient dementia.  HPI Wesley Mcguire is a 82 y.o. male on chemotherapy for CLL, DM, living in a rehabilitation center presenting with altered mental status. The reason for the patient's visit to the emergency department is unclear, but it is certain that he is having altered mental status. The triage nurse note suggests that the patient is here for blurred vision, which the patient denies and the patient's son who is at bedside also denies. The patient's son is concerned that the patient has not been urinating normally and may have retention. The patient denies this and states that he urinated 5 minutes ago. The patient is not oriented to year or day of the week, and his son states that his baseline is that he is alert and oriented 3. The patient's son states that he takes tramadol for pain but is not on any other opioid medications. The patient is managing a right humerus fracture with conservative immobilization. Otherwise, there is no recent history of nausea, vomiting, trauma, dysuria, abdominal pain, or trauma. No known chest pain, shortness of breath, cough, palpitations, or syncope.   Past Medical History  Diagnosis Date  . Diabetes mellitus without complication ([place])   . Leukemia ([place])   . CLL (chronic lymphocytic leukemia) [facility])     Patient Active Problem List   Diagnosis Date Noted  . CLL (chronic lymphocytic leukemia) ([place]) 12/08/2015  . Protein-calorie malnutrition, severe 11/22/2015  . Hyperkalemia 11/21/2015    History reviewed. No pertinent past surgical history.  Current Outpatient Rx  Name  Route  Sig  Dispense  Refill  . alum & mag hydroxide-simeth (MAALOX/MYLANTA) 200-200-20  MG/5ML suspension   Oral   Take 30 mLs by mouth every 6 (six) hours as needed for indigestion or heartburn (dyspepsia).   355 mL   0   . bisacodyl (DULCOLAX) 5 MG EC tablet   Oral   Take 10 mg by mouth daily as needed for moderate constipation.         . feeding supplement, ENSURE ENLIVE, (ENSURE ENLIVE) LIQD   Oral   Take 237 mLs by mouth 3 (three) times daily with meals.   237 mL   12   . hydrocortisone cream 1 %   Topical   Apply 1 application topically 3 (three) times daily. Apply to posterior shoulder topically every 8 hours as needed for itching         . ibrutinib (IMBRUVICA) 140 MG capsul   Oral   Take 2 capsules (280 mg total) by mouth daily. Administer orally with water at approximately the same time every day   60 capsule   3   . insulin aspart (NOVOLOG) 100 UNIT/ML injection   Subcutaneous   Inject 4 Units into the skin 3 (three) times daily with meals.   10 mL   11   . insulin detemir (LEVEMIR) 100 UNIT/ML injection   Subcutaneous   Inject 0.18 mLs (18 Units total) into the skin at bedtime.   10 mL   11   . nitroGLYCERIN (NITROSTAT) 0.4 MG SL tablet   Sublingual   Place 0.4 mg under the tongue every 5 (five) minutes x 3 doses as needed for chest pain.          Marland Kitchen  traMADol (ULTRAM) 50 MG tablet   Oral   Take 1 tablet (50 mg total) by mouth every 6 (six) hours as needed for moderate pain.   30 tablet   0     Allergies Review of patient's allergies indicates no known allergies.  Family History  Problem Relation Age of Onset  . Cancer Sister     Social History Social History  Substance Use Topics  . Smoking status: Never Smoker   . Smokeless tobacco: None  . Alcohol Use: No    Review of Systems Limited due to patient dementia; the majority of ER is obtained from the patient's son. Constitutional: No fever/chills Eyes: No visual changes. Question blurred vision. ENT: No sore throat. Cardiovascular: Denies chest pain,  palpitations. Respiratory: Denies shortness of breath.  No cough. Gastrointestinal: No abdominal pain.  No nausea, no vomiting.  No diarrhea.  No constipation. Genitourinary: Negative for dysuria. Positive urinary retention. Musculoskeletal: Negative for back pain. Positive right arm pain. Skin: Negative for rash. Neurological: Negative for headaches, focal weakness or numbness.  10-point ROS otherwise negative.  ____________________________________________   PHYSICAL EXAM:  VITAL SIGNS: ED Triage Vitals  Enc Vitals Group     BP 01/12/16 1315 120/73 mmHg     Pulse Rate 01/12/16 1315 101     Resp 01/12/16 1315 18     Temp 01/12/16 1315 97 F (36.1 C)     Temp Source 01/12/16 1315 Oral     SpO2 01/12/16 1315 99 %     Weight 01/12/16 1315 128 lb 1.4 oz (58.1 kg)     Height 01/12/16 1315 6' (1.829 m)     Head Cir --      Peak Flow --      Pain Score --      Pain Loc --      Pain Edu? --      Excl. in [place]? --     Constitutional: The patient is laying comfortably in bed with a GCS of 15. He is alert to person and month, but does not know the year or the day of the week. He does not know why he is here. Patient is chronically ill-appearing and cachectic. Eyes: Conjunctivae are normal.  EOMI. pupils are pinpoint but symmetric and reactive bilaterally. No eye discharge. Head: Atraumatic. No Battle sign or raccoon eyes. Nose: No congestion/rhinnorhea. Mouth/Throat: Mucous membranes are dry. Diffuse poor dentition. Neck: No stridor.  Supple.  No meningismus. Cardiovascular: Normal rate, regular rhythm. No murmurs, rubs or gallops.  Respiratory: Normal respiratory effort.  No retractions. Lungs CTAB.  No wheezes, rales or ronchi. Gastrointestinal: Soft and nontender. No distention. No peritoneal signs. No palpable bladder. Musculoskeletal: No LE edema. No palpable cords, calf pain or Homans sign. Positive tenderness to palpation over the right upper extremity, held in immobilization  for humeral fracture. Neurologic:  Patient has normal speech but is alert and oriented only to person, and month. Moves all extremities well. Face is symmetric. Skin:  Skin is warm, dry and intact. No rash noted. Positive psoriatic changes over the scalp. Psychiatric: Mood and affect are normal.   ____________________________________________   LABS (all labs ordered are listed, but only abnormal results are displayed)  Labs Reviewed  BASIC METABOLIC PANEL - Abnormal; Notable for the following:    Chloride 113 (*)    CO2 20 (*)    Glucose, Bld 259 (*)    BUN 26 (*)    Creatinine, Ser 0.54 (*)    Calcium  6.7 (*)    All other components within normal limits  HEPATIC FUNCTION PANEL - Abnormal; Notable for the following:    Total Protein 4.7 (*)    Albumin 2.4 (*)    AST 59 (*)    All other components within normal limits  TROPONIN I - Abnormal; Notable for the following:    Troponin I 0.05 (*)    All other components within normal limits  CBC - Abnormal; Notable for the following:    WBC 77.3 (*)    RBC 4.15 (*)    Hemoglobin 11.4 (*)    HCT 37.1 (*)    MCHC 30.7 (*)    RDW 15.8 (*)    Platelets 112 (*)    All other components within normal limits  URINALYSIS COMPLETEWITH MICROSCOPIC ([facility] ONLY)   ____________________________________________  EKG  ED ECG REPORT I, Eula Listen, the attending physician, personally viewed and interpreted this ECG.   Date: 01/12/2016  EKG Time: 1826  Rate: 97  Rhythm: normal sinus rhythm  Axis: Leftward  Intervals:none  ST&T Change: Nonspecific T-wave inversion in V1. No ST elevation. No ischemic changes.  ____________________________________________  RADIOLOGY  Dg Chest 2 View  01/12/2016  CLINICAL DATA:  Altered mental status.  Blurred vision. EXAM: CHEST  2 VIEW COMPARISON:  11/21/2015 FINDINGS: Heart is normal size. Mediastinal contours are within normal limits. No confluent airspace opacities or effusions. No acute  bony abnormality. IMPRESSION: No active cardiopulmonary disease. Electronically Signed   By: Rolm Baptise M.D.   On: 01/12/2016 14:57   Ct Head Wo Contrast  01/12/2016  CLINICAL DATA:  Blurred vision during infusion of rituximab for recurrent CLL. EXAM: CT HEAD WITHOUT CONTRAST TECHNIQUE: Contiguous axial images were obtained from the base of the skull through the vertex without intravenous contrast. COMPARISON:  11/21/2015 FINDINGS: Brain: No evidence of acute infarction, hemorrhage, extra-axial collection, ventriculomegaly, or mass effect. 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 There is moderate brain parenchymal atrophy and chronic small vessel disease changes. Vascular: No hyperdense vessel. Vertebral artery calcifications are seen at the skullbase. Skull: Negative for fracture or focal lesion. Sinuses/Orbits: Frothy gas fluid level within the right maxillary sinuses with irregular periosteal thickening of the lateral right maxillary sinus wall, may represent sinusitis exacerbation. There remainder of the sinuses are well aerated. Other: None. IMPRESSION: No acute intracranial abnormality. Atrophy, chronic microvascular disease. 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 Right maxillary sinusitis. Electronically Signed   By: Fidela Salisbury M.D.   On: 01/12/2016 14:07    ____________________________________________   PROCEDURES  Procedure(s) performed: None  Critical Care performed: No ____________________________________________   INITIAL IMPRESSION / ASSESSMENT AND PLAN / ED COURSE  Pertinent labs & imaging results that were available during my care of the patient were reviewed by me and considered in my medical decision making (see chart for details).  82 y.o. male with a history of CLL on chemotherapy presenting with altered mental status. The reason for his visit is not quite clear, but it is clear that the patient is not mentating  at his baseline. I will get basic labs, evaluate for infection, and get a CT of the head to look for any stroke. It is possible that the patient is altered due to his medications either his chemotherapy or his pain medications. Anticipate admission to the hospital.  ----------------------------------------- 5:52 PM on 01/12/2016 -----------------------------------------  The patient has remained hemodynamically stable in the emergency department. His workup shows a CT negative  for any acute intracranial abnormalities. He does have some signs of right maxillary sinusitis, but no tenderness to palpation and no fever. His labs are reassuring except for an elevated troponin which will need to be trended. His EKG does not show ischemic changes at this time, the patient denies any chest pain or shortness of breath. His BUN is elevated which may be a sign of some dehydration although his creatinine remained stable. He has a white blood cell count 77 which is consistent with his CLL and down from his previous counts.  Plan admission to the hospital. ____________________________________________  FINAL CLINICAL IMPRESSION(S) / ED DIAGNOSES  Final diagnoses:  Altered mental status, unspecified altered mental status type  Elevated troponin      NEW MEDICATIONS STARTED DURING THIS VISIT:  New Prescriptions   No medications on file     Eula Listen, MD 01/12/16 1753

## 2016-01-12 NOTE — ED Notes (Signed)
Pt has been changed two times - loose stool present with each change   Stage 3 pressure sore noted to be present under left elbow  Hydrocolloid applied

## 2016-01-12 NOTE — ED Notes (Signed)
Repeat lavender collected and sent to lab

## 2016-01-12 NOTE — Consult Note (Signed)
Patient Demographics  Wesley Mcguire, is a 82 y.o. male   MRN: [id number]   DOB - 04/04/1931  Admit Date - 01/12/2016    Outpatient Primary MD for the patient is Pcp Not In System  Consult requested in the Hospital by Eula Listen, MD, On 01/12/2016    Reason for consult *; altered mental status.  82 year old male patient with history of CLL that his fasting chemotherapy infusion with rituxan. We are asked to admit the patient because of altered mental status. Upon further interviewing the patient patient's son states that patient received IV infusion sitting for 3 hours after that patient got little anxious. And they thought that patient is confused but he is not confused he was little anxious. And they also think he got allergy to the infusion so sent t him to the emergency room. Because he was feeling fuzzy. Head CT is unremarkable. Patient denies any complaints. I spoke with patient's son and he is eager to take the patient home. He is getting 1 L of normal saline. Patient has no chest pain. And he is back to baseline. I feel it safe for him to go home and follow up with Dr. Karen Chafe As needed.  With History of -  Past Medical History  Diagnosis Date  . Diabetes mellitus without complication ([place])   . Leukemia ([place])   . CLL (chronic lymphocytic leukemia) ([place])       History reviewed. No pertinent past surgical history.  in for   Chief Complaint  Patient presents with  . Allergic Reaction      Review of Systems    In addition to the HPI above, No Fever-chills, No Headache, No changes with Vision or hearing, No problems swallowing food or Liquids, No Chest pain, Cough or Shortness of Breath, No Abdominal pain, No Nausea or Vommitting, Bowel movements are regular, No Blood in stool or Urine, No  dysuria, No new skin rashes or bruises, No new joints pains-aches,  No new weakness, tingling, numbness in any extremity, No recent weight gain or loss, No polyuria, polydypsia or polyphagia, No significant Mental Stressors. Patient is cachectic. Uses Glucerna. Cor status is DO NOT RESUSCITATE. A full 10 point Review of Systems was done, except as stated above, all other Review of Systems were negative.   Social History Social History  Substance Use Topics  . Smoking status: Never Smoker   . Smokeless tobacco: Not on file  . Alcohol Use: No     Family History Family History  Problem Relation Age of Onset  . Cancer Sister     Prior to Admission medications   Medication Sig Start Date End Date Taking? Authorizing Provider  aluminum-magnesium hydroxide-simethicone (MAALOX) I037812 MG/5ML SUSP Take 30 mLs by mouth every 6 (six) hours as needed (for indigestion/heartburn).   Yes Historical Provider, MD  bisacodyl (DULCOLAX) 5 MG EC tablet Take 10 mg by mouth daily as needed for moderate constipation.   Yes Historical Provider, MD  feeding supplement, [place], (  GLUCERNA SHAKE) LIQD Take 237 mLs by mouth 4 (four) times daily -  with meals and at bedtime.   Yes Historical Provider, MD  glucagon (GLUCAGEN) 1 MG SOLR injection Inject 1 mg into the vein once as needed for low blood sugar.   Yes Historical Provider, MD  hydrocortisone cream 1 % Apply 1 application topically every 8 (eight) hours as needed for itching. Pt applies to posterior shoulders.   Yes Historical Provider, MD  insulin aspart (NOVOLOG) 100 UNIT/ML injection Inject 0-12 Units into the skin 2 (two) times daily. Pt uses per sliding scale:    0-150:  0 units  151-200:  4 units  201-250:  6 units  251-300:  8 units  301-350:  10 units 351-400:  12 units  Greater than 400:  Call MD   Yes Historical Provider, MD  insulin detemir (LEVEMIR) 100 UNIT/ML injection Inject 15 Units into the skin at bedtime.   Yes  Historical Provider, MD  nitroGLYCERIN (NITROSTAT) 0.4 MG SL tablet Place 0.4 mg under the tongue every 5 (five) minutes x 3 doses as needed for chest pain.    Yes Historical Provider, MD  traMADol (ULTRAM) 50 MG tablet Take 1 tablet (50 mg total) by mouth every 6 (six) hours as needed for moderate pain. 11/25/15  Yes Gladstone Lighter, MD  ibrutinib (IMBRUVICA) 140 MG capsul Take 2 capsules (280 mg total) by mouth daily. Administer orally with water at approximately the same time every day Patient not taking: Reported on 01/12/2016 12/08/15   Cammie Sickle, MD    Anti-infectives    None      Scheduled Meds: Continuous Infusions: PRN Meds:.  No Known Allergies  Physical Exam  Vitals  Blood pressure 108/81, pulse 92, temperature 97 F (36.1 C), temperature source Oral, resp. rate 17, height 6' (1.829 m), weight 58.1 kg (128 lb 1.4 oz), SpO2 93 %.   1. General at, awake, oriented. Cachectic.  2. Normal affect and insight, Not Suicidal or Homicidal, Awake Alert, Oriented X 3.  3. No F.N deficits, ALL C.Nerves Intact, Strength 5/5 all 4 extremities, Sensation intact all 4 extremities, Plantars down going.  4. Ears and Eyes appear Normal, Conjunctivae clear, PERRLA. Moist Oral Mucosa.  5. Supple Neck, No JVD, No cervical lymphadenopathy appriciated, No Carotid Bruits.  6. Symmetrical Chest wall movement, Good air movement bilaterally, CTAB.  7. RRR, No Gallops, Rubs or Murmurs, No Parasternal Heave.  8. Positive Bowel Sounds, Abdomen Soft, No tenderness, No organomegaly appriciated,No rebound -guarding or rigidity.  9.  No Cyanosis, Normal Skin Turgor, No Skin Rash or Bruise.  10. Good muscle tone,  joints appear normal , no effusions, Normal ROM.  11. No Palpable Lymph Nodes in Neck or Axillae    Data Review  CBC  Recent Labs Lab 01/12/16 0939 01/12/16 1502  WBC 150.8* 77.3*  HGB 11.0* 11.4*  HCT 34.3* 37.1*  PLT 153 112*  MCV 87.5 89.3  MCH 27.9 27.4   MCHC 31.9* 30.7*  RDW 15.5* 15.8*  LYMPHSABS 140.3*  --   MONOABS 2.0*  --   EOSABS 1.1*  --   BASOSABS 0.4*  --    ------------------------------------------------------------------------------------------------------------------  Chemistries   Recent Labs Lab 01/12/16 0939 01/12/16 1237 01/12/16 1320  NA 133*  --  141  K 3.8  --  4.6  CL 102  --  113*  CO2 23  --  20*  GLUCOSE 242* 286* 259*  BUN 28*  --  26*  CREATININE 0.57*  --  0.54*  CALCIUM 8.4*  --  6.7*  AST 23  --  59*  ALT 18  --  17  ALKPHOS 157*  --  120  BILITOT 0.6  --  0.6   ------------------------------------------------------------------------------------------------------------------ estimated creatinine clearance is 58.5 mL/min (by C-G formula based on Cr of 0.54). ------------------------------------------------------------------------------------------------------------------ No results for input(s): TSH, T4TOTAL, T3FREE, THYROIDAB in the last 72 hours.  Invalid input(s): FREET3   Coagulation profile No results for input(s): INR, PROTIME in the last 168 hours. ------------------------------------------------------------------------------------------------------------------- No results for input(s): DDIMER in the last 72 hours. -------------------------------------------------------------------------------------------------------------------  Cardiac Enzymes  Recent Labs Lab 01/12/16 1320  TROPONINI 0.05*   ------------------------------------------------------------------------------------------------------------------ Invalid input(s): POCBNP   ---------------------------------------------------------------------------------------------------------------  Urinalysis    Component Value Date/Time   COLORURINE YELLOW* 11/21/2015 1130   APPEARANCEUR CLEAR* 11/21/2015 1130   LABSPEC 1.029 11/21/2015 1130   PHURINE 5.0 11/21/2015 1130   GLUCOSEU >500* 11/21/2015 1130   HGBUR  NEGATIVE 11/21/2015 1130   BILIRUBINUR NEGATIVE 11/21/2015 1130   KETONESUR NEGATIVE 11/21/2015 1130   PROTEINUR 30* 11/21/2015 1130   NITRITE NEGATIVE 11/21/2015 1130   LEUKOCYTESUR NEGATIVE 11/21/2015 1130     Imaging results:   Dg Chest 2 View  01/12/2016  CLINICAL DATA:  Altered mental status.  Blurred vision. EXAM: CHEST  2 VIEW COMPARISON:  11/21/2015 FINDINGS: Heart is normal size. Mediastinal contours are within normal limits. No confluent airspace opacities or effusions. No acute bony abnormality. IMPRESSION: No active cardiopulmonary disease. Electronically Signed   By: Rolm Baptise M.D.   On: 01/12/2016 14:57   Ct Head Wo Contrast  01/12/2016  CLINICAL DATA:  Blurred vision during infusion of rituximab for recurrent CLL. EXAM: CT HEAD WITHOUT CONTRAST TECHNIQUE: Contiguous axial images were obtained from the base of the skull through the vertex without intravenous contrast. COMPARISON:  11/21/2015 FINDINGS: Brain: No evidence of acute infarction, hemorrhage, extra-axial collection, ventriculomegaly, or mass effect. 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 There is moderate brain parenchymal atrophy and chronic small vessel disease changes. Vascular: No hyperdense vessel. Vertebral artery calcifications are seen at the skullbase. Skull: Negative for fracture or focal lesion. Sinuses/Orbits: Frothy gas fluid level within the right maxillary sinuses with irregular periosteal thickening of the lateral right maxillary sinus wall, may represent sinusitis exacerbation. There remainder of the sinuses are well aerated. Other: None. IMPRESSION: No acute intracranial abnormality. Atrophy, chronic microvascular disease. 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 Right maxillary sinusitis. Electronically Signed   By: Fidela Salisbury M.D.   On: 01/12/2016 14:07    Normal sinus rhythm with 97 bpm no ST-T changes.   Assessment & Plan  Active  Problems:   * No active hospital problems. *    1. #1 altered mental status: Likely secondary to chemotherapy infusion that he received for CLL today. Patient out for a little fuzzy so infusion was stopped. He isn't completely alert, awake, oriented. Head CT unremarkable. Patient to be stable for discharge home. He does not need admission. And the family especially the son and the patient are a grade for this plan. #2 slight elevation of troponin which is not significant, no chest pain. Patient is cachectic and he has no chest pain and the troponin leak is not significant enough for doing any further workup at this time. #3. 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 #4 diabetes mellitus: Having episodes of hypoglycemia secondary to malnutrition, poor by  mouth intake. I'll up with primary doctor, use basal insulin. And just. CODE STATUS DO NOT RESUSCITATE. #5 severe protein calorie malnutrition. Patient is medically stable and does not need inpatient admission. And discussed the patient's son and they all feel that patient is back to his normal self oriented and very sharp. and we will discharge the patient after normal saline bolus is finished.     Family Communication: Plan discussed with patient  and patient's son.   Thank you for the consult Time spent on this ER consult is 55 minutes.  Epifanio Lesches M.D on 01/12/2016 at 6:49 PM  Note: This dictation was prepared with Dragon dictation along with smaller phrase technology. Any transcriptional errors that result from this process are unintentional.

## 2016-01-12 NOTE — ED Notes (Signed)
Received a call from the lab - I was told that my specimen was no good/hemolysed -  Blood had been sent a while back - pt is a difficult stick and specimen was good when I sent it

## 2016-01-12 NOTE — ED Notes (Signed)
Pt brought over from the [facility] with c/o from staff  that he was began having blurred vision after starting an infusion of Rituximab for CLL, states that the pt has had this medication in the past for the tx of CLL without reaction, states started back today for recurrent CLL. Pt has dementia and is unable to give information

## 2016-01-12 NOTE — ED Notes (Signed)
In and out cath performed - bladder scanned pot in and out  80ml residual

## 2016-01-13 ENCOUNTER — Encounter: Payer: Self-pay | Admitting: *Deleted

## 2016-01-13 ENCOUNTER — Telehealth: Payer: Self-pay | Admitting: Internal Medicine

## 2016-01-13 ENCOUNTER — Telehealth: Payer: Self-pay | Admitting: *Deleted

## 2016-01-13 NOTE — Telephone Encounter (Signed)
MD spoke with patient's son via telephone at 1330

## 2016-01-13 NOTE — Progress Notes (Unsigned)
01/12/16-Pt's son,Steven, came to clinic at 430pm to speak to MD/RN re: this father's reaction.  Nira Conn, RN and Magda Paganini, RN spoke with patient's son.  RN's explained that patient became diaphorectic and hypotensive during the infusion. Pt had also reported blurred vision and the urge incontinence during the rituxan reaction. Explained that the patient was transported to the Emergency room to stabilize the patient. Explained to patient's son that his mother was personally escorted to ED. I also explained that the infusion RNs attempted to contact Remo Lipps to let him know that the reaction had occurred and that his father was transported to the ED for further evaluation. Annie Main voiced concerns that the Rituxan would be held and that his father would not be treated with the Rituxan anymore due to today's potential reaction. He also voiced concerns that his father was extremely agitated before the infusion. He stated "I feel that my father would tolerate future infusions if he was allowed to be in a bed and lie flat. He is not used to sitting up in a bedside infusion chair. I really feel that this positioning increased his anxiety."

## 2016-01-13 NOTE — Telephone Encounter (Signed)
Called asking to speak with Dr Rogue Bussing regarding infusion reaction yesterday which he feels was not a reaction , but rather attributes it to hypoglycemia. Please return call to him at [phone]

## 2016-01-13 NOTE — Telephone Encounter (Signed)
I spoke to patient's son regarding the infusion reaction that the patient had while getting Rituxan. Given the fact the patient needed to go to the hospital- I would recommend holding off further rituximab infusions. Awaiting on ibrutinib. Patient's son agrees.

## 2016-01-14 ENCOUNTER — Telehealth: Payer: Self-pay | Admitting: *Deleted

## 2016-01-14 NOTE — Telephone Encounter (Signed)
RN contacted Wynetta Emery and [employer] to inquire about status of application for patient assistance for ibrutinib.  Application in review process. Per Johnson/[employer], approval process should take an additional 1 week. Mostly, the application approval should be determined by next Monday 01/19/16.

## 2016-01-19 ENCOUNTER — Inpatient Hospital Stay: Payer: Medicare Other

## 2016-01-20 ENCOUNTER — Telehealth: Payer: Self-pay | Admitting: Internal Medicine

## 2016-01-20 NOTE — Telephone Encounter (Signed)
Patient's family requested they move this appt to Tuesday. While rescheduling, I noticed he missed his lab/rituxan visit on 01/19/16 and advised them of this as I moved the 2/6 appt. To 2/7 per their request. Thanks.

## 2016-01-20 NOTE — Telephone Encounter (Signed)
The patient can not come on a Tuesday to see Dr. Rogue Bussing unless the patient comes to [facility] cancer center. Md is not in [place]. Dr. Rogue Bussing cnl his appointment on 01/19/16 as the patient had an allergic reaction to Rituxan at the last treatment.  Dr. Rogue Bussing has had multiple conversations with the son. At this time, we will not reschedule his rituxan treatments. We are waiting for final approval of ibrutinib, an oral treatment, which is still pending.  The son called and spoke to [facility] yesteday in cancer center scheduling. We informed him at that time, that we are holding off on administering the Rituxan. We asked that the patient keep his appointment for lab/md only at the next lab/md apt in February.

## 2016-01-21 ENCOUNTER — Telehealth: Payer: Self-pay | Admitting: *Deleted

## 2016-01-21 NOTE — Telephone Encounter (Signed)
Son came by cancer center. Pt received letter in mail that he was approved by Kai Levins for his ibrutinib. Letter copied. Explained to patient's son that md does not want pt starting on the drug  until after patient sees md in office next week. Shipment may be received this week. Teach back process performed with patient's son.

## 2016-01-26 ENCOUNTER — Other Ambulatory Visit: Payer: Medicare Other

## 2016-01-26 ENCOUNTER — Ambulatory Visit: Payer: Medicare Other

## 2016-01-26 ENCOUNTER — Ambulatory Visit: Payer: Medicare Other | Admitting: Internal Medicine

## 2016-01-27 ENCOUNTER — Inpatient Hospital Stay: Payer: No Typology Code available for payment source | Attending: Internal Medicine

## 2016-01-27 ENCOUNTER — Inpatient Hospital Stay (HOSPITAL_BASED_OUTPATIENT_CLINIC_OR_DEPARTMENT_OTHER): Payer: No Typology Code available for payment source | Admitting: Internal Medicine

## 2016-01-27 ENCOUNTER — Ambulatory Visit: Payer: Medicare Other

## 2016-01-27 ENCOUNTER — Encounter: Payer: Self-pay | Admitting: Internal Medicine

## 2016-01-27 ENCOUNTER — Other Ambulatory Visit: Payer: Self-pay | Admitting: Internal Medicine

## 2016-01-27 VITALS — BP 98/62 | HR 86 | Temp 97.2°F | Resp 16 | Ht 72.0 in

## 2016-01-27 DIAGNOSIS — D6959 Other secondary thrombocytopenia: Secondary | ICD-10-CM | POA: Insufficient documentation

## 2016-01-27 DIAGNOSIS — C9110 Chronic lymphocytic leukemia of B-cell type not having achieved remission: Secondary | ICD-10-CM

## 2016-01-27 DIAGNOSIS — E1165 Type 2 diabetes mellitus with hyperglycemia: Secondary | ICD-10-CM | POA: Diagnosis not present

## 2016-01-27 DIAGNOSIS — Z888 Allergy status to other drugs, medicaments and biological substances status: Secondary | ICD-10-CM

## 2016-01-27 DIAGNOSIS — Z9221 Personal history of antineoplastic chemotherapy: Secondary | ICD-10-CM

## 2016-01-27 DIAGNOSIS — R634 Abnormal weight loss: Secondary | ICD-10-CM | POA: Diagnosis not present

## 2016-01-27 DIAGNOSIS — R630 Anorexia: Secondary | ICD-10-CM

## 2016-01-27 DIAGNOSIS — Z8781 Personal history of (healed) traumatic fracture: Secondary | ICD-10-CM | POA: Insufficient documentation

## 2016-01-27 DIAGNOSIS — R64 Cachexia: Secondary | ICD-10-CM | POA: Insufficient documentation

## 2016-01-27 DIAGNOSIS — Z794 Long term (current) use of insulin: Secondary | ICD-10-CM | POA: Insufficient documentation

## 2016-01-27 DIAGNOSIS — Z9181 History of falling: Secondary | ICD-10-CM | POA: Insufficient documentation

## 2016-01-27 DIAGNOSIS — Z79899 Other long term (current) drug therapy: Secondary | ICD-10-CM | POA: Insufficient documentation

## 2016-01-27 DIAGNOSIS — D638 Anemia in other chronic diseases classified elsewhere: Secondary | ICD-10-CM | POA: Insufficient documentation

## 2016-01-27 LAB — COMPREHENSIVE METABOLIC PANEL
ALT: 17 U/L (ref 17–63)
ANION GAP: 5 (ref 5–15)
AST: 23 U/L (ref 15–41)
Albumin: 3.4 g/dL — ABNORMAL LOW (ref 3.5–5.0)
Alkaline Phosphatase: 139 U/L — ABNORMAL HIGH (ref 38–126)
BILIRUBIN TOTAL: 0.7 mg/dL (ref 0.3–1.2)
BUN: 22 mg/dL — AB (ref 6–20)
CHLORIDE: 100 mmol/L — AB (ref 101–111)
CO2: 31 mmol/L (ref 22–32)
Calcium: 8.9 mg/dL (ref 8.9–10.3)
Creatinine, Ser: 0.44 mg/dL — ABNORMAL LOW (ref 0.61–1.24)
Glucose, Bld: 89 mg/dL (ref 65–99)
POTASSIUM: 4.3 mmol/L (ref 3.5–5.1)
Sodium: 136 mmol/L (ref 135–145)
TOTAL PROTEIN: 6.4 g/dL — AB (ref 6.5–8.1)

## 2016-01-27 LAB — CBC WITH DIFFERENTIAL/PLATELET
BASOS ABS: 0.3 10*3/uL — AB (ref 0–0.1)
Basophils Relative: 0 %
Eosinophils Absolute: 1.5 10*3/uL — ABNORMAL HIGH (ref 0–0.7)
HCT: 35.8 % — ABNORMAL LOW (ref 40.0–52.0)
HEMOGLOBIN: 11 g/dL — AB (ref 13.0–18.0)
LYMPHS ABS: 110.4 10*3/uL — AB (ref 1.0–3.6)
MCH: 27.6 pg (ref 26.0–34.0)
MCHC: 30.7 g/dL — ABNORMAL LOW (ref 32.0–36.0)
MCV: 89.8 fL (ref 80.0–100.0)
Monocytes Absolute: 1.2 10*3/uL — ABNORMAL HIGH (ref 0.2–1.0)
Monocytes Relative: 1 %
Neutro Abs: 4.8 10*3/uL (ref 1.4–6.5)
PLATELETS: 106 10*3/uL — AB (ref 150–440)
RBC: 3.99 MIL/uL — AB (ref 4.40–5.90)
RDW: 15.7 % — ABNORMAL HIGH (ref 11.5–14.5)
WBC: 118.2 10*3/uL — AB (ref 3.8–10.6)

## 2016-01-27 NOTE — Progress Notes (Signed)
Pt signed consent for ibrutinib. Orders sent to [facility] with Patient to start patient on the ibrutinib 140 mg capsule daily. [facility] had drug delivered as scheduled last week. Patient can start on the ibrutinib today.

## 2016-01-27 NOTE — Progress Notes (Signed)
[facility] OFFICE PROGRESS NOTE  Patient Care Team: Pcp Not In System as PCP - General   SUMMARY OF ONCOLOGIC HISTORY:  # [name] s/p Rituxan; NOV 2016- Recurrence; DEC 2016-CT A/P-- Bulky confluent retroperitoneal, mesenteric and bilateral pelvic lymphadenopathy, mildly increased since 2012]. 2017 JAN- Infusion reaction to Rituxan  # Poorly controlled DM/ cachexia/Left humerus fracture s/p fall [dec 2016]; pt in hospice [2016]  INTERVAL HISTORY: Poor/vague historian-  Accompanied by his son.  82 year old male patient frail/multiple comorbidities with above history of recurrent CLL is here today to review his treatment options.  Patient had infusion reaction with first infusion of rituximab 2 weeks ago. Patient experienced drop in blood pressure; complained of nausea; also complained of urinary retention/extreme drowsiness [question secondary to Benadryl]- needing to send the patient to the emergency room. Patient was discharged to the rehabilitation the same day.   as per the son patient is a poor appetite;  He continues to lose weight. ?  20 pounds weight loss since last visit [ as per nursing home he weight 97 pounds].   Otherwise no fever no chills.  However he is still unsteady on his feet. No nausea no vomiting.He is still in his left upper extremity sling. No plans for any surgery of his left humerus fracture.   REVIEW OF SYSTEMS:  A complete 10 point review of system is done which is negative except mentioned above/history of present illness.   PAST MEDICAL HISTORY :  Past Medical History  Diagnosis Date  . Diabetes mellitus without complication ([place])   . Leukemia ([place])   . CLL (chronic lymphocytic leukemia) ([place])     PAST SURGICAL HISTORY :  History reviewed. No pertinent past surgical history.  FAMILY HISTORY :   Family History  Problem Relation Age of Onset  . Cancer Sister     SOCIAL HISTORY:   Social History  Substance Use Topics  . Smoking  status: Never Smoker   . Smokeless tobacco: Never Used  . Alcohol Use: No    ALLERGIES:  has No Known Allergies.  MEDICATIONS:  Current Outpatient Prescriptions  Medication Sig Dispense Refill  . aluminum-magnesium hydroxide-simethicone (MAALOX) [id number] MG/5ML SUSP Take 30 mLs by mouth every 6 (six) hours as needed (for indigestion/heartburn).    . bisacodyl (DULCOLAX) 5 MG EC tablet Take 10 mg by mouth daily as needed for moderate constipation.    . feeding supplement, GLUCERNA SHAKE, (GLUCERNA SHAKE) LIQD Take 237 mLs by mouth 4 (four) times daily -  with meals and at bedtime.    Marland Kitchen glucagon (GLUCAGEN) 1 MG SOLR injection Inject 1 mg into the vein once as needed for low blood sugar.    . hydrocortisone cream 1 % Apply 1 application topically every 8 (eight) hours as needed for itching. Pt applies to posterior shoulders.    . ibrutinib (IMBRUVICA) 140 MG capsul Take 2 capsules (280 mg total) by mouth daily. Administer orally with water at approximately the same time every day 60 capsule 3  . insulin aspart (NOVOLOG) 100 UNIT/ML injection Inject 0-12 Units into the skin 2 (two) times daily. Pt uses per sliding scale:    0-150:  0 units  151-200:  4 units  201-250:  6 units  251-300:  8 units  301-350:  10 units 351-400:  12 units  Greater than 400:  Call MD    . nitroGLYCERIN (NITROSTAT) 0.4 MG SL tablet Place 0.4 mg under the tongue every 5 (five) minutes x 3  doses as needed for chest pain.     . traMADol (ULTRAM) 50 MG tablet Take 1 tablet (50 mg total) by mouth every 6 (six) hours as needed for moderate pain. 30 tablet 0   No current facility-administered medications for this visit.    PHYSICAL EXAMINATION: ECOG PERFORMANCE STATUS: 3 - Symptomatic, >50% confined to bed  BP 98/62 mmHg  Pulse 86  Temp(Src) 97.2 F (36.2 C) (Tympanic)  Resp 16  Ht 6' (1.829 m)  Wt   GENERAL: Cachectic appearing Caucasian male patient Alert, no distress and comfortable.   He is in a  wheelchair. Accompanied by his son.  EYES: no pallor or icterus;  OROPHARYNX: no thrush or ulceration;poor dentition. Marland Kitchen NECK: supple, no masses felt LYMPH: positive for palpable lymphadenopathy in the  bil axillary; inguinal regions cannot be examined LUNGS: clear to auscultation and  No wheeze or crackles HEART/CVS: regular rate & rhythm and no murmurs; No lower extremity edema ABDOMEN:abdomen soft, non-tender and normal bowel sounds Musculoskeletal:no cyanosis of digits and no clubbing; pt's left UE in sling [from recent humeral fracture]  PSYCH: alert & oriented x 3  NEURO: no focal motor/sensory deficits SKIN:  no rashes or significant lesions   LABORATORY DATA:  I have reviewed the data as listed    Component Value Date/Time   NA 136 01/27/2016 0801   NA 143 06/04/2012 1240   K 4.3 01/27/2016 0801   K 4.2 06/04/2012 1240   CL 100* 01/27/2016 0801   CL 106 06/04/2012 1240   CO2 31 01/27/2016 0801   CO2 29 06/04/2012 1240   GLUCOSE 89 01/27/2016 0801   GLUCOSE 208* 06/04/2012 1240   BUN 22* 01/27/2016 0801   BUN 26* 06/04/2012 1240   CREATININE 0.44* 01/27/2016 0801   CREATININE 0.82 06/04/2012 1240   CALCIUM 8.9 01/27/2016 0801   CALCIUM 8.6 06/04/2012 1240   PROT 6.4* 01/27/2016 0801   PROT 6.0* 06/04/2012 1240   ALBUMIN 3.4* 01/27/2016 0801   ALBUMIN 3.4 06/04/2012 1240   AST 23 01/27/2016 0801   AST 11* 06/04/2012 1240   ALT 17 01/27/2016 0801   ALT 16 06/04/2012 1240   ALKPHOS 139* 01/27/2016 0801   ALKPHOS 79 06/04/2012 1240   BILITOT 0.7 01/27/2016 0801   BILITOT 1.1* 06/04/2012 1240   GFRNONAA >60 01/27/2016 0801   GFRNONAA >60 06/04/2012 1240   GFRNONAA >60 02/14/2012 0854   GFRAA >60 01/27/2016 0801   GFRAA >60 06/04/2012 1240   GFRAA >60 02/14/2012 0854    No results found for: SPEP, UPEP  Lab Results  Component Value Date   WBC 118.2* 01/27/2016   NEUTROABS 4.8 01/27/2016   HGB 11.0* 01/27/2016   HCT 35.8* 01/27/2016   MCV 89.8 01/27/2016    PLT 106* 01/27/2016      Chemistry      Component Value Date/Time   NA 136 01/27/2016 0801   NA 143 06/04/2012 1240   K 4.3 01/27/2016 0801   K 4.2 06/04/2012 1240   CL 100* 01/27/2016 0801   CL 106 06/04/2012 1240   CO2 31 01/27/2016 0801   CO2 29 06/04/2012 1240   BUN 22* 01/27/2016 0801   BUN 26* 06/04/2012 1240   CREATININE 0.44* 01/27/2016 0801   CREATININE 0.82 06/04/2012 1240      Component Value Date/Time   CALCIUM 8.9 01/27/2016 0801   CALCIUM 8.6 06/04/2012 1240   ALKPHOS 139* 01/27/2016 0801   ALKPHOS 79 06/04/2012 1240   AST 23 01/27/2016  0801   AST 11* 06/04/2012 1240   ALT 17 01/27/2016 0801   ALT 16 06/04/2012 1240   BILITOT 0.7 01/27/2016 0801   BILITOT 1.1* 06/04/2012 1240        ASSESSMENT & PLAN:   # CLL- recurrence/anemia with thrombocytopenia- patient is symptomatic [singnificant weight loss] and would benefit from therapy. Finally,  ibrutinib  Has been approved.  I would recommend 140 mg/ once a day [ 1pill a day].  Based on patient's tolerance recommend  Going up.  A note was written to the nursing home.   I also discussed the potential side effects including but not limited to-  Transient elevation of the patient's white count;  Concern for A. Fib and also diarrhea.  #  rituximab infusion reaction-  Severe  Needing to go to the hospital.  Hold off any further Rituxan infusions.  #  Reevaluate the patient in 2 weeks;  CBC CMP.   # 25 minutes face-to-face with the patient discussing the above plan of care; more than 50% of time spent on prognosis/ natural history; counseling and coordination. The patient's daughter/son at length. They agree.    Cammie Sickle, MD 01/27/2016 9:05 AM

## 2016-02-10 ENCOUNTER — Inpatient Hospital Stay (HOSPITAL_BASED_OUTPATIENT_CLINIC_OR_DEPARTMENT_OTHER): Payer: No Typology Code available for payment source | Admitting: Internal Medicine

## 2016-02-10 ENCOUNTER — Other Ambulatory Visit: Payer: Self-pay | Admitting: *Deleted

## 2016-02-10 ENCOUNTER — Inpatient Hospital Stay: Payer: No Typology Code available for payment source

## 2016-02-10 VITALS — BP 101/60 | HR 90 | Temp 96.4°F | Resp 20

## 2016-02-10 DIAGNOSIS — Z794 Long term (current) use of insulin: Secondary | ICD-10-CM

## 2016-02-10 DIAGNOSIS — D6959 Other secondary thrombocytopenia: Secondary | ICD-10-CM | POA: Diagnosis not present

## 2016-02-10 DIAGNOSIS — Z888 Allergy status to other drugs, medicaments and biological substances status: Secondary | ICD-10-CM

## 2016-02-10 DIAGNOSIS — Z9221 Personal history of antineoplastic chemotherapy: Secondary | ICD-10-CM | POA: Diagnosis not present

## 2016-02-10 DIAGNOSIS — R64 Cachexia: Secondary | ICD-10-CM

## 2016-02-10 DIAGNOSIS — D638 Anemia in other chronic diseases classified elsewhere: Secondary | ICD-10-CM

## 2016-02-10 DIAGNOSIS — C9110 Chronic lymphocytic leukemia of B-cell type not having achieved remission: Secondary | ICD-10-CM | POA: Diagnosis not present

## 2016-02-10 DIAGNOSIS — Z79899 Other long term (current) drug therapy: Secondary | ICD-10-CM

## 2016-02-10 DIAGNOSIS — Z9181 History of falling: Secondary | ICD-10-CM

## 2016-02-10 DIAGNOSIS — Z8781 Personal history of (healed) traumatic fracture: Secondary | ICD-10-CM

## 2016-02-10 DIAGNOSIS — E1165 Type 2 diabetes mellitus with hyperglycemia: Secondary | ICD-10-CM

## 2016-02-10 LAB — COMPREHENSIVE METABOLIC PANEL
ALK PHOS: 126 U/L (ref 38–126)
ALT: 16 U/L — ABNORMAL LOW (ref 17–63)
AST: 21 U/L (ref 15–41)
Albumin: 3.8 g/dL (ref 3.5–5.0)
Anion gap: 4 — ABNORMAL LOW (ref 5–15)
BUN: 27 mg/dL — AB (ref 6–20)
CALCIUM: 8.6 mg/dL — AB (ref 8.9–10.3)
CO2: 29 mmol/L (ref 22–32)
Chloride: 99 mmol/L — ABNORMAL LOW (ref 101–111)
Creatinine, Ser: 0.48 mg/dL — ABNORMAL LOW (ref 0.61–1.24)
GFR calc Af Amer: >60 mL/min (ref >60)
GFR calc non Af Amer: >60 mL/min (ref >60)
GLUCOSE: 178 mg/dL — AB (ref 65–99)
Potassium: 4.1 mmol/L (ref 3.5–5.1)
SODIUM: 132 mmol/L — AB (ref 135–145)
TOTAL PROTEIN: 6.7 g/dL (ref 6.5–8.1)
Total Bilirubin: 0.6 mg/dL (ref 0.3–1.2)

## 2016-02-10 LAB — CBC WITH DIFFERENTIAL/PLATELET
Basophils Absolute: 0.4 10*3/uL — ABNORMAL HIGH (ref 0–0.1)
EOS ABS: 0.4 10*3/uL (ref 0–0.7)
HCT: 33.8 % — ABNORMAL LOW (ref 40.0–52.0)
HEMOGLOBIN: 10.9 g/dL — AB (ref 13.0–18.0)
LYMPHS ABS: 153.7 10*3/uL — AB (ref 1.0–3.6)
Lymphocytes Relative: 93 %
MCH: 27.8 pg (ref 26.0–34.0)
MCHC: 32.2 g/dL (ref 32.0–36.0)
MCV: 86.4 fL (ref 80.0–100.0)
MONO ABS: 1.8 10*3/uL — AB (ref 0.2–1.0)
Neutro Abs: 9.3 10*3/uL — ABNORMAL HIGH (ref 1.4–6.5)
Platelets: 82 10*3/uL — ABNORMAL LOW (ref 150–440)
RBC: 3.91 MIL/uL — ABNORMAL LOW (ref 4.40–5.90)
RDW: 15.2 % — AB (ref 11.5–14.5)
WBC: 165.6 10*3/uL (ref 3.8–10.6)

## 2016-02-10 NOTE — Progress Notes (Signed)
Started on Imbruvica; Tolerating. Son reports patient is eating good. Seems more agile. Pt spends most of day in bed as he is weak and a high falls risk . He resides at [facility]. Complains of tenderness in L elbow he has a wound/ulcer. BMs daily. Incontinent of B/B.

## 2016-02-10 NOTE — Progress Notes (Signed)
[facility] OFFICE PROGRESS NOTE  Patient Care Team: Pcp Not In System as PCP - General   SUMMARY OF ONCOLOGIC HISTORY:  # [name] s/p Rituxan; NOV 2016- Recurrence; DEC 2016-CT A/P-- Bulky confluent retroperitoneal, mesenteric and bilateral pelvic lymphadenopathy, mildly increased since 2012]. 2017 JAN- Infusion reaction to Rituxan [stopped];   # FEB 2017- IBRUTINIB 1 pill a day  # Poorly controlled DM/ cachexia/Left humerus fracture s/p fall [dec 2016]; pt in hospice [2016]  INTERVAL HISTORY: Poor/vague historian-  Accompanied by his son.  82 year old male patient frail/multiple comorbidities with above history of recurrent CLL- currently rehabilitation is on Ibrutinib 1 pill a day for the last 2 weeks.  Patient stated his appetite is improving. No significant weight gain noted. As per the son patient is more "active".   Patient denies diarrhea denies any lightheadedness or syncopal episodes.    Otherwise no fever no chills.  However he is still unsteady on his feet. No nausea no vomiting.He is still in his left upper extremity sling.   REVIEW OF SYSTEMS:  A complete 10 point review of system is done which is negative except mentioned above/history of present illness.   PAST MEDICAL HISTORY :  Past Medical History  Diagnosis Date  . Diabetes mellitus without complication ([place])   . Leukemia ([place])   . CLL (chronic lymphocytic leukemia) ([place])     PAST SURGICAL HISTORY :  No past surgical history on file.  FAMILY HISTORY :   Family History  Problem Relation Age of Onset  . Cancer Sister     SOCIAL HISTORY:   Social History  Substance Use Topics  . Smoking status: Never Smoker   . Smokeless tobacco: Never Used  . Alcohol Use: No    ALLERGIES:  has No Known Allergies.  MEDICATIONS:  Current Outpatient Prescriptions  Medication Sig Dispense Refill  . aluminum-magnesium hydroxide-simethicone (MAALOX) [id number] MG/5ML SUSP Take 30 mLs by mouth every  6 (six) hours as needed (for indigestion/heartburn).    . bisacodyl (DULCOLAX) 5 MG EC tablet Take 10 mg by mouth daily as needed for moderate constipation.    . feeding supplement, GLUCERNA SHAKE, (GLUCERNA SHAKE) LIQD Take 237 mLs by mouth 4 (four) times daily -  with meals and at bedtime.    Marland Kitchen glucagon (GLUCAGEN) 1 MG SOLR injection Inject 1 mg into the vein once as needed for low blood sugar.    . hydrocortisone cream 1 % Apply 1 application topically every 8 (eight) hours as needed for itching. Pt applies to posterior shoulders.    . ibrutinib (IMBRUVICA) 140 MG capsul Take 2 capsules (280 mg total) by mouth daily. Administer orally with water at approximately the same time every day 60 capsule 3  . insulin aspart (NOVOLOG) 100 UNIT/ML injection Inject 0-12 Units into the skin 2 (two) times daily. Pt uses per sliding scale:    0-150:  0 units  151-200:  4 units  201-250:  6 units  251-300:  8 units  301-350:  10 units 351-400:  12 units  Greater than 400:  Call MD    . nitroGLYCERIN (NITROSTAT) 0.4 MG SL tablet Place 0.4 mg under the tongue every 5 (five) minutes x 3 doses as needed for chest pain.     . traMADol (ULTRAM) 50 MG tablet Take 1 tablet (50 mg total) by mouth every 6 (six) hours as needed for moderate pain. 30 tablet 0   No current facility-administered medications for this visit.  PHYSICAL EXAMINATION: ECOG PERFORMANCE STATUS: 3 - Symptomatic, >50% confined to bed  BP 101/60 mmHg  Pulse 90  Temp(Src) 96.4 F (35.8 C)  Resp 20  SpO2 95%  GENERAL: Cachectic appearing Caucasian male patient Alert, no distress and comfortable.   He is in a wheelchair. Accompanied by his son.  EYES: no pallor or icterus;  OROPHARYNX: no thrush or ulceration;poor dentition. Marland Kitchen NECK: supple, no masses felt LYMPH: positive for palpable lymphadenopathy in the  bil axillary; inguinal regions cannot be examined LUNGS: clear to auscultation and  No wheeze or crackles HEART/CVS: regular  rate & rhythm and no murmurs; No lower extremity edema ABDOMEN:abdomen soft, non-tender and normal bowel sounds Musculoskeletal:no cyanosis of digits and no clubbing; pt's left UE in sling [from recent humeral fracture]  PSYCH: alert & oriented x 3  NEURO: no focal motor/sensory deficits SKIN:  no rashes or significant lesions   LABORATORY DATA:  I have reviewed the data as listed    Component Value Date/Time   NA 132* 02/10/2016 0842   NA 143 06/04/2012 1240   K 4.1 02/10/2016 0842   K 4.2 06/04/2012 1240   CL 99* 02/10/2016 0842   CL 106 06/04/2012 1240   CO2 29 02/10/2016 0842   CO2 29 06/04/2012 1240   GLUCOSE 178* 02/10/2016 0842   GLUCOSE 208* 06/04/2012 1240   BUN 27* 02/10/2016 0842   BUN 26* 06/04/2012 1240   CREATININE 0.48* 02/10/2016 0842   CREATININE 0.82 06/04/2012 1240   CALCIUM 8.6* 02/10/2016 0842   CALCIUM 8.6 06/04/2012 1240   PROT 6.7 02/10/2016 0842   PROT 6.0* 06/04/2012 1240   ALBUMIN 3.8 02/10/2016 0842   ALBUMIN 3.4 06/04/2012 1240   AST 21 02/10/2016 0842   AST 11* 06/04/2012 1240   ALT 16* 02/10/2016 0842   ALT 16 06/04/2012 1240   ALKPHOS 126 02/10/2016 0842   ALKPHOS 79 06/04/2012 1240   BILITOT 0.6 02/10/2016 0842   BILITOT 1.1* 06/04/2012 1240   GFRNONAA >60 02/10/2016 0842   GFRNONAA >60 06/04/2012 1240   GFRNONAA >60 02/14/2012 0854   GFRAA >60 02/10/2016 0842   GFRAA >60 06/04/2012 1240   GFRAA >60 02/14/2012 0854    No results found for: SPEP, UPEP  Lab Results  Component Value Date   WBC 165.6* 02/10/2016   NEUTROABS 9.3* 02/10/2016   HGB 10.9* 02/10/2016   HCT 33.8* 02/10/2016   MCV 86.4 02/10/2016   PLT 82* 02/10/2016      Chemistry      Component Value Date/Time   NA 132* 02/10/2016 0842   NA 143 06/04/2012 1240   K 4.1 02/10/2016 0842   K 4.2 06/04/2012 1240   CL 99* 02/10/2016 0842   CL 106 06/04/2012 1240   CO2 29 02/10/2016 0842   CO2 29 06/04/2012 1240   BUN 27* 02/10/2016 0842   BUN 26* 06/04/2012  1240   CREATININE 0.48* 02/10/2016 0842   CREATININE 0.82 06/04/2012 1240      Component Value Date/Time   CALCIUM 8.6* 02/10/2016 0842   CALCIUM 8.6 06/04/2012 1240   ALKPHOS 126 02/10/2016 0842   ALKPHOS 79 06/04/2012 1240   AST 21 02/10/2016 0842   AST 11* 06/04/2012 1240   ALT 16* 02/10/2016 0842   ALT 16 06/04/2012 1240   BILITOT 0.6 02/10/2016 0842   BILITOT 1.1* 06/04/2012 1240        ASSESSMENT & PLAN:   # CLL- recurrence/anemia with thrombocytopenia- patient is symptomatic [singnificant weight loss]-  currently on ibrutinib 1 pill a day. Patient seems to be tolerating the pill daily well. As per the son- some clinical improvement noted [improved appetite].  # Today patient's white count is elevated at 165/ 118- which is an expected event while patients are on ibrutinib. Hemoglobin is stable around 10.9; platelets are slightly low at 82.  # Continue current therapy- follow-up with me in approximately 4 weeks with labs.  # 15 minutes face-to-face with the patient discussing the above plan of care; more than 50% of time spent on natural history; counseling and coordination.    Cammie Sickle, MD 02/10/2016 9:10 AM

## 2016-02-14 LAB — FISH HES LEUKEMIA, 4Q12 REA

## 2016-02-26 ENCOUNTER — Telehealth: Payer: Self-pay | Admitting: *Deleted

## 2016-02-26 NOTE — Telephone Encounter (Signed)
Per Dr B should hold off on teeth extractions due to increased risk of bleeding with low platelets.I spoke with son [facility] and [facility] Mc[facility] him of this and he was appreciative for thereturn call

## 2016-02-26 NOTE — Telephone Encounter (Signed)
Asking what Dr Rogue Bussing thinks about patient having his teeth pulled

## 2016-03-01 ENCOUNTER — Telehealth: Payer: Self-pay | Admitting: *Deleted

## 2016-03-01 NOTE — Telephone Encounter (Signed)
-----   Message from [place] sent at 03/01/2016  4:04 PM EDT ----- Patients daughter Judeen Hammans called and wanted to know if Dr B thought it was a good idea for her father to have his teeth removed. Please call  [phone]

## 2016-03-01 NOTE — Telephone Encounter (Signed)
I called pt's daughter back and explained that the Md states that he will discuss this with the patient/patient's caregiver at next week's appointment.

## 2016-03-08 ENCOUNTER — Other Ambulatory Visit: Payer: Self-pay | Admitting: *Deleted

## 2016-03-08 DIAGNOSIS — C9110 Chronic lymphocytic leukemia of B-cell type not having achieved remission: Secondary | ICD-10-CM

## 2016-03-09 ENCOUNTER — Encounter: Payer: Self-pay | Admitting: Internal Medicine

## 2016-03-09 ENCOUNTER — Ambulatory Visit (HOSPITAL_BASED_OUTPATIENT_CLINIC_OR_DEPARTMENT_OTHER): Payer: Medicare Other | Admitting: Internal Medicine

## 2016-03-09 ENCOUNTER — Inpatient Hospital Stay: Payer: Medicare Other | Attending: Internal Medicine

## 2016-03-09 VITALS — BP 106/65 | HR 82 | Temp 98.5°F | Resp 18 | Ht 72.0 in

## 2016-03-09 DIAGNOSIS — E1165 Type 2 diabetes mellitus with hyperglycemia: Secondary | ICD-10-CM

## 2016-03-09 DIAGNOSIS — D649 Anemia, unspecified: Secondary | ICD-10-CM

## 2016-03-09 DIAGNOSIS — D696 Thrombocytopenia, unspecified: Secondary | ICD-10-CM

## 2016-03-09 DIAGNOSIS — Z8781 Personal history of (healed) traumatic fracture: Secondary | ICD-10-CM | POA: Diagnosis not present

## 2016-03-09 DIAGNOSIS — Z79899 Other long term (current) drug therapy: Secondary | ICD-10-CM | POA: Insufficient documentation

## 2016-03-09 DIAGNOSIS — Z794 Long term (current) use of insulin: Secondary | ICD-10-CM

## 2016-03-09 DIAGNOSIS — C9110 Chronic lymphocytic leukemia of B-cell type not having achieved remission: Secondary | ICD-10-CM | POA: Insufficient documentation

## 2016-03-09 DIAGNOSIS — Z9221 Personal history of antineoplastic chemotherapy: Secondary | ICD-10-CM | POA: Insufficient documentation

## 2016-03-09 DIAGNOSIS — Z9181 History of falling: Secondary | ICD-10-CM | POA: Insufficient documentation

## 2016-03-09 LAB — COMPREHENSIVE METABOLIC PANEL
ALT: 14 U/L — AB (ref 17–63)
ANION GAP: 5 (ref 5–15)
AST: 20 U/L (ref 15–41)
Albumin: 3.6 g/dL (ref 3.5–5.0)
Alkaline Phosphatase: 104 U/L (ref 38–126)
BUN: 29 mg/dL — ABNORMAL HIGH (ref 6–20)
CHLORIDE: 97 mmol/L — AB (ref 101–111)
CO2: 28 mmol/L (ref 22–32)
Calcium: 8.4 mg/dL — ABNORMAL LOW (ref 8.9–10.3)
Creatinine, Ser: 0.45 mg/dL — ABNORMAL LOW (ref 0.61–1.24)
GFR calc non Af Amer: >60 mL/min (ref >60)
GLUCOSE: 359 mg/dL — AB (ref 65–99)
Potassium: 4.1 mmol/L (ref 3.5–5.1)
SODIUM: 130 mmol/L — AB (ref 135–145)
Total Bilirubin: 0.6 mg/dL (ref 0.3–1.2)
Total Protein: 5.9 g/dL — ABNORMAL LOW (ref 6.5–8.1)

## 2016-03-09 LAB — CBC WITH DIFFERENTIAL/PLATELET
BASOS ABS: 0.1 10*3/uL (ref 0–0.1)
Basophils Relative: 0 %
Eosinophils Absolute: 0.3 10*3/uL (ref 0–0.7)
HCT: 36.8 % — ABNORMAL LOW (ref 40.0–52.0)
HEMOGLOBIN: 11.6 g/dL — AB (ref 13.0–18.0)
Lymphs Abs: 132.7 10*3/uL — ABNORMAL HIGH (ref 1.0–3.6)
MCH: 27.2 pg (ref 26.0–34.0)
MCHC: 31.5 g/dL — ABNORMAL LOW (ref 32.0–36.0)
MCV: 86.3 fL (ref 80.0–100.0)
Monocytes Absolute: 3.2 10*3/uL — ABNORMAL HIGH (ref 0.2–1.0)
Monocytes Relative: 2 %
Neutro Abs: 4.4 10*3/uL (ref 1.4–6.5)
PLATELETS: 58 10*3/uL — AB (ref 150–440)
RBC: 4.26 MIL/uL — AB (ref 4.40–5.90)
RDW: 15 % — ABNORMAL HIGH (ref 11.5–14.5)
WBC: 140.7 10*3/uL — AB (ref 3.8–10.6)

## 2016-03-09 LAB — FISH HES LEUKEMIA, 4Q12 REA

## 2016-03-09 NOTE — Progress Notes (Signed)
[facility] OFFICE PROGRESS NOTE  Patient Care Team: Pcp Not In System as PCP - General   SUMMARY OF ONCOLOGIC HISTORY:  # [name] s/p Rituxan; NOV 2016- Recurrence; DEC 2016-CT A/P-- Bulky confluent retroperitoneal, mesenteric and bilateral pelvic lymphadenopathy, mildly increased since 2012]. 2017 JAN- Infusion reaction to Rituxan [stopped];   # FEB 2017- IBRUTINIB 1 pill a day; FISH- POSITIVE FOR HOMOZYGOUS 13Q DELETION;  POSITIVE FOR 17P GENE DELETION  # Poorly controlled DM/ cachexia/Left humerus fracture s/p fall [dec 2016]; pt in hospice [2016]  INTERVAL HISTORY: Poor/vague historian-  Accompanied by his son.  82 year old male patient frail/multiple comorbidities with above history of recurrent CLL- currently rehabilitation is on Ibrutinib 1 pill a day for one month now.   As per the son patient's appetite is improving; his more up and about in the nursing home. Patient denies diarrhea denies any lightheadedness or syncopal episodes.    Otherwise no fever no chills.  However he is still unsteady on his feet. No nausea no vomiting.   REVIEW OF SYSTEMS:  A complete 10 point review of system is done which is negative except mentioned above/history of present illness.   PAST MEDICAL HISTORY :  Past Medical History  Diagnosis Date  . Diabetes mellitus without complication ([place])   . Leukemia ([place])   . CLL (chronic lymphocytic leukemia) ([place])   . Anemia   . Neuromuscular disorder (HCC)     muscle weakness  . Humerus fracture     PAST SURGICAL HISTORY :  History reviewed. No pertinent past surgical history.  FAMILY HISTORY :   Family History  Problem Relation Age of Onset  . Cancer Sister     SOCIAL HISTORY:   Social History  Substance Use Topics  . Smoking status: Never Smoker   . Smokeless tobacco: Never Used  . Alcohol Use: No    ALLERGIES:  has No Known Allergies.  MEDICATIONS:  Current Outpatient Prescriptions  Medication Sig Dispense  Refill  . aluminum-magnesium hydroxide-simethicone (MAALOX) [id number] MG/5ML SUSP Take 30 mLs by mouth every 6 (six) hours as needed (for indigestion/heartburn).    . bisacodyl (DULCOLAX) 5 MG EC tablet Take 10 mg by mouth daily as needed for moderate constipation.    . feeding supplement, GLUCERNA SHAKE, (GLUCERNA SHAKE) LIQD Take 237 mLs by mouth 4 (four) times daily -  with meals and at bedtime.    Marland Kitchen glucagon (GLUCAGEN) 1 MG SOLR injection Inject 1 mg into the vein once as needed for low blood sugar.    . hydrocortisone cream 1 % Apply 1 application topically every 8 (eight) hours as needed for itching. Pt applies to posterior shoulders.    . ibrutinib (IMBRUVICA) 140 MG capsul Take 2 capsules (280 mg total) by mouth daily. Administer orally with water at approximately the same time every day (Patient taking differently: Take 140 mg by mouth daily. Administer orally with water at approximately the same time every day) 60 capsule 3  . insulin aspart (NOVOLOG) 100 UNIT/ML injection Inject 0-12 Units into the skin 2 (two) times daily. Pt uses per sliding scale:    0-150:  0 units  151-200:  4 units  201-250:  6 units  251-300:  8 units  301-350:  10 units 351-400:  12 units  Greater than 400:  Call MD    . insulin detemir (LEVEMIR) 100 UNIT/ML injection Inject 15 Units into the skin at bedtime.    . Multiple Vitamin (MULTIVITAMIN) tablet Take 1  tablet by mouth daily.    . nitroGLYCERIN (NITROSTAT) 0.4 MG SL tablet Place 0.4 mg under the tongue every 5 (five) minutes x 3 doses as needed for chest pain.     . traMADol (ULTRAM) 50 MG tablet Take 1 tablet (50 mg total) by mouth every 6 (six) hours as needed for moderate pain. 30 tablet 0   No current facility-administered medications for this visit.    PHYSICAL EXAMINATION: ECOG PERFORMANCE STATUS: 3 - Symptomatic, >50% confined to bed  BP 106/65 mmHg  Pulse 82  Temp(Src) 98.5 F (36.9 C) (Tympanic)  Resp 18  Ht 6' (1.829 m)  GENERAL:  Cachectic appearing Caucasian male patient Alert, no distress and comfortable.   He is in a wheelchair. Accompanied by his son.  EYES: no pallor or icterus;  OROPHARYNX: no thrush or ulceration;poor dentition. Marland Kitchen NECK: supple, no masses felt LYMPH: positive for palpable lymphadenopathy in the  bil axillary; inguinal regions cannot be examined LUNGS: clear to auscultation and  No wheeze or crackles HEART/CVS: regular rate & rhythm and no murmurs; No lower extremity edema ABDOMEN:abdomen soft, non-tender and normal bowel sounds Musculoskeletal:no cyanosis of digits and no clubbing; pt's left UE in sling [from recent humeral fracture]  PSYCH: alert & oriented x 3  NEURO: no focal motor/sensory deficits SKIN:  no rashes or significant lesions   LABORATORY DATA:  I have reviewed the data as listed    Component Value Date/Time   NA 130* 03/09/2016 1000   NA 143 06/04/2012 1240   K 4.1 03/09/2016 1000   K 4.2 06/04/2012 1240   CL 97* 03/09/2016 1000   CL 106 06/04/2012 1240   CO2 28 03/09/2016 1000   CO2 29 06/04/2012 1240   GLUCOSE 359* 03/09/2016 1000   GLUCOSE 208* 06/04/2012 1240   BUN 29* 03/09/2016 1000   BUN 26* 06/04/2012 1240   CREATININE 0.45* 03/09/2016 1000   CREATININE 0.82 06/04/2012 1240   CALCIUM 8.4* 03/09/2016 1000   CALCIUM 8.6 06/04/2012 1240   PROT 5.9* 03/09/2016 1000   PROT 6.0* 06/04/2012 1240   ALBUMIN 3.6 03/09/2016 1000   ALBUMIN 3.4 06/04/2012 1240   AST 20 03/09/2016 1000   AST 11* 06/04/2012 1240   ALT 14* 03/09/2016 1000   ALT 16 06/04/2012 1240   ALKPHOS 104 03/09/2016 1000   ALKPHOS 79 06/04/2012 1240   BILITOT 0.6 03/09/2016 1000   BILITOT 1.1* 06/04/2012 1240   GFRNONAA >60 03/09/2016 1000   GFRNONAA >60 06/04/2012 1240   GFRNONAA >60 02/14/2012 0854   GFRAA >60 03/09/2016 1000   GFRAA >60 06/04/2012 1240   GFRAA >60 02/14/2012 0854    No results found for: SPEP, UPEP  Lab Results  Component Value Date   WBC 165.6* 02/10/2016    NEUTROABS 9.3* 02/10/2016   HGB 10.9* 02/10/2016   HCT 33.8* 02/10/2016   MCV 86.4 02/10/2016   PLT 82* 02/10/2016      Chemistry      Component Value Date/Time   NA 130* 03/09/2016 1000   NA 143 06/04/2012 1240   K 4.1 03/09/2016 1000   K 4.2 06/04/2012 1240   CL 97* 03/09/2016 1000   CL 106 06/04/2012 1240   CO2 28 03/09/2016 1000   CO2 29 06/04/2012 1240   BUN 29* 03/09/2016 1000   BUN 26* 06/04/2012 1240   CREATININE 0.45* 03/09/2016 1000   CREATININE 0.82 06/04/2012 1240      Component Value Date/Time   CALCIUM 8.4* 03/09/2016 1000  CALCIUM 8.6 06/04/2012 1240   ALKPHOS 104 03/09/2016 1000   ALKPHOS 79 06/04/2012 1240   AST 20 03/09/2016 1000   AST 11* 06/04/2012 1240   ALT 14* 03/09/2016 1000   ALT 16 06/04/2012 1240   BILITOT 0.6 03/09/2016 1000   BILITOT 1.1* 06/04/2012 1240        ASSESSMENT & PLAN:   # CLL- recurrence/anemia with thrombocytopenia- patient is symptomatic [significant weight loss]- currently on ibrutinib 1 pill a day since feb 2017 Patient seems to be tolerating the pill daily well. As per the son- some clinical improvement noted [improved appetite/ more active].  # Today patient's white count is elevated at 140/ ALC- 132/Hb11.6- improving But worsening thrombocytopenia [C discussion below]  # Thrombocytopenia- worsening over the last few months today the platelet count is 58; no evidence of bleeding. 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 This should also improve from ibrutinib. We could try prednisone/ but patient's blood sugars are elevated at 350 today.  # Continue current therapy- follow-up with me in approximately 3weeks with labs.  # 15 minutes face-to-face with the patient discussing the above plan of care; more than 50% of time spent on natural history; counseling and coordination.    Cammie Sickle, MD 03/09/2016 10:16 AM

## 2016-03-09 NOTE — Progress Notes (Signed)
Pt states he feels well, no pain. No c/o. States that he lives at [facility] and they treat him well, feed him good. Bowels are good. No night sweats, no fevers.

## 2016-03-30 ENCOUNTER — Inpatient Hospital Stay (HOSPITAL_BASED_OUTPATIENT_CLINIC_OR_DEPARTMENT_OTHER): Payer: Medicare Other | Admitting: Internal Medicine

## 2016-03-30 ENCOUNTER — Inpatient Hospital Stay: Payer: Medicare Other | Attending: Internal Medicine

## 2016-03-30 VITALS — BP 119/82 | HR 91 | Temp 98.4°F | Resp 18

## 2016-03-30 DIAGNOSIS — Z794 Long term (current) use of insulin: Secondary | ICD-10-CM | POA: Insufficient documentation

## 2016-03-30 DIAGNOSIS — D696 Thrombocytopenia, unspecified: Secondary | ICD-10-CM | POA: Insufficient documentation

## 2016-03-30 DIAGNOSIS — C9110 Chronic lymphocytic leukemia of B-cell type not having achieved remission: Secondary | ICD-10-CM | POA: Insufficient documentation

## 2016-03-30 DIAGNOSIS — R54 Age-related physical debility: Secondary | ICD-10-CM

## 2016-03-30 DIAGNOSIS — Z79899 Other long term (current) drug therapy: Secondary | ICD-10-CM | POA: Diagnosis not present

## 2016-03-30 DIAGNOSIS — E119 Type 2 diabetes mellitus without complications: Secondary | ICD-10-CM

## 2016-03-30 DIAGNOSIS — Z9221 Personal history of antineoplastic chemotherapy: Secondary | ICD-10-CM | POA: Insufficient documentation

## 2016-03-30 LAB — CBC WITH DIFFERENTIAL/PLATELET
BASOS ABS: 0.2 10*3/uL — AB (ref 0–0.1)
Basophils Relative: 0 %
Eosinophils Absolute: 0.4 10*3/uL (ref 0–0.7)
HEMATOCRIT: 39.9 % — AB (ref 40.0–52.0)
Hemoglobin: 12.3 g/dL — ABNORMAL LOW (ref 13.0–18.0)
Lymphs Abs: 95.8 10*3/uL — ABNORMAL HIGH (ref 1.0–3.6)
MCH: 26.2 pg (ref 26.0–34.0)
MCHC: 30.9 g/dL — AB (ref 32.0–36.0)
MCV: 84.7 fL (ref 80.0–100.0)
MONO ABS: 2.6 10*3/uL — AB (ref 0.2–1.0)
NEUTROS ABS: 4.3 10*3/uL (ref 1.4–6.5)
Neutrophils Relative %: 4 %
PLATELETS: 71 10*3/uL — AB (ref 150–440)
RBC: 4.7 MIL/uL (ref 4.40–5.90)
RDW: 14.7 % — AB (ref 11.5–14.5)
WBC: 103.4 10*3/uL (ref 3.8–10.6)

## 2016-03-30 LAB — COMPREHENSIVE METABOLIC PANEL
ALT: 15 U/L — ABNORMAL LOW (ref 17–63)
ANION GAP: 4 — AB (ref 5–15)
AST: 20 U/L (ref 15–41)
Albumin: 3.5 g/dL (ref 3.5–5.0)
Alkaline Phosphatase: 101 U/L (ref 38–126)
BILIRUBIN TOTAL: 0.8 mg/dL (ref 0.3–1.2)
BUN: 25 mg/dL — ABNORMAL HIGH (ref 6–20)
CHLORIDE: 99 mmol/L — AB (ref 101–111)
CO2: 28 mmol/L (ref 22–32)
Calcium: 8.5 mg/dL — ABNORMAL LOW (ref 8.9–10.3)
Creatinine, Ser: 0.41 mg/dL — ABNORMAL LOW (ref 0.61–1.24)
GLUCOSE: 230 mg/dL — AB (ref 65–99)
Potassium: 4.2 mmol/L (ref 3.5–5.1)
Sodium: 131 mmol/L — ABNORMAL LOW (ref 135–145)
TOTAL PROTEIN: 6.4 g/dL — AB (ref 6.5–8.1)

## 2016-03-30 NOTE — Progress Notes (Signed)
Pt and pt's son left before labs had resulted. I called pt's son, Remo Lipps. Results reviewed with pt's son. Pt's wbc and plt count improving. Per Dr. Rogue Bussing, will will continue the ibrutinib as directed.  Teach back process performed with son.

## 2016-03-30 NOTE — Progress Notes (Signed)
[facility] OFFICE PROGRESS NOTE  Patient Care Team: Pcp Not In System as PCP - General   SUMMARY OF ONCOLOGIC HISTORY:  # [name] s/p Rituxan; NOV 2016- Recurrence; DEC 2016-CT A/P-- Bulky confluent retroperitoneal, mesenteric and bilateral pelvic lymphadenopathy, mildly increased since 2012]. 2017 JAN- Infusion reaction to Rituxan [stopped];   # FEB 2017- IBRUTINIB 1 pill a day; FISH- POSITIVE FOR HOMOZYGOUS 13Q DELETION;  POSITIVE FOR 17P GENE DELETION  # Poorly controlled DM/ cachexia/Left humerus fracture s/p fall [dec 2016]; pt in hospice [2016]  INTERVAL HISTORY: Poor/vague historian-  Accompanied by his son.  82 year old male patient frail/multiple comorbidities with above history of recurrent CLL- currently rehabilitation is on Ibrutinib 1 pill a day for since feb 2017.   In the interim patient had a repeat episode of dislocation of left shoulder; he is awaiting to have that adjusted through his doc at the nursing home.    As per the son patient's appetite is improving;  Patient still spends most of the time the chair/resting. He has been using a wheelchair. Patient denies diarrhea denies any lightheadedness or syncopal episodes.  Otherwise no fever no chills. No nausea no vomiting.    REVIEW OF SYSTEMS:  A complete 10 point review of system is done which is negative except mentioned above/history of present illness.   PAST MEDICAL HISTORY :  Past Medical History  Diagnosis Date  . Diabetes mellitus without complication ([place])   . Leukemia ([place])   . CLL (chronic lymphocytic leukemia) ([place])   . Anemia   . Neuromuscular disorder (HCC)     muscle weakness  . Humerus fracture     PAST SURGICAL HISTORY :  No past surgical history on file.  FAMILY HISTORY :   Family History  Problem Relation Age of Onset  . Cancer Sister     SOCIAL HISTORY:   Social History  Substance Use Topics  . Smoking status: Never Smoker   . Smokeless tobacco: Never Used  .  Alcohol Use: No    ALLERGIES:  has No Known Allergies.  MEDICATIONS:  Current Outpatient Prescriptions  Medication Sig Dispense Refill  . aluminum-magnesium hydroxide-simethicone (MAALOX) [id number] MG/5ML SUSP Take 30 mLs by mouth every 6 (six) hours as needed (for indigestion/heartburn).    . bisacodyl (DULCOLAX) 5 MG EC tablet Take 10 mg by mouth daily as needed for moderate constipation.    . feeding supplement, GLUCERNA SHAKE, (GLUCERNA SHAKE) LIQD Take 237 mLs by mouth 4 (four) times daily -  with meals and at bedtime.    Marland Kitchen glucagon (GLUCAGEN) 1 MG SOLR injection Inject 1 mg into the vein once as needed for low blood sugar.    . hydrocortisone cream 1 % Apply 1 application topically every 8 (eight) hours as needed for itching. Pt applies to posterior shoulders.    . ibrutinib (IMBRUVICA) 140 MG capsul Take 2 capsules (280 mg total) by mouth daily. Administer orally with water at approximately the same time every day (Patient taking differently: Take 140 mg by mouth daily. Administer orally with water at approximately the same time every day) 60 capsule 3  . insulin aspart (NOVOLOG) 100 UNIT/ML injection Inject 0-12 Units into the skin 2 (two) times daily. Pt uses per sliding scale:    0-150:  0 units  151-200:  4 units  201-250:  6 units  251-300:  8 units  301-350:  10 units 351-400:  12 units  Greater than 400:  Call MD    .  insulin detemir (LEVEMIR) 100 UNIT/ML injection Inject 15 Units into the skin at bedtime.    . Multiple Vitamin (MULTIVITAMIN) tablet Take 1 tablet by mouth daily.    . nitroGLYCERIN (NITROSTAT) 0.4 MG SL tablet Place 0.4 mg under the tongue every 5 (five) minutes x 3 doses as needed for chest pain.     . traMADol (ULTRAM) 50 MG tablet Take 1 tablet (50 mg total) by mouth every 6 (six) hours as needed for moderate pain. 30 tablet 0   No current facility-administered medications for this visit.    PHYSICAL EXAMINATION: ECOG PERFORMANCE STATUS: 3 -  Symptomatic, >50% confined to bed  BP 119/82 mmHg  Pulse 91  Temp(Src) 98.4 F (36.9 C) (Tympanic)  Resp 18  Wt   GENERAL: Cachectic appearing Caucasian male patient Alert, no distress and comfortable.   He is in a wheelchair. Accompanied by his son.  EYES: no pallor or icterus;  OROPHARYNX: no thrush or ulceration;poor dentition. Marland Kitchen NECK: supple, no masses felt LYMPH: positive for palpable lymphadenopathy in the  bil axillary; inguinal regions cannot be examined LUNGS: clear to auscultation and  No wheeze or crackles HEART/CVS: regular rate & rhythm and no murmurs; No lower extremity edema ABDOMEN:abdomen soft, non-tender and normal bowel sounds Musculoskeletal:no cyanosis of digits and no clubbing; pt's left UE in sling [from recent humeral fracture]  PSYCH: alert & oriented x 3  NEURO: no focal motor/sensory deficits SKIN:  no rashes or significant lesions   LABORATORY DATA:  I have reviewed the data as listed    Component Value Date/Time   NA 131* 03/30/2016 0937   NA 143 06/04/2012 1240   K 4.2 03/30/2016 0937   K 4.2 06/04/2012 1240   CL 99* 03/30/2016 0937   CL 106 06/04/2012 1240   CO2 28 03/30/2016 0937   CO2 29 06/04/2012 1240   GLUCOSE 230* 03/30/2016 0937   GLUCOSE 208* 06/04/2012 1240   BUN 25* 03/30/2016 0937   BUN 26* 06/04/2012 1240   CREATININE 0.41* 03/30/2016 0937   CREATININE 0.82 06/04/2012 1240   CALCIUM 8.5* 03/30/2016 0937   CALCIUM 8.6 06/04/2012 1240   PROT 6.4* 03/30/2016 0937   PROT 6.0* 06/04/2012 1240   ALBUMIN 3.5 03/30/2016 0937   ALBUMIN 3.4 06/04/2012 1240   AST 20 03/30/2016 0937   AST 11* 06/04/2012 1240   ALT 15* 03/30/2016 0937   ALT 16 06/04/2012 1240   ALKPHOS 101 03/30/2016 0937   ALKPHOS 79 06/04/2012 1240   BILITOT 0.8 03/30/2016 0937   BILITOT 1.1* 06/04/2012 1240   GFRNONAA >60 03/30/2016 0937   GFRNONAA >60 06/04/2012 1240   GFRNONAA >60 02/14/2012 0854   GFRAA >60 03/30/2016 0937   GFRAA >60 06/04/2012 1240    GFRAA >60 02/14/2012 0854    No results found for: SPEP, UPEP  Lab Results  Component Value Date   WBC 103.4* 03/30/2016   NEUTROABS 4.3 03/30/2016   HGB 12.3* 03/30/2016   HCT 39.9* 03/30/2016   MCV 84.7 03/30/2016   PLT 71* 03/30/2016      Chemistry      Component Value Date/Time   NA 131* 03/30/2016 0937   NA 143 06/04/2012 1240   K 4.2 03/30/2016 0937   K 4.2 06/04/2012 1240   CL 99* 03/30/2016 0937   CL 106 06/04/2012 1240   CO2 28 03/30/2016 0937   CO2 29 06/04/2012 1240   BUN 25* 03/30/2016 0937   BUN 26* 06/04/2012 1240   CREATININE 0.41*  03/30/2016 0937   CREATININE 0.82 06/04/2012 1240      Component Value Date/Time   CALCIUM 8.5* 03/30/2016 0937   CALCIUM 8.6 06/04/2012 1240   ALKPHOS 101 03/30/2016 0937   ALKPHOS 79 06/04/2012 1240   AST 20 03/30/2016 0937   AST 11* 06/04/2012 1240   ALT 15* 03/30/2016 0937   ALT 16 06/04/2012 1240   BILITOT 0.8 03/30/2016 0937   BILITOT 1.1* 06/04/2012 1240        ASSESSMENT & PLAN:   # CLL- recurrence/anemia with thrombocytopenia- patient is symptomatic [significant weight loss]- currently on ibrutinib 1 pill a day since feb 2017 Patient seems to be tolerating the pill daily well. As per the son- some clinical improvement noted [improved appetite/ more active].  # Today patient's white count is elevated at 103/ ALC- 95/Hb12.3 improving But continued thrombocytopenia [C discussion below]  # Thrombocytopenia- worsening over the last few months.  However, today the platelet count is 71 with out any evidence of bleeding. 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 However platelets have improved from 58 at last visit. Monitor for now.  # Continue current therapy- follow-up with me in approximately 4 weeks with labs.  # 15 minutes face-to-face with the patient's son- discussing the above plan of care; more than 50% of time spent on natural history; counseling and coordination.   Cammie Sickle, MD 03/30/2016 12:12 PM

## 2016-04-27 ENCOUNTER — Inpatient Hospital Stay: Payer: Medicare Other | Admitting: Internal Medicine

## 2016-04-27 ENCOUNTER — Telehealth: Payer: Self-pay | Admitting: *Deleted

## 2016-04-27 ENCOUNTER — Encounter: Payer: Self-pay | Admitting: *Deleted

## 2016-04-27 ENCOUNTER — Other Ambulatory Visit: Payer: Medicare Other

## 2016-04-27 ENCOUNTER — Inpatient Hospital Stay: Payer: Medicare Other | Attending: Internal Medicine | Admitting: Internal Medicine

## 2016-04-27 VITALS — BP 118/76 | HR 83 | Temp 98.2°F | Resp 18 | Wt 115.0 lb

## 2016-04-27 DIAGNOSIS — Z79899 Other long term (current) drug therapy: Secondary | ICD-10-CM | POA: Insufficient documentation

## 2016-04-27 DIAGNOSIS — E1165 Type 2 diabetes mellitus with hyperglycemia: Secondary | ICD-10-CM | POA: Insufficient documentation

## 2016-04-27 DIAGNOSIS — Z794 Long term (current) use of insulin: Secondary | ICD-10-CM | POA: Diagnosis not present

## 2016-04-27 DIAGNOSIS — R64 Cachexia: Secondary | ICD-10-CM | POA: Insufficient documentation

## 2016-04-27 DIAGNOSIS — C9112 Chronic lymphocytic leukemia of B-cell type in relapse: Secondary | ICD-10-CM | POA: Diagnosis present

## 2016-04-27 DIAGNOSIS — D649 Anemia, unspecified: Secondary | ICD-10-CM

## 2016-04-27 DIAGNOSIS — Z9221 Personal history of antineoplastic chemotherapy: Secondary | ICD-10-CM | POA: Diagnosis not present

## 2016-04-27 DIAGNOSIS — D6959 Other secondary thrombocytopenia: Secondary | ICD-10-CM | POA: Insufficient documentation

## 2016-04-27 DIAGNOSIS — Z8781 Personal history of (healed) traumatic fracture: Secondary | ICD-10-CM

## 2016-04-27 DIAGNOSIS — R54 Age-related physical debility: Secondary | ICD-10-CM | POA: Diagnosis not present

## 2016-04-27 DIAGNOSIS — Z9181 History of falling: Secondary | ICD-10-CM | POA: Diagnosis not present

## 2016-04-27 DIAGNOSIS — C9110 Chronic lymphocytic leukemia of B-cell type not having achieved remission: Secondary | ICD-10-CM

## 2016-04-27 NOTE — Telephone Encounter (Signed)
I attempted to return call , but got VM

## 2016-04-27 NOTE — Progress Notes (Signed)
[place] OFFICE PROGRESS NOTE  Patient Care Team: Pcp Not In System as PCP - General   SUMMARY OF ONCOLOGIC HISTORY:  # [name] s/p Rituxan; NOV 2016- Recurrence; DEC 2016-CT A/P-- Bulky confluent retroperitoneal, mesenteric and bilateral pelvic lymphadenopathy, mildly increased since 2012]. 2017 JAN- Infusion reaction to Rituxan [stopped];   # FEB 2017- IBRUTINIB 1 pill a day; FISH- POSITIVE FOR HOMOZYGOUS 13Q DELETION;  POSITIVE FOR 17P GENE DELETION  # Poorly controlled DM/ cachexia/Left humerus fracture s/p fall [dec 2016]; pt in hospice [2016]  INTERVAL HISTORY: Poor/vague historian-  Alone.   82 year old male patient frail/multiple comorbidities with above history of recurrent CLL- currently rehabilitation is on Ibrutinib 1 pill a day for since feb 2017.   Patient continues to live in the nursing home; as per the patient has stopped in physical therapy as there was not much improvement noted further.  Patient denies diarrhea denies any lightheadedness or syncopal episodes.  Otherwise no fever no chills. No nausea no vomiting. Appetite improving. Gaining weight.  REVIEW OF SYSTEMS:  A complete 10 point review of system is done which is negative except mentioned above/history of present illness.   PAST MEDICAL HISTORY :  Past Medical History  Diagnosis Date  . Diabetes mellitus without complication ([place])   . Leukemia ([place])   . CLL (chronic lymphocytic leukemia) ([place])   . Anemia   . Neuromuscular disorder (HCC)     muscle weakness  . Humerus fracture     PAST SURGICAL HISTORY :  No past surgical history on file.  FAMILY HISTORY :   Family History  Problem Relation Age of Onset  . Cancer Sister     SOCIAL HISTORY:   Social History  Substance Use Topics  . Smoking status: Never Smoker   . Smokeless tobacco: Never Used  . Alcohol Use: No    ALLERGIES:  has No Known Allergies.  MEDICATIONS:  Current Outpatient Prescriptions  Medication Sig  Dispense Refill  . aluminum-magnesium hydroxide-simethicone (MAALOX) [id number] MG/5ML SUSP Take 30 mLs by mouth every 6 (six) hours as needed (for indigestion/heartburn).    . bisacodyl (DULCOLAX) 5 MG EC tablet Take 10 mg by mouth daily as needed for moderate constipation.    . feeding supplement, GLUCERNA SHAKE, (GLUCERNA SHAKE) LIQD Take 237 mLs by mouth 4 (four) times daily -  with meals and at bedtime.    Marland Kitchen glucagon (GLUCAGEN) 1 MG SOLR injection Inject 1 mg into the vein once as needed for low blood sugar.    . hydrocortisone cream 1 % Apply 1 application topically every 8 (eight) hours as needed for itching. Pt applies to posterior shoulders.    . ibrutinib (IMBRUVICA) 140 MG capsul Take 2 capsules (280 mg total) by mouth daily. Administer orally with water at approximately the same time every day (Patient taking differently: Take 140 mg by mouth daily. Administer orally with water at approximately the same time every day) 60 capsule 3  . insulin aspart (NOVOLOG) 100 UNIT/ML injection Inject 0-12 Units into the skin 2 (two) times daily. Pt uses per sliding scale:    0-150:  0 units  151-200:  4 units  201-250:  6 units  251-300:  8 units  301-350:  10 units 351-400:  12 units  Greater than 400:  Call MD    . insulin detemir (LEVEMIR) 100 UNIT/ML injection Inject 15 Units into the skin at bedtime.    . Multiple Vitamin (MULTIVITAMIN) tablet Take 1 tablet by  mouth daily.    . nitroGLYCERIN (NITROSTAT) 0.4 MG SL tablet Place 0.4 mg under the tongue every 5 (five) minutes x 3 doses as needed for chest pain.     . traMADol (ULTRAM) 50 MG tablet Take 1 tablet (50 mg total) by mouth every 6 (six) hours as needed for moderate pain. 30 tablet 0   No current facility-administered medications for this visit.    PHYSICAL EXAMINATION: ECOG PERFORMANCE STATUS: 3 - Symptomatic, >50% confined to bed  BP 118/76 mmHg  Pulse 83  Temp(Src) 98.2 F (36.8 C) (Tympanic)  Resp 18  Wt 115 lb (52.164  kg)  GENERAL: Cachectic appearing Caucasian male patient Alert, no distress and comfortable.   He is in a wheelchair. He is alone. EYES: no pallor or icterus;  OROPHARYNX: no thrush or ulceration;poor dentition. Marland Kitchen NECK: supple, no masses felt LYMPH: positive for palpable lymphadenopathy in the  bil axillary; inguinal regions cannot be examined LUNGS: clear to auscultation and  No wheeze or crackles HEART/CVS: regular rate & rhythm and no murmurs; No lower extremity edema ABDOMEN:abdomen soft, non-tender and normal bowel sounds Musculoskeletal:no cyanosis of digits and no clubbing; pt's left UE in sling [from recent humeral fracture]  PSYCH: alert & oriented x 3  NEURO: no focal motor/sensory deficits SKIN:  no rashes or significant lesions   LABORATORY DATA:  I have reviewed the data as listed    Component Value Date/Time   NA 131* 03/30/2016 0937   NA 143 06/04/2012 1240   K 4.2 03/30/2016 0937   K 4.2 06/04/2012 1240   CL 99* 03/30/2016 0937   CL 106 06/04/2012 1240   CO2 28 03/30/2016 0937   CO2 29 06/04/2012 1240   GLUCOSE 230* 03/30/2016 0937   GLUCOSE 208* 06/04/2012 1240   BUN 25* 03/30/2016 0937   BUN 26* 06/04/2012 1240   CREATININE 0.41* 03/30/2016 0937   CREATININE 0.82 06/04/2012 1240   CALCIUM 8.5* 03/30/2016 0937   CALCIUM 8.6 06/04/2012 1240   PROT 6.4* 03/30/2016 0937   PROT 6.0* 06/04/2012 1240   ALBUMIN 3.5 03/30/2016 0937   ALBUMIN 3.4 06/04/2012 1240   AST 20 03/30/2016 0937   AST 11* 06/04/2012 1240   ALT 15* 03/30/2016 0937   ALT 16 06/04/2012 1240   ALKPHOS 101 03/30/2016 0937   ALKPHOS 79 06/04/2012 1240   BILITOT 0.8 03/30/2016 0937   BILITOT 1.1* 06/04/2012 1240   GFRNONAA >60 03/30/2016 0937   GFRNONAA >60 06/04/2012 1240   GFRNONAA >60 02/14/2012 0854   GFRAA >60 03/30/2016 0937   GFRAA >60 06/04/2012 1240   GFRAA >60 02/14/2012 0854    No results found for: SPEP, UPEP  Lab Results  Component Value Date   WBC 103.4* 03/30/2016    NEUTROABS 4.3 03/30/2016   HGB 12.3* 03/30/2016   HCT 39.9* 03/30/2016   MCV 84.7 03/30/2016   PLT 71* 03/30/2016      Chemistry      Component Value Date/Time   NA 131* 03/30/2016 0937   NA 143 06/04/2012 1240   K 4.2 03/30/2016 0937   K 4.2 06/04/2012 1240   CL 99* 03/30/2016 0937   CL 106 06/04/2012 1240   CO2 28 03/30/2016 0937   CO2 29 06/04/2012 1240   BUN 25* 03/30/2016 0937   BUN 26* 06/04/2012 1240   CREATININE 0.41* 03/30/2016 0937   CREATININE 0.82 06/04/2012 1240      Component Value Date/Time   CALCIUM 8.5* 03/30/2016 0937   CALCIUM  8.6 06/04/2012 1240   ALKPHOS 101 03/30/2016 0937   ALKPHOS 79 06/04/2012 1240   AST 20 03/30/2016 0937   AST 11* 06/04/2012 1240   ALT 15* 03/30/2016 0937   ALT 16 06/04/2012 1240   BILITOT 0.8 03/30/2016 0937   BILITOT 1.1* 06/04/2012 1240        ASSESSMENT & PLAN:   # CLL- recurrence/anemia with thrombocytopenia- patient is symptomatic [significant weight loss]- currently on ibrutinib 1 pill a day since feb 2017 Patient seems to be tolerating the pill daily well. As per the son- some clinical improvement noted [improved appetite/ more active].  # Labs done at patient's nursing home show -Today patient's white count is elevated at 69/ ALC- 78/Hb11.4-  improving But continued thrombocytopenia [C discussion below]  # Thrombocytopenia- stable over the last few months.  However, today the platelet count is 78 with out any evidence of bleeding. Question related to ITP  Monitor for now.  # Continue current therapy- follow-up with me in approximately 8 weeks with labs.    Cammie Sickle, MD 04/27/2016 11:28 AM

## 2016-04-27 NOTE — Progress Notes (Signed)
Patient's son refused for patient to be seen earlier then came back to apologize. Asking if his dad could be seen afterall.  Per Dr. Jacinto Reap. Check patient in for appointment.

## 2016-04-27 NOTE — Progress Notes (Signed)
Patient came to [place] today for appointment with MD. Labs drawn @ patient's facility yesterday.  When patient was called to exam room his son refused to allow him to be taken back.  Stated he wanted a copy of the lab work instead, that he was not paying for a MD visit when his dad did not need to be seen.  Stated he was doing great and his color was good.  Gave a copy of the lab work to him and advised him to stay for MD appointment.  Son again refused.  Heather, RN also went out and spoke to son and explained the patient is taking Imbruvica and from a clinical standpoint the MD will need to assess him.  The son was adamant that the patient was not going to be seen.  He told her to have the MD call him with his recommendations as to what he wants to do a month from now.  Patient left without being seen.   Dr. Rogue Bussing informed.

## 2016-06-08 ENCOUNTER — Ambulatory Visit: Payer: Medicare Other | Admitting: Internal Medicine

## 2016-06-08 ENCOUNTER — Other Ambulatory Visit: Payer: Medicare Other

## 2016-06-29 ENCOUNTER — Inpatient Hospital Stay: Payer: Medicare Other | Attending: Internal Medicine

## 2016-06-29 ENCOUNTER — Telehealth: Payer: Self-pay | Admitting: Internal Medicine

## 2016-06-29 DIAGNOSIS — C9112 Chronic lymphocytic leukemia of B-cell type in relapse: Secondary | ICD-10-CM | POA: Insufficient documentation

## 2016-06-29 DIAGNOSIS — C9110 Chronic lymphocytic leukemia of B-cell type not having achieved remission: Secondary | ICD-10-CM

## 2016-06-29 DIAGNOSIS — M7989 Other specified soft tissue disorders: Secondary | ICD-10-CM

## 2016-06-29 LAB — CBC WITH DIFFERENTIAL/PLATELET
Basophils Absolute: 0.1 10*3/uL (ref 0–0.1)
Basophils Relative: 0 %
Eosinophils Absolute: 0.3 10*3/uL (ref 0–0.7)
HEMATOCRIT: 43.2 % (ref 40.0–52.0)
Hemoglobin: 13.9 g/dL (ref 13.0–18.0)
LYMPHS ABS: 26.1 10*3/uL — AB (ref 1.0–3.6)
MCH: 26 pg (ref 26.0–34.0)
MCHC: 32.1 g/dL (ref 32.0–36.0)
MCV: 81.1 fL (ref 80.0–100.0)
MONO ABS: 0.9 10*3/uL (ref 0.2–1.0)
Monocytes Relative: 3 %
NEUTROS ABS: 3.5 10*3/uL (ref 1.4–6.5)
Neutrophils Relative %: 11 %
PLATELETS: 68 10*3/uL — AB (ref 150–440)
RBC: 5.33 MIL/uL (ref 4.40–5.90)
RDW: 15.8 % — AB (ref 11.5–14.5)
WBC: 31 10*3/uL — ABNORMAL HIGH (ref 3.8–10.6)

## 2016-06-29 LAB — COMPREHENSIVE METABOLIC PANEL
ALBUMIN: 3.6 g/dL (ref 3.5–5.0)
ALT: 21 U/L (ref 17–63)
AST: 27 U/L (ref 15–41)
Alkaline Phosphatase: 143 U/L — ABNORMAL HIGH (ref 38–126)
Anion gap: 6 (ref 5–15)
BUN: 23 mg/dL — AB (ref 6–20)
CHLORIDE: 97 mmol/L — AB (ref 101–111)
CO2: 30 mmol/L (ref 22–32)
Calcium: 8.6 mg/dL — ABNORMAL LOW (ref 8.9–10.3)
Creatinine, Ser: 0.5 mg/dL — ABNORMAL LOW (ref 0.61–1.24)
GFR calc Af Amer: >60 mL/min (ref >60)
GFR calc non Af Amer: >60 mL/min (ref >60)
GLUCOSE: 221 mg/dL — AB (ref 65–99)
POTASSIUM: 4 mmol/L (ref 3.5–5.1)
SODIUM: 133 mmol/L — AB (ref 135–145)
Total Bilirubin: 1.1 mg/dL (ref 0.3–1.2)
Total Protein: 6.3 g/dL — ABNORMAL LOW (ref 6.5–8.1)

## 2016-06-29 NOTE — Telephone Encounter (Signed)
Norm Parcel, patient's son to inform him that MD states labs look okay.  Platelets are still low @ 68.  Will continue to monitor for now. No new recommendations at this time.  Verbalized understanding.

## 2016-06-29 NOTE — Telephone Encounter (Signed)
Please call patient's son, [name]. He has some questions about his father's blood work. Thanks! [phone]

## 2016-11-16 ENCOUNTER — Inpatient Hospital Stay (HOSPITAL_BASED_OUTPATIENT_CLINIC_OR_DEPARTMENT_OTHER): Payer: Medicare Other | Admitting: Internal Medicine

## 2016-11-16 ENCOUNTER — Inpatient Hospital Stay: Payer: Medicare Other | Attending: Internal Medicine

## 2016-11-16 ENCOUNTER — Other Ambulatory Visit: Payer: Self-pay | Admitting: *Deleted

## 2016-11-16 VITALS — BP 122/72 | HR 71 | Temp 97.0°F | Resp 18 | Ht 72.0 in

## 2016-11-16 DIAGNOSIS — Z794 Long term (current) use of insulin: Secondary | ICD-10-CM

## 2016-11-16 DIAGNOSIS — E119 Type 2 diabetes mellitus without complications: Secondary | ICD-10-CM

## 2016-11-16 DIAGNOSIS — C9110 Chronic lymphocytic leukemia of B-cell type not having achieved remission: Secondary | ICD-10-CM

## 2016-11-16 DIAGNOSIS — Z79899 Other long term (current) drug therapy: Secondary | ICD-10-CM | POA: Insufficient documentation

## 2016-11-16 DIAGNOSIS — Z8781 Personal history of (healed) traumatic fracture: Secondary | ICD-10-CM | POA: Insufficient documentation

## 2016-11-16 DIAGNOSIS — Z9181 History of falling: Secondary | ICD-10-CM | POA: Insufficient documentation

## 2016-11-16 DIAGNOSIS — D649 Anemia, unspecified: Secondary | ICD-10-CM | POA: Diagnosis not present

## 2016-11-16 DIAGNOSIS — G709 Myoneural disorder, unspecified: Secondary | ICD-10-CM

## 2016-11-16 DIAGNOSIS — Z809 Family history of malignant neoplasm, unspecified: Secondary | ICD-10-CM

## 2016-11-16 DIAGNOSIS — D696 Thrombocytopenia, unspecified: Secondary | ICD-10-CM

## 2016-11-16 DIAGNOSIS — E1165 Type 2 diabetes mellitus with hyperglycemia: Secondary | ICD-10-CM

## 2016-11-16 LAB — COMPREHENSIVE METABOLIC PANEL
ALBUMIN: 3.6 g/dL (ref 3.5–5.0)
ALK PHOS: 133 U/L — AB (ref 38–126)
ALT: 19 U/L (ref 17–63)
AST: 22 U/L (ref 15–41)
Anion gap: 9 (ref 5–15)
BUN: 21 mg/dL — AB (ref 6–20)
CO2: 31 mmol/L (ref 22–32)
CREATININE: 0.57 mg/dL — AB (ref 0.61–1.24)
Calcium: 9 mg/dL (ref 8.9–10.3)
Chloride: 95 mmol/L — ABNORMAL LOW (ref 101–111)
GFR calc Af Amer: >60 mL/min (ref >60)
GLUCOSE: 194 mg/dL — AB (ref 65–99)
POTASSIUM: 4.1 mmol/L (ref 3.5–5.1)
Sodium: 135 mmol/L (ref 135–145)
TOTAL PROTEIN: 6.5 g/dL (ref 6.5–8.1)
Total Bilirubin: 0.9 mg/dL (ref 0.3–1.2)

## 2016-11-16 LAB — CBC WITH DIFFERENTIAL/PLATELET
BASOS ABS: 0.1 10*3/uL (ref 0–0.1)
Basophils Relative: 0 %
Eosinophils Absolute: 0.2 10*3/uL (ref 0–0.7)
Eosinophils Relative: 1 %
HEMATOCRIT: 44.9 % (ref 40.0–52.0)
HEMOGLOBIN: 14.5 g/dL (ref 13.0–18.0)
LYMPHS PCT: 82 %
Lymphs Abs: 16.7 10*3/uL — ABNORMAL HIGH (ref 1.0–3.6)
MCH: 26.4 pg (ref 26.0–34.0)
MCHC: 32.3 g/dL (ref 32.0–36.0)
MCV: 81.8 fL (ref 80.0–100.0)
MONO ABS: 0.7 10*3/uL (ref 0.2–1.0)
Monocytes Relative: 4 %
NEUTROS ABS: 2.5 10*3/uL (ref 1.4–6.5)
NEUTROS PCT: 13 %
Platelets: 98 10*3/uL — ABNORMAL LOW (ref 150–440)
RBC: 5.49 MIL/uL (ref 4.40–5.90)
RDW: 15 % — AB (ref 11.5–14.5)
WBC: 20.3 10*3/uL — ABNORMAL HIGH (ref 3.8–10.6)

## 2016-11-16 LAB — LACTATE DEHYDROGENASE: LDH: 137 U/L (ref 98–192)

## 2016-11-16 NOTE — Progress Notes (Signed)
[facility] OFFICE PROGRESS NOTE  Patient Care Team: Pcp Not In System as PCP - General   SUMMARY OF ONCOLOGIC HISTORY:  Oncology History   # [name] s/p Rituxan; NOV 2016- Recurrence; DEC 2016-CT A/P-- Bulky confluent retroperitoneal, mesenteric and bilateral pelvic lymphadenopathy, mildly increased since 2012]. 2017 JAN- Infusion reaction to Rituxan [stopped];   # FEB 2017- IBRUTINIB 1 pill a day; FISH- POSITIVE FOR HOMOZYGOUS 13Q DELETION;  POSITIVE FOR 17P GENE DELETION  # Poorly controlled DM/ cachexia/Left humerus fracture s/p fall [dec 2016]; pt in hospice [2016]     CLL (chronic lymphocytic leukemia) ([place])     INTERVAL HISTORY: Poor/vague historian-  Accompanied by his son.   82 year old male patient frail/multiple comorbidities with above history of recurrent CLL- currently rehabilitation is on Ibrutinib 1 pill a day for since feb 2017.   Patient continues to live in the nursing home; patient is gaining weight. He is more coherent. Patient denies diarrhea denies any lightheadedness or syncopal episodes.  Otherwise no fever no chills. No nausea no vomiting. No palpitations or chest pain.  REVIEW OF SYSTEMS:  A complete 10 point review of system is done which is negative except mentioned above/history of present illness.   PAST MEDICAL HISTORY :  Past Medical History:  Diagnosis Date  . Anemia   . CLL (chronic lymphocytic leukemia) ([place])   . Diabetes mellitus without complication ([place])   . Humerus fracture   . Leukemia ([place])   . Neuromuscular disorder ([place])    muscle weakness    PAST SURGICAL HISTORY :  No past surgical history on file.  FAMILY HISTORY :   Family History  Problem Relation Age of Onset  . Cancer Sister     SOCIAL HISTORY:   Social History  Substance Use Topics  . Smoking status: Never Smoker  . Smokeless tobacco: Never Used  . Alcohol use No    ALLERGIES:  has No Known Allergies.  MEDICATIONS:  Current Outpatient  Prescriptions  Medication Sig Dispense Refill  . aluminum-magnesium hydroxide-simethicone (MAALOX) I037812 MG/5ML SUSP Take 30 mLs by mouth every 6 (six) hours as needed (for indigestion/heartburn).    . bisacodyl (DULCOLAX) 5 MG EC tablet Take 10 mg by mouth daily as needed for moderate constipation.    . feeding supplement, GLUCERNA SHAKE, (GLUCERNA SHAKE) LIQD Take 237 mLs by mouth 4 (four) times daily -  with meals and at bedtime.    Marland Kitchen ibrutinib (IMBRUVICA) 140 MG capsul Take 2 capsules (280 mg total) by mouth daily. Administer orally with water at approximately the same time every day (Patient taking differently: Take 140 mg by mouth daily. Administer orally with water at approximately the same time every day) 60 capsule 3  . insulin aspart (NOVOLOG) 100 UNIT/ML injection Inject 0-12 Units into the skin 2 (two) times daily. Pt uses per sliding scale:    0-150:  0 units  151-200:  4 units  201-250:  6 units  251-300:  8 units  301-350:  10 units 351-400:  12 units  Greater than 400:  Call MD    . insulin detemir (LEVEMIR) 100 UNIT/ML injection Inject 15 Units into the skin at bedtime.    . Multiple Vitamin (MULTIVITAMIN) tablet Take 1 tablet by mouth daily.    . traMADol (ULTRAM) 50 MG tablet Take 1 tablet (50 mg total) by mouth every 6 (six) hours as needed for moderate pain. 30 tablet 0  . glucagon (GLUCAGEN) 1 MG SOLR injection Inject  1 mg into the vein once as needed for low blood sugar.    . hydrocortisone cream 1 % Apply 1 application topically every 8 (eight) hours as needed for itching. Pt applies to posterior shoulders.    . nitroGLYCERIN (NITROSTAT) 0.4 MG SL tablet Place 0.4 mg under the tongue every 5 (five) minutes x 3 doses as needed for chest pain.      No current facility-administered medications for this visit.     PHYSICAL EXAMINATION: ECOG PERFORMANCE STATUS: 3 - Symptomatic, >50% confined to bed  BP 122/72 (BP Location: Right Arm, Patient Position: Sitting)    Pulse 71   Temp 97 F (36.1 C) (Tympanic)   Resp 18   Ht 6' (1.829 m)   GENERAL: Cachectic appearing Caucasian male patient Alert, no distress and comfortable.   He is in a wheelchair. He is alone. EYES: no pallor or icterus;  OROPHARYNX: no thrush or ulceration;poor dentition. Marland Kitchen NECK: supple, no masses felt LYMPH: positive for palpable lymphadenopathy in the  bil axillary; inguinal regions cannot be examined LUNGS: clear to auscultation and  No wheeze or crackles HEART/CVS: regular rate & rhythm and no murmurs; No lower extremity edema ABDOMEN:abdomen soft, non-tender and normal bowel sounds Musculoskeletal:no cyanosis of digits and no clubbing; pt's left UE in sling [from recent humeral fracture]  PSYCH: alert & oriented x 3  NEURO: no focal motor/sensory deficits SKIN:  no rashes or significant lesions   LABORATORY DATA:  I have reviewed the data as listed    Component Value Date/Time   NA 135 11/16/2016 1145   NA 143 06/04/2012 1240   K 4.1 11/16/2016 1145   K 4.2 06/04/2012 1240   CL 95 (L) 11/16/2016 1145   CL 106 06/04/2012 1240   CO2 31 11/16/2016 1145   CO2 29 06/04/2012 1240   GLUCOSE 194 (H) 11/16/2016 1145   GLUCOSE 208 (H) 06/04/2012 1240   BUN 21 (H) 11/16/2016 1145   BUN 26 (H) 06/04/2012 1240   CREATININE 0.57 (L) 11/16/2016 1145   CREATININE 0.82 06/04/2012 1240   CALCIUM 9.0 11/16/2016 1145   CALCIUM 8.6 06/04/2012 1240   PROT 6.5 11/16/2016 1145   PROT 6.0 (L) 06/04/2012 1240   ALBUMIN 3.6 11/16/2016 1145   ALBUMIN 3.4 06/04/2012 1240   AST 22 11/16/2016 1145   AST 11 (L) 06/04/2012 1240   ALT 19 11/16/2016 1145   ALT 16 06/04/2012 1240   ALKPHOS 133 (H) 11/16/2016 1145   ALKPHOS 79 06/04/2012 1240   BILITOT 0.9 11/16/2016 1145   BILITOT 1.1 (H) 06/04/2012 1240   GFRNONAA >60 11/16/2016 1145   GFRNONAA >60 06/04/2012 1240   GFRAA >60 11/16/2016 1145   GFRAA >60 06/04/2012 1240    No results found for: SPEP, UPEP  Lab Results  Component  Value Date   WBC 20.3 (H) 11/16/2016   NEUTROABS 2.5 11/16/2016   HGB 14.5 11/16/2016   HCT 44.9 11/16/2016   MCV 81.8 11/16/2016   PLT 98 (L) 11/16/2016      Chemistry      Component Value Date/Time   NA 135 11/16/2016 1145   NA 143 06/04/2012 1240   K 4.1 11/16/2016 1145   K 4.2 06/04/2012 1240   CL 95 (L) 11/16/2016 1145   CL 106 06/04/2012 1240   CO2 31 11/16/2016 1145   CO2 29 06/04/2012 1240   BUN 21 (H) 11/16/2016 1145   BUN 26 (H) 06/04/2012 1240   CREATININE 0.57 (L) 11/16/2016 1145  CREATININE 0.82 06/04/2012 1240      Component Value Date/Time   CALCIUM 9.0 11/16/2016 1145   CALCIUM 8.6 06/04/2012 1240   ALKPHOS 133 (H) 11/16/2016 1145   ALKPHOS 79 06/04/2012 1240   AST 22 11/16/2016 1145   AST 11 (L) 06/04/2012 1240   ALT 19 11/16/2016 1145   ALT 16 06/04/2012 1240   BILITOT 0.9 11/16/2016 1145   BILITOT 1.1 (H) 06/04/2012 1240        ASSESSMENT & PLAN:  CLL (chronic lymphocytic leukemia) ([place]) # CLL- 17p del;  recurrence/anemia with thrombocytopenia-  patient is symptomatic currently on ibrutinib 1 pill a day since feb 2017 Patient seems to be tolerating the pill daily well. As per the son- significant clinical improvement noted [improved appetite/ more active]. Patient's white count is currently 20; hemoglobin 13 platelets 98. No untoward side effects noted from iburtinib.   # Continue current therapy- follow-up with me in approximately 12 weeks with labs. Discussed with patient's son.     Cammie Sickle, MD 11/16/2016 1:36 PM

## 2016-11-16 NOTE — Assessment & Plan Note (Addendum)
#   CLL- 17p del;  recurrence/anemia with thrombocytopenia-  patient is symptomatic currently on ibrutinib 1 pill a day since feb 2017 Patient seems to be tolerating the pill daily well. As per the son- significant clinical improvement noted [improved appetite/ more active]. Patient's white count is currently 20; hemoglobin 13 platelets 98. No untoward side effects noted from iburtinib.   # Continue current therapy- follow-up with me in approximately 12 weeks with labs. Discussed with patient's son.

## 2016-11-16 NOTE — Progress Notes (Signed)
Patient alert and oriented to name, date of birth, place. Response to nurses assessment questions regarding symptoms. Mar reviewed from [facility] health care.

## 2016-11-23 ENCOUNTER — Ambulatory Visit: Payer: Medicare Other | Admitting: Internal Medicine

## 2016-12-16 ENCOUNTER — Telehealth: Payer: Self-pay | Admitting: *Deleted

## 2016-12-16 NOTE — Telephone Encounter (Signed)
rcvd incoming fax from [employer] to renew pt's assistance for Imbruvica. I personally contacted pt's son (HPOA) to ask son to come sign forms and provide proof of annual income for pt. He will come tomorrow with this information along with HPOA legal document forms.

## 2016-12-23 NOTE — Telephone Encounter (Signed)
Patient's son dropped off pt's signed imbruvica application on Tuesday in [facility]. Spoke with Son, there was no financial documents for proof of income that accompanied this paperwork. Son states that he will get his wife to bring the proof of income papers to the cancer center.  Explained to son that I am not able to submit the paperwork to [employer] w/o the financial income documentation. He gave verbal understanding.

## 2016-12-24 ENCOUNTER — Encounter: Payer: Self-pay | Admitting: *Deleted

## 2016-12-24 NOTE — Progress Notes (Signed)
Patient's daughter in law came to clinic today to bring proof of income for the pt's Imbruvica application with [employer]. She only brought the 2016 income and not the 2017 income. I asked the daughter in law to retrieve the 2017 income and bring these papers back to the cancer center.

## 2017-01-18 ENCOUNTER — Telehealth: Payer: Self-pay | Admitting: *Deleted

## 2017-01-18 NOTE — Telephone Encounter (Signed)
Received noticed from answering service danielle from. [employer] contacted cancer center regarding patient's imbruvica assistance. Pt not eligible for assistance. Was asked to call her back at 1 [phone]  I personally contacted [facility] today to determine why patient was denied - spoke with Dominica.  Per Lorriane Shire, pt was denied because pt's address is the nursing home at [facility] health care per documentation at [facility] and [place].  I explained that patient has been living in a nursing home for several years. This address was even on the last application submitted and the patient was not denied last year.  I explained that the patient is in the assisted living as the patient is unable to take care of his ADLS. Lorriane Shire asked if the "doctors at the healthcare facility were the ordering providers because if so they are not allowed to dispense the drug."   I further explained that Dr. Rogue Bussing has been the patient's provider. The patient has been coming to the clinic to monitor his labs and f/u on a bi-monthly basis. He has been doing well on the drug and tolerating the drug well. He has had improvement in his disease process and personal wellbeing. The patient's drug has been monitor by Dr. Rogue Bussing and ordered by Dr. Rogue Bussing and not ordered or monitored by the healthcare facility providers. The nurses at the facility administer the drug just as the patient would take the drug (if patient would be in his own home). The facility follows the Dr. Aletha Halim orders.   I stressed that the rationale to deny patient support is not a valid reason. I asked Lorriane Shire if I could speak to a higher level of personal at [employer].  I asked why [place] and [place] determined to this guideline/ reason to deny the patient his drug financial assistance.  I stressed that patient meets financial guidelines and in my professional opinion this should not be a valid reason given to deny pt  financial coverage.  Lorriane Shire stated that the [facility] will be given the msg and will call our office back tomorrow. I provided Lorriane Shire with 2 phone numbers to reach me asap.

## 2017-02-25 ENCOUNTER — Other Ambulatory Visit: Payer: Self-pay | Admitting: *Deleted

## 2017-02-25 DIAGNOSIS — C9110 Chronic lymphocytic leukemia of B-cell type not having achieved remission: Secondary | ICD-10-CM

## 2017-02-25 NOTE — Telephone Encounter (Addendum)
States that patient is completely out of medicine and that there is a discrepancy about his dose, he has only been receiving 1 capsule a day and he is asking where the other pills are going that he should be receiving. Please return call [phone]

## 2017-02-25 NOTE — Telephone Encounter (Signed)
Patient has refills until June the facilty just needs to contact their pharmacy. Mr Ellender informed of this and will call the facility regarding this

## 2017-03-08 ENCOUNTER — Inpatient Hospital Stay: Payer: Medicare Other

## 2017-03-08 ENCOUNTER — Inpatient Hospital Stay: Payer: Medicare Other | Admitting: Internal Medicine

## 2017-03-12 ENCOUNTER — Telehealth: Payer: Self-pay | Admitting: Hematology and Oncology

## 2017-03-12 NOTE — Telephone Encounter (Signed)
Re:  Ibrutinib has not arrived  Patient's son called today regarding his frustration that his father has not received his ibrutinib yet even though it was authorized through 05/2017 and it was working.  I discussed the last communication Renita Papa, RN had with Wynetta Emery and [place].  I stated that Renita Papa, RN will call him on Monday.  Contact number [phone].  Lequita Asal, MD

## 2017-03-14 NOTE — Telephone Encounter (Signed)
Reviewed chart- pt has medicare A&B.  I personally contacted [facility]. Spoke with Social research officer, government. She knows that pt has been w/o his medication and is actively working with her clinical team to determine the best way to get the patient his Imbruvica.  Explained to Suanne Marker that the pt needs to continue the Imbruvica. The drug was working well with the patient and the patient has not had his medication for sometime. Explained to Suanne Marker that [employer] declined pt assistance since pt lives in a facility. Suanne Marker will call the cancer center back with further updates as she knows more.  Call returned to patient's son. Reassured pt's son that Suanne Marker is actively working on his dad's drug assistance. He gave verbal understanding of the plan of care.

## 2017-03-14 NOTE — Telephone Encounter (Signed)
Previously discussed care with Elease Etienne, social worker in cancer center.  Per social worker- [facility] has "3 Social Workers that should be able to help provide assistance to patient/pt's son.  per Education officer, museum-  If the patient is at [facility] under Medicare payment, the facility is required to provide all drugs to patient. If he is under Medicaid, then the Medicaid should pay for the drug."

## 2017-03-15 ENCOUNTER — Inpatient Hospital Stay: Payer: No Typology Code available for payment source

## 2017-03-15 ENCOUNTER — Inpatient Hospital Stay: Payer: No Typology Code available for payment source | Attending: Internal Medicine | Admitting: Internal Medicine

## 2017-03-15 VITALS — BP 124/83 | HR 94 | Temp 95.9°F | Resp 18 | Wt 124.5 lb

## 2017-03-15 DIAGNOSIS — R54 Age-related physical debility: Secondary | ICD-10-CM | POA: Insufficient documentation

## 2017-03-15 DIAGNOSIS — E119 Type 2 diabetes mellitus without complications: Secondary | ICD-10-CM

## 2017-03-15 DIAGNOSIS — C9110 Chronic lymphocytic leukemia of B-cell type not having achieved remission: Secondary | ICD-10-CM

## 2017-03-15 DIAGNOSIS — Z794 Long term (current) use of insulin: Secondary | ICD-10-CM | POA: Diagnosis not present

## 2017-03-15 DIAGNOSIS — Z79899 Other long term (current) drug therapy: Secondary | ICD-10-CM | POA: Insufficient documentation

## 2017-03-15 DIAGNOSIS — Z9221 Personal history of antineoplastic chemotherapy: Secondary | ICD-10-CM | POA: Diagnosis not present

## 2017-03-15 DIAGNOSIS — Z888 Allergy status to other drugs, medicaments and biological substances status: Secondary | ICD-10-CM

## 2017-03-15 LAB — CBC WITH DIFFERENTIAL/PLATELET
BASOS PCT: 1 %
Basophils Absolute: 0.2 10*3/uL — ABNORMAL HIGH (ref 0–0.1)
Eosinophils Absolute: 0.8 10*3/uL — ABNORMAL HIGH (ref 0–0.7)
Eosinophils Relative: 4 %
HEMATOCRIT: 41.8 % (ref 40.0–52.0)
HEMOGLOBIN: 13.5 g/dL (ref 13.0–18.0)
Lymphocytes Relative: 75 %
Lymphs Abs: 15.2 10*3/uL — ABNORMAL HIGH (ref 1.0–3.6)
MCH: 26.7 pg (ref 26.0–34.0)
MCHC: 32.3 g/dL (ref 32.0–36.0)
MCV: 82.6 fL (ref 80.0–100.0)
MONOS PCT: 4 %
Monocytes Absolute: 0.8 10*3/uL (ref 0.2–1.0)
NEUTROS ABS: 3.2 10*3/uL (ref 1.4–6.5)
NEUTROS PCT: 16 %
Platelets: 162 10*3/uL (ref 150–440)
RBC: 5.06 MIL/uL (ref 4.40–5.90)
RDW: 14.5 % (ref 11.5–14.5)
WBC: 20.2 10*3/uL — ABNORMAL HIGH (ref 3.8–10.6)

## 2017-03-15 LAB — LACTATE DEHYDROGENASE: LDH: 159 U/L (ref 98–192)

## 2017-03-15 LAB — COMPREHENSIVE METABOLIC PANEL
ALK PHOS: 127 U/L — AB (ref 38–126)
ALT: 20 U/L (ref 17–63)
ANION GAP: 5 (ref 5–15)
AST: 27 U/L (ref 15–41)
Albumin: 3.4 g/dL — ABNORMAL LOW (ref 3.5–5.0)
BUN: 21 mg/dL — ABNORMAL HIGH (ref 6–20)
CALCIUM: 8.8 mg/dL — AB (ref 8.9–10.3)
CHLORIDE: 99 mmol/L — AB (ref 101–111)
CO2: 28 mmol/L (ref 22–32)
CREATININE: 0.52 mg/dL — AB (ref 0.61–1.24)
GFR calc Af Amer: >60 mL/min (ref >60)
Glucose, Bld: 214 mg/dL — ABNORMAL HIGH (ref 65–99)
Potassium: 3.9 mmol/L (ref 3.5–5.1)
Sodium: 132 mmol/L — ABNORMAL LOW (ref 135–145)
Total Bilirubin: 1 mg/dL (ref 0.3–1.2)
Total Protein: 6.3 g/dL — ABNORMAL LOW (ref 6.5–8.1)

## 2017-03-15 NOTE — Progress Notes (Signed)
[facility] OFFICE PROGRESS NOTE  Patient Care Team: Pcp Not In System as PCP - General   SUMMARY OF ONCOLOGIC HISTORY:  Oncology History   # [name] s/p Rituxan; NOV 2016- Recurrence; DEC 2016-CT A/P-- Bulky confluent retroperitoneal, mesenteric and bilateral pelvic lymphadenopathy, mildly increased since 2012]. 2017 JAN- Infusion reaction to Rituxan [stopped];   # FEB 2017- IBRUTINIB 1 pill a day; FISH- POSITIVE FOR HOMOZYGOUS 13Q DELETION;  POSITIVE FOR 17P GENE DELETION  # Poorly controlled DM/ cachexia/Left humerus fracture s/p fall [dec 2016]; pt in hospice [2016]     CLL (chronic lymphocytic leukemia) ([place])     INTERVAL HISTORY: Poor/vague historian-  Accompanied by his son.   83 year old male patient frail/multiple comorbidities with above history of recurrent CLL- currently rehabilitation is on Ibrutinib 2 pill a day for since feb 2017. Ibrutinib were stopped approximately month ago [Feb 28th] because of insurance reasons  Patient continues to live in the nursing home; patient is gaining weight-most recently 124 pounds.Marland Kitchen He is more coherent. Unfortunately patient does not get around a whole lot. He is worried about falls.    Patient denies diarrhea denies any lightheadedness or syncopal episodes.  Otherwise no fever no chills. No nausea no vomiting. No palpitations or chest pain.  REVIEW OF SYSTEMS:  A complete 10 point review of system is done which is negative except mentioned above/history of present illness.   PAST MEDICAL HISTORY :  Past Medical History:  Diagnosis Date  . Anemia   . CLL (chronic lymphocytic leukemia) ([place])   . Diabetes mellitus without complication ([place])   . Humerus fracture   . Leukemia ([place])   . Neuromuscular disorder ([place])    muscle weakness    PAST SURGICAL HISTORY :  No past surgical history on file.  FAMILY HISTORY :   Family History  Problem Relation Age of Onset  . Cancer Sister     SOCIAL HISTORY:   Social  History  Substance Use Topics  . Smoking status: Never Smoker  . Smokeless tobacco: Never Used  . Alcohol use No    ALLERGIES:  has No Known Allergies.  MEDICATIONS:  Current Outpatient Prescriptions  Medication Sig Dispense Refill  . aluminum-magnesium hydroxide-simethicone (MAALOX) 211-941-74 MG/5ML SUSP Take 30 mLs by mouth every 6 (six) hours as needed (for indigestion/heartburn).    . bisacodyl (DULCOLAX) 5 MG EC tablet Take 10 mg by mouth daily as needed for moderate constipation.    . feeding supplement, GLUCERNA SHAKE, (GLUCERNA SHAKE) LIQD Take 237 mLs by mouth 4 (four) times daily -  with meals and at bedtime.    . insulin aspart (NOVOLOG) 100 UNIT/ML injection Inject 0-12 Units into the skin 2 (two) times daily. Pt uses per sliding scale:    0-150:  0 units  151-200:  4 units  201-250:  6 units  251-300:  8 units  301-350:  10 units 351-400:  12 units  Greater than 400:  Call MD    . insulin detemir (LEVEMIR) 100 UNIT/ML injection Inject 15 Units into the skin at bedtime.    . Multiple Vitamin (MULTIVITAMIN) tablet Take 1 tablet by mouth daily.    Marland Kitchen NOVOLOG FLEXPEN 100 UNIT/ML FlexPen     . glucagon (GLUCAGEN) 1 MG SOLR injection Inject 1 mg into the vein once as needed for low blood sugar.    . hydrocortisone cream 1 % Apply 1 application topically every 8 (eight) hours as needed for itching. Pt applies to posterior  shoulders.    . ibrutinib (IMBRUVICA) 140 MG capsul Take 2 capsules (280 mg total) by mouth daily. Administer orally with water at approximately the same time every day (Patient not taking: Reported on 03/15/2017) 60 capsule 3  . nitroGLYCERIN (NITROSTAT) 0.4 MG SL tablet Place 0.4 mg under the tongue every 5 (five) minutes x 3 doses as needed for chest pain.     . traMADol (ULTRAM) 50 MG tablet Take 1 tablet (50 mg total) by mouth every 6 (six) hours as needed for moderate pain. (Patient not taking: Reported on 03/15/2017) 30 tablet 0   No current  facility-administered medications for this visit.     PHYSICAL EXAMINATION: ECOG PERFORMANCE STATUS: 3 - Symptomatic, >50% confined to bed  BP 124/83 (BP Location: Right Arm, Patient Position: Sitting)   Pulse 94   Temp (!) 95.9 F (35.5 C) (Tympanic)   Resp 18   Wt 124 lb 8 oz (56.5 kg) Comment: per whitney at [facility] health care on 03/08/17  BMI 16.89 kg/m   GENERAL: Cachectic appearing Caucasian male patient Alert, no distress and comfortable.   He is in a wheelchair. He is accompanied by his son. Marland Kitchen EYES: no pallor or icterus;  OROPHARYNX: no thrush or ulceration;poor dentition. Marland Kitchen NECK: supple, no masses felt LYMPH: positive for palpable lymphadenopathy in the  bil axillary; inguinal regions cannot be examined LUNGS: clear to auscultation and  No wheeze or crackles HEART/CVS: regular rate & rhythm and no murmurs; No lower extremity edema ABDOMEN:abdomen soft, non-tender and normal bowel sounds Musculoskeletal:no cyanosis of digits and no clubbing; pt's left UE in sling [from humeral fracture]  PSYCH: alert & oriented x 3  NEURO: no focal motor/sensory deficits SKIN:  no rashes or significant lesions   LABORATORY DATA:  I have reviewed the data as listed    Component Value Date/Time   NA 132 (L) 03/15/2017 1340   NA 143 06/04/2012 1240   K 3.9 03/15/2017 1340   K 4.2 06/04/2012 1240   CL 99 (L) 03/15/2017 1340   CL 106 06/04/2012 1240   CO2 28 03/15/2017 1340   CO2 29 06/04/2012 1240   GLUCOSE 214 (H) 03/15/2017 1340   GLUCOSE 208 (H) 06/04/2012 1240   BUN 21 (H) 03/15/2017 1340   BUN 26 (H) 06/04/2012 1240   CREATININE 0.52 (L) 03/15/2017 1340   CREATININE 0.82 06/04/2012 1240   CALCIUM 8.8 (L) 03/15/2017 1340   CALCIUM 8.6 06/04/2012 1240   PROT 6.3 (L) 03/15/2017 1340   PROT 6.0 (L) 06/04/2012 1240   ALBUMIN 3.4 (L) 03/15/2017 1340   ALBUMIN 3.4 06/04/2012 1240   AST 27 03/15/2017 1340   AST 11 (L) 06/04/2012 1240   ALT 20 03/15/2017 1340   ALT 16  06/04/2012 1240   ALKPHOS 127 (H) 03/15/2017 1340   ALKPHOS 79 06/04/2012 1240   BILITOT 1.0 03/15/2017 1340   BILITOT 1.1 (H) 06/04/2012 1240   GFRNONAA >60 03/15/2017 1340   GFRNONAA >60 06/04/2012 1240   GFRAA >60 03/15/2017 1340   GFRAA >60 06/04/2012 1240    No results found for: SPEP, UPEP  Lab Results  Component Value Date   WBC 20.2 (H) 03/15/2017   NEUTROABS 3.2 03/15/2017   HGB 13.5 03/15/2017   HCT 41.8 03/15/2017   MCV 82.6 03/15/2017   PLT 162 03/15/2017      Chemistry      Component Value Date/Time   NA 132 (L) 03/15/2017 1340   NA 143 06/04/2012 1240  K 3.9 03/15/2017 1340   K 4.2 06/04/2012 1240   CL 99 (L) 03/15/2017 1340   CL 106 06/04/2012 1240   CO2 28 03/15/2017 1340   CO2 29 06/04/2012 1240   BUN 21 (H) 03/15/2017 1340   BUN 26 (H) 06/04/2012 1240   CREATININE 0.52 (L) 03/15/2017 1340   CREATININE 0.82 06/04/2012 1240      Component Value Date/Time   CALCIUM 8.8 (L) 03/15/2017 1340   CALCIUM 8.6 06/04/2012 1240   ALKPHOS 127 (H) 03/15/2017 1340   ALKPHOS 79 06/04/2012 1240   AST 27 03/15/2017 1340   AST 11 (L) 06/04/2012 1240   ALT 20 03/15/2017 1340   ALT 16 06/04/2012 1240   BILITOT 1.0 03/15/2017 1340   BILITOT 1.1 (H) 06/04/2012 1240        ASSESSMENT & PLAN:  CLL (chronic lymphocytic leukemia) ([place]) # CLL- 17p del;  recurrence/anemia with thrombocytopenia-  patient is symptomatic currently on ibrutinib 2 pill a day since feb 2017- clinical response noted.   #  Patient seems to be tolerating the pill daily well. Patient's white count is currently 20; hemoglobin 13 platelets 162. No untoward side effects noted from iburtinib. Unfortunately patient unable to get medication because of insurance reasons. Last medication given on February 28. Please iburtinib start as soon as possible  # Continue current therapy- follow-up with me in approximately 12 weeks with labs. Discussed with patient's son.     Cammie Sickle,  MD 03/15/2017 2:34 PM

## 2017-03-15 NOTE — Assessment & Plan Note (Signed)
#   CLL- 17p del;  recurrence/anemia with thrombocytopenia-  patient is symptomatic currently on ibrutinib 2 pill a day since feb 2017- clinical response noted.   #  Patient seems to be tolerating the pill daily well. Patient's white count is currently 20; hemoglobin 13 platelets 162. No untoward side effects noted from iburtinib. Unfortunately patient unable to get medication because of insurance reasons. Last medication given on February 28. Please iburtinib start as soon as possible  # Continue current therapy- follow-up with me in approximately 12 weeks with labs. Discussed with patient's son.

## 2017-03-15 NOTE — Progress Notes (Signed)
Here for follow up. Has not been taking meds for leukemia x 2 wks-per Wynetta Emery and Wynetta Emery refd to continue meds since pt living at [facility] health care. Per son pt doing very well. This Probation officer called [place] health care -spoke w Whitney who gave medication info and weight as 124.5 on 03/08/17/C [employer] RN

## 2017-03-24 ENCOUNTER — Telehealth: Payer: Self-pay | Admitting: *Deleted

## 2017-03-24 NOTE — Telephone Encounter (Signed)
Contacted Rhonda at [facility] care for updates for pt's care on finding a resource for [employer]. Per [facility] has been unable to find a dispensing pharmacy that their agency can contract with. She stated that she doesn't know who to talk to obtain the drug. She states that the son previously brought the out side medication that was dispensed/shipped to the patient's residence to the facility to dispense to the pt. She states that "in the past, it has always been the patient/pt's family responsibility to obtain specialty drugs." I explained to her that the pt is no longer able to do this and it' [facility]'s responsibility to obtain the drug from a specialty pharmacy. I provided her you I support programs phone number at [phone] to call for resources on dispensing pharmacies for imbruvica.

## 2017-04-06 ENCOUNTER — Telehealth: Payer: Self-pay | Admitting: *Deleted

## 2017-04-06 NOTE — Telephone Encounter (Signed)
Per Suanne Marker - found a pharmacy that [facility] can use to dispense imbruvica. [place] states they are attempting a prior auth at this time and will call our office back should they need further assistance.

## 2017-04-06 NOTE — Telephone Encounter (Signed)
Still in process of getting pt Imbruvica- see rn phone note on 04/06/17

## 2017-04-13 ENCOUNTER — Telehealth: Payer: Self-pay | Admitting: *Deleted

## 2017-04-13 NOTE — Telephone Encounter (Signed)
Rhonda - Director of Nursing called and left vm on clinical line requesting for a return phone call for pt: Wesley Mcguire.  Call returned. Had to leave vm for a return phone call.  [place]

## 2017-04-14 NOTE — Telephone Encounter (Signed)
Wesley Mcguire at [facility] - left vm at 11 am requesting return phone call.

## 2017-06-21 ENCOUNTER — Inpatient Hospital Stay: Payer: Medicare Other | Admitting: Internal Medicine

## 2017-06-21 ENCOUNTER — Inpatient Hospital Stay: Payer: Medicare Other

## 2017-06-21 ENCOUNTER — Other Ambulatory Visit: Payer: Medicare Other

## 2017-06-21 ENCOUNTER — Ambulatory Visit: Payer: Medicare Other | Admitting: Internal Medicine

## 2017-06-21 NOTE — Progress Notes (Deleted)
[facility] OFFICE PROGRESS NOTE  Patient Care Team: System, Pcp Not In as PCP - General   SUMMARY OF ONCOLOGIC HISTORY:  Oncology History   # [name] s/p Rituxan; NOV 2016- Recurrence; DEC 2016-CT A/P-- Bulky confluent retroperitoneal, mesenteric and bilateral pelvic lymphadenopathy, mildly increased since 2012]. 2017 JAN- Infusion reaction to Rituxan [stopped];   # FEB 2017- IBRUTINIB 1 pill a day; FISH- POSITIVE FOR HOMOZYGOUS 13Q DELETION;  POSITIVE FOR 17P GENE DELETION  # Poorly controlled DM/ cachexia/Left humerus fracture s/p fall [dec 2016]; pt in hospice [2016]     CLL (chronic lymphocytic leukemia) ([place])     INTERVAL HISTORY: Poor/vague historian-  Accompanied by his son.   83 year old male patient frail/multiple comorbidities with above history of recurrent CLL- currently rehabilitation is on Ibrutinib 2 pill a day for since feb 2017. Ibrutinib were stopped approximately month ago [Feb 28th] because of insurance reasons  Patient continues to live in the nursing home; patient is gaining weight-most recently 124 pounds.Marland Kitchen He is more coherent. Unfortunately patient does not get around a whole lot. He is worried about falls.    Patient denies diarrhea denies any lightheadedness or syncopal episodes.  Otherwise no fever no chills. No nausea no vomiting. No palpitations or chest pain.  REVIEW OF SYSTEMS:  A complete 10 point review of system is done which is negative except mentioned above/history of present illness.   PAST MEDICAL HISTORY :  Past Medical History:  Diagnosis Date  . Anemia   . CLL (chronic lymphocytic leukemia) ([place])   . Diabetes mellitus without complication ([place])   . Humerus fracture   . Leukemia ([place])   . Neuromuscular disorder ([place])    muscle weakness    PAST SURGICAL HISTORY :  No past surgical history on file.  FAMILY HISTORY :   Family History  Problem Relation Age of Onset  . Cancer Sister     SOCIAL HISTORY:   Social  History  Substance Use Topics  . Smoking status: Never Smoker  . Smokeless tobacco: Never Used  . Alcohol use No    ALLERGIES:  has No Known Allergies.  MEDICATIONS:  Current Outpatient Prescriptions  Medication Sig Dispense Refill  . aluminum-magnesium hydroxide-simethicone (MAALOX) 537-482-70 MG/5ML SUSP Take 30 mLs by mouth every 6 (six) hours as needed (for indigestion/heartburn).    . bisacodyl (DULCOLAX) 5 MG EC tablet Take 10 mg by mouth daily as needed for moderate constipation.    . feeding supplement, GLUCERNA SHAKE, (GLUCERNA SHAKE) LIQD Take 237 mLs by mouth 4 (four) times daily -  with meals and at bedtime.    Marland Kitchen glucagon (GLUCAGEN) 1 MG SOLR injection Inject 1 mg into the vein once as needed for low blood sugar.    . hydrocortisone cream 1 % Apply 1 application topically every 8 (eight) hours as needed for itching. Pt applies to posterior shoulders.    . ibrutinib (IMBRUVICA) 140 MG capsul Take 2 capsules (280 mg total) by mouth daily. Administer orally with water at approximately the same time every day (Patient not taking: Reported on 03/15/2017) 60 capsule 3  . insulin aspart (NOVOLOG) 100 UNIT/ML injection Inject 0-12 Units into the skin 2 (two) times daily. Pt uses per sliding scale:    0-150:  0 units  151-200:  4 units  201-250:  6 units  251-300:  8 units  301-350:  10 units 351-400:  12 units  Greater than 400:  Call MD    . insulin  detemir (LEVEMIR) 100 UNIT/ML injection Inject 15 Units into the skin at bedtime.    . Multiple Vitamin (MULTIVITAMIN) tablet Take 1 tablet by mouth daily.    . nitroGLYCERIN (NITROSTAT) 0.4 MG SL tablet Place 0.4 mg under the tongue every 5 (five) minutes x 3 doses as needed for chest pain.     Marland Kitchen NOVOLOG FLEXPEN 100 UNIT/ML FlexPen     . traMADol (ULTRAM) 50 MG tablet Take 1 tablet (50 mg total) by mouth every 6 (six) hours as needed for moderate pain. (Patient not taking: Reported on 03/15/2017) 30 tablet 0   No current  facility-administered medications for this visit.     PHYSICAL EXAMINATION: ECOG PERFORMANCE STATUS: 3 - Symptomatic, >50% confined to bed  There were no vitals taken for this visit.  GENERAL: Cachectic appearing Caucasian male patient Alert, no distress and comfortable.   He is in a wheelchair. He is accompanied by his son. Marland Kitchen EYES: no pallor or icterus;  OROPHARYNX: no thrush or ulceration;poor dentition. Marland Kitchen NECK: supple, no masses felt LYMPH: positive for palpable lymphadenopathy in the  bil axillary; inguinal regions cannot be examined LUNGS: clear to auscultation and  No wheeze or crackles HEART/CVS: regular rate & rhythm and no murmurs; No lower extremity edema ABDOMEN:abdomen soft, non-tender and normal bowel sounds Musculoskeletal:no cyanosis of digits and no clubbing; pt's left UE in sling [from humeral fracture]  PSYCH: alert & oriented x 3  NEURO: no focal motor/sensory deficits SKIN:  no rashes or significant lesions   LABORATORY DATA:  I have reviewed the data as listed    Component Value Date/Time   NA 132 (L) 03/15/2017 1340   NA 143 06/04/2012 1240   K 3.9 03/15/2017 1340   K 4.2 06/04/2012 1240   CL 99 (L) 03/15/2017 1340   CL 106 06/04/2012 1240   CO2 28 03/15/2017 1340   CO2 29 06/04/2012 1240   GLUCOSE 214 (H) 03/15/2017 1340   GLUCOSE 208 (H) 06/04/2012 1240   BUN 21 (H) 03/15/2017 1340   BUN 26 (H) 06/04/2012 1240   CREATININE 0.52 (L) 03/15/2017 1340   CREATININE 0.82 06/04/2012 1240   CALCIUM 8.8 (L) 03/15/2017 1340   CALCIUM 8.6 06/04/2012 1240   PROT 6.3 (L) 03/15/2017 1340   PROT 6.0 (L) 06/04/2012 1240   ALBUMIN 3.4 (L) 03/15/2017 1340   ALBUMIN 3.4 06/04/2012 1240   AST 27 03/15/2017 1340   AST 11 (L) 06/04/2012 1240   ALT 20 03/15/2017 1340   ALT 16 06/04/2012 1240   ALKPHOS 127 (H) 03/15/2017 1340   ALKPHOS 79 06/04/2012 1240   BILITOT 1.0 03/15/2017 1340   BILITOT 1.1 (H) 06/04/2012 1240   GFRNONAA >60 03/15/2017 1340   GFRNONAA >60  06/04/2012 1240   GFRAA >60 03/15/2017 1340   GFRAA >60 06/04/2012 1240    No results found for: SPEP, UPEP  Lab Results  Component Value Date   WBC 20.2 (H) 03/15/2017   NEUTROABS 3.2 03/15/2017   HGB 13.5 03/15/2017   HCT 41.8 03/15/2017   MCV 82.6 03/15/2017   PLT 162 03/15/2017      Chemistry      Component Value Date/Time   NA 132 (L) 03/15/2017 1340   NA 143 06/04/2012 1240   K 3.9 03/15/2017 1340   K 4.2 06/04/2012 1240   CL 99 (L) 03/15/2017 1340   CL 106 06/04/2012 1240   CO2 28 03/15/2017 1340   CO2 29 06/04/2012 1240   BUN 21 (H)  03/15/2017 1340   BUN 26 (H) 06/04/2012 1240   CREATININE 0.52 (L) 03/15/2017 1340   CREATININE 0.82 06/04/2012 1240      Component Value Date/Time   CALCIUM 8.8 (L) 03/15/2017 1340   CALCIUM 8.6 06/04/2012 1240   ALKPHOS 127 (H) 03/15/2017 1340   ALKPHOS 79 06/04/2012 1240   AST 27 03/15/2017 1340   AST 11 (L) 06/04/2012 1240   ALT 20 03/15/2017 1340   ALT 16 06/04/2012 1240   BILITOT 1.0 03/15/2017 1340   BILITOT 1.1 (H) 06/04/2012 1240        ASSESSMENT & PLAN:  No problem-specific Assessment & Plan notes found for this encounter.    Cammie Sickle, MD 06/21/2017 8:15 AM

## 2017-06-21 NOTE — Assessment & Plan Note (Deleted)
#   CLL- 17p del;  recurrence/anemia with thrombocytopenia-  patient is symptomatic currently on ibrutinib 2 pill a day since feb 2017- clinical response noted.   #  Patient seems to be tolerating the pill daily well. Patient's white count is currently 20; hemoglobin 13 platelets 162. No untoward side effects noted from iburtinib. Unfortunately patient unable to get medication because of insurance reasons. Last medication given on February 28. Please iburtinib start as soon as possible  # Continue current therapy- follow-up with me in approximately 12 weeks with labs. Discussed with patient's son.

## 2017-06-28 ENCOUNTER — Inpatient Hospital Stay: Payer: Medicare Other | Attending: Internal Medicine | Admitting: Internal Medicine

## 2017-06-28 ENCOUNTER — Inpatient Hospital Stay: Payer: Medicare Other

## 2017-06-28 VITALS — BP 104/61 | HR 61 | Temp 96.9°F | Resp 16

## 2017-06-28 DIAGNOSIS — D649 Anemia, unspecified: Secondary | ICD-10-CM | POA: Diagnosis not present

## 2017-06-28 DIAGNOSIS — R748 Abnormal levels of other serum enzymes: Secondary | ICD-10-CM

## 2017-06-28 DIAGNOSIS — R39198 Other difficulties with micturition: Secondary | ICD-10-CM | POA: Insufficient documentation

## 2017-06-28 DIAGNOSIS — Z794 Long term (current) use of insulin: Secondary | ICD-10-CM | POA: Diagnosis not present

## 2017-06-28 DIAGNOSIS — C9110 Chronic lymphocytic leukemia of B-cell type not having achieved remission: Secondary | ICD-10-CM | POA: Insufficient documentation

## 2017-06-28 DIAGNOSIS — N400 Enlarged prostate without lower urinary tract symptoms: Secondary | ICD-10-CM | POA: Insufficient documentation

## 2017-06-28 DIAGNOSIS — R64 Cachexia: Secondary | ICD-10-CM | POA: Insufficient documentation

## 2017-06-28 DIAGNOSIS — E119 Type 2 diabetes mellitus without complications: Secondary | ICD-10-CM | POA: Diagnosis not present

## 2017-06-28 DIAGNOSIS — R54 Age-related physical debility: Secondary | ICD-10-CM | POA: Insufficient documentation

## 2017-06-28 DIAGNOSIS — Z888 Allergy status to other drugs, medicaments and biological substances status: Secondary | ICD-10-CM | POA: Diagnosis not present

## 2017-06-28 DIAGNOSIS — Z809 Family history of malignant neoplasm, unspecified: Secondary | ICD-10-CM | POA: Diagnosis not present

## 2017-06-28 DIAGNOSIS — Z9221 Personal history of antineoplastic chemotherapy: Secondary | ICD-10-CM | POA: Insufficient documentation

## 2017-06-28 DIAGNOSIS — D696 Thrombocytopenia, unspecified: Secondary | ICD-10-CM | POA: Diagnosis not present

## 2017-06-28 DIAGNOSIS — Z79899 Other long term (current) drug therapy: Secondary | ICD-10-CM | POA: Insufficient documentation

## 2017-06-28 LAB — CBC WITH DIFFERENTIAL/PLATELET
BASOS ABS: 0.1 10*3/uL (ref 0–0.1)
BASOS PCT: 0 %
Eosinophils Absolute: 0.4 10*3/uL (ref 0–0.7)
Eosinophils Relative: 1 %
HEMATOCRIT: 42.4 % (ref 40.0–52.0)
Hemoglobin: 13.7 g/dL (ref 13.0–18.0)
LYMPHS PCT: 87 %
Lymphs Abs: 30.5 10*3/uL — ABNORMAL HIGH (ref 1.0–3.6)
MCH: 26.5 pg (ref 26.0–34.0)
MCHC: 32.4 g/dL (ref 32.0–36.0)
MCV: 81.8 fL (ref 80.0–100.0)
Monocytes Absolute: 0.9 10*3/uL (ref 0.2–1.0)
Monocytes Relative: 3 %
NEUTROS ABS: 3.2 10*3/uL (ref 1.4–6.5)
NEUTROS PCT: 9 %
Platelets: 150 10*3/uL (ref 150–440)
RBC: 5.18 MIL/uL (ref 4.40–5.90)
RDW: 15.7 % — AB (ref 11.5–14.5)
WBC: 35.1 10*3/uL — ABNORMAL HIGH (ref 3.8–10.6)

## 2017-06-28 LAB — COMPREHENSIVE METABOLIC PANEL
ALBUMIN: 3.2 g/dL — AB (ref 3.5–5.0)
ALK PHOS: 182 U/L — AB (ref 38–126)
ALT: 16 U/L — ABNORMAL LOW (ref 17–63)
ANION GAP: 4 — AB (ref 5–15)
AST: 22 U/L (ref 15–41)
BILIRUBIN TOTAL: 1.3 mg/dL — AB (ref 0.3–1.2)
BUN: 14 mg/dL (ref 6–20)
CALCIUM: 8.8 mg/dL — AB (ref 8.9–10.3)
CO2: 31 mmol/L (ref 22–32)
Chloride: 99 mmol/L — ABNORMAL LOW (ref 101–111)
Creatinine, Ser: 0.53 mg/dL — ABNORMAL LOW (ref 0.61–1.24)
GFR calc non Af Amer: >60 mL/min (ref >60)
Glucose, Bld: 129 mg/dL — ABNORMAL HIGH (ref 65–99)
POTASSIUM: 3.8 mmol/L (ref 3.5–5.1)
SODIUM: 134 mmol/L — AB (ref 135–145)
TOTAL PROTEIN: 6.7 g/dL (ref 6.5–8.1)

## 2017-06-28 LAB — LACTATE DEHYDROGENASE: LDH: 136 U/L (ref 98–192)

## 2017-06-28 MED ORDER — IBRUTINIB 280 MG PO TABS: 280.0000 mg | ORAL_TABLET | Freq: Every day | ORAL | 11 refills | Status: DC

## 2017-06-28 NOTE — Progress Notes (Signed)
Patient here today for follow up.   

## 2017-06-28 NOTE — Progress Notes (Signed)
[facility] OFFICE PROGRESS NOTE  Patient Care Team: System, Pcp Not In as PCP - General   SUMMARY OF ONCOLOGIC HISTORY:  Oncology History   # [name] s/p Rituxan; NOV 2016- Recurrence; DEC 2016-CT A/P-- Bulky confluent retroperitoneal, mesenteric and bilateral pelvic lymphadenopathy, mildly increased since 2012]. 2017 JAN- Infusion reaction to Rituxan [stopped];   # FEB 2017- IBRUTINIB 1 pill a day; FISH- POSITIVE FOR HOMOZYGOUS 13Q DELETION;  POSITIVE FOR 17P GENE DELETION  # Poorly controlled DM/ cachexia/Left humerus fracture s/p fall [dec 2016]; pt in hospice [2016]     CLL (chronic lymphocytic leukemia) ([place])     INTERVAL HISTORY: Poor/vague historian-  Accompanied by his son.   83 year old male patient frail/multiple comorbidities with above history of recurrent CLL- currently rehabilitation is on Ibrutinib 140mg  pill a day for since feb 2017. Ibrutinib Was restarted approximately 3 months ago after a hiatus because of insurance reasons.   Patient denies diarrhea denies any lightheadedness or syncopal episodes.  Otherwise no fever no chills. No nausea no vomiting. No palpitations or chest pain.  Patient continues to live in the nursing home; patient is gaining weight-most recently 124 pounds.Marland Kitchen He is more coherent. Unfortunately patient does not get around a whole lot.   REVIEW OF SYSTEMS:  A complete 10 point review of system is done which is negative except mentioned above/history of present illness.   PAST MEDICAL HISTORY :  Past Medical History:  Diagnosis Date  . Anemia   . CLL (chronic lymphocytic leukemia) ([place])   . Diabetes mellitus without complication ([place])   . Humerus fracture   . Leukemia ([place])   . Neuromuscular disorder ([place])    muscle weakness    PAST SURGICAL HISTORY :  No past surgical history on file.  FAMILY HISTORY :   Family History  Problem Relation Age of Onset  . Cancer Sister     SOCIAL HISTORY:   Social History   Substance Use Topics  . Smoking status: Never Smoker  . Smokeless tobacco: Never Used  . Alcohol use No    ALLERGIES:  has No Known Allergies.  MEDICATIONS:  Current Outpatient Prescriptions  Medication Sig Dispense Refill  . aluminum-magnesium hydroxide-simethicone (MAALOX) [phone] MG/5ML SUSP Take 30 mLs by mouth every 6 (six) hours as needed (for indigestion/heartburn).    . bisacodyl (DULCOLAX) 5 MG EC tablet Take 10 mg by mouth daily as needed for moderate constipation.    . feeding supplement, GLUCERNA SHAKE, (GLUCERNA SHAKE) LIQD Take 237 mLs by mouth 4 (four) times daily -  with meals and at bedtime.    Marland Kitchen glucagon (GLUCAGEN) 1 MG SOLR injection Inject 1 mg into the vein once as needed for low blood sugar.    . hydrocortisone cream 1 % Apply 1 application topically every 8 (eight) hours as needed for itching. Pt applies to posterior shoulders.    . insulin aspart (NOVOLOG) 100 UNIT/ML injection Inject 0-12 Units into the skin 2 (two) times daily. Pt uses per sliding scale:    0-150:  0 units  151-200:  4 units  201-250:  6 units  251-300:  8 units  301-350:  10 units 351-400:  12 units  Greater than 400:  Call MD    . insulin detemir (LEVEMIR) 100 UNIT/ML injection Inject 15 Units into the skin at bedtime.    . Multiple Vitamin (MULTIVITAMIN) tablet Take 1 tablet by mouth daily.    . nitroGLYCERIN (NITROSTAT) 0.4 MG SL tablet Place 0.4  mg under the tongue every 5 (five) minutes x 3 doses as needed for chest pain.     Marland Kitchen NOVOLOG FLEXPEN 100 UNIT/ML FlexPen     . traMADol (ULTRAM) 50 MG tablet Take 1 tablet (50 mg total) by mouth every 6 (six) hours as needed for moderate pain. 30 tablet 0  . Ibrutinib (IMBRUVICA) 280 MG TABS Take 280 mg by mouth daily. 30 tablet 11   No current facility-administered medications for this visit.     PHYSICAL EXAMINATION: ECOG PERFORMANCE STATUS: 3 - Symptomatic, >50% confined to bed  BP 104/61 (BP Location: Right Arm, Patient Position:  Sitting)   Pulse 61   Temp (!) 96.9 F (36.1 C) (Tympanic)   Resp 16   GENERAL: Cachectic appearing Caucasian male patient Alert, no distress and comfortable.   He is in a wheelchair. He is accompanied by his son. Marland Kitchen EYES: no pallor or icterus;  OROPHARYNX: no thrush or ulceration;poor dentition. Marland Kitchen NECK: supple, no masses felt LYMPH: positive for palpable lymphadenopathy in the  bil axillary; inguinal regions cannot be examined LUNGS: clear to auscultation and  No wheeze or crackles HEART/CVS: regular rate & rhythm and no murmurs; No lower extremity edema ABDOMEN:abdomen soft, non-tender and normal bowel sounds Musculoskeletal:no cyanosis of digits and no clubbing; pt's left UE in sling [from humeral fracture]  PSYCH: alert & oriented x 3  NEURO: no focal motor/sensory deficits SKIN:  no rashes or significant lesions   LABORATORY DATA:  I have reviewed the data as listed    Component Value Date/Time   NA 134 (L) 06/28/2017 0901   NA 143 06/04/2012 1240   K 3.8 06/28/2017 0901   K 4.2 06/04/2012 1240   CL 99 (L) 06/28/2017 0901   CL 106 06/04/2012 1240   CO2 31 06/28/2017 0901   CO2 29 06/04/2012 1240   GLUCOSE 129 (H) 06/28/2017 0901   GLUCOSE 208 (H) 06/04/2012 1240   BUN 14 06/28/2017 0901   BUN 26 (H) 06/04/2012 1240   CREATININE 0.53 (L) 06/28/2017 0901   CREATININE 0.82 06/04/2012 1240   CALCIUM 8.8 (L) 06/28/2017 0901   CALCIUM 8.6 06/04/2012 1240   PROT 6.7 06/28/2017 0901   PROT 6.0 (L) 06/04/2012 1240   ALBUMIN 3.2 (L) 06/28/2017 0901   ALBUMIN 3.4 06/04/2012 1240   AST 22 06/28/2017 0901   AST 11 (L) 06/04/2012 1240   ALT 16 (L) 06/28/2017 0901   ALT 16 06/04/2012 1240   ALKPHOS 182 (H) 06/28/2017 0901   ALKPHOS 79 06/04/2012 1240   BILITOT 1.3 (H) 06/28/2017 0901   BILITOT 1.1 (H) 06/04/2012 1240   GFRNONAA >60 06/28/2017 0901   GFRNONAA >60 06/04/2012 1240   GFRAA >60 06/28/2017 0901   GFRAA >60 06/04/2012 1240    No results found for: SPEP,  UPEP  Lab Results  Component Value Date   WBC 35.1 (H) 06/28/2017   NEUTROABS 3.2 06/28/2017   HGB 13.7 06/28/2017   HCT 42.4 06/28/2017   MCV 81.8 06/28/2017   PLT 150 06/28/2017      Chemistry      Component Value Date/Time   NA 134 (L) 06/28/2017 0901   NA 143 06/04/2012 1240   K 3.8 06/28/2017 0901   K 4.2 06/04/2012 1240   CL 99 (L) 06/28/2017 0901   CL 106 06/04/2012 1240   CO2 31 06/28/2017 0901   CO2 29 06/04/2012 1240   BUN 14 06/28/2017 0901   BUN 26 (H) 06/04/2012 1240  CREATININE 0.53 (L) 06/28/2017 0901   CREATININE 0.82 06/04/2012 1240      Component Value Date/Time   CALCIUM 8.8 (L) 06/28/2017 0901   CALCIUM 8.6 06/04/2012 1240   ALKPHOS 182 (H) 06/28/2017 0901   ALKPHOS 79 06/04/2012 1240   AST 22 06/28/2017 0901   AST 11 (L) 06/04/2012 1240   ALT 16 (L) 06/28/2017 0901   ALT 16 06/04/2012 1240   BILITOT 1.3 (H) 06/28/2017 0901   BILITOT 1.1 (H) 06/04/2012 1240        ASSESSMENT & PLAN:  CLL (chronic lymphocytic leukemia) ([place]) # CLL- 17p del;  recurrence/anemia with thrombocytopenia-  patient is symptomatic currently on ibrutinib since feb 2017- clinical response noted.   # however, multiple interruptions given the difficulty in acquiring the medication at the nursing home. Most recently on 140 mg a day. However today white count is 30,000 [ALC- 15]; normal hemoglobin/platelets.  # It's unclear if the elevated lymphocytosis is secondary to recently started Ibrutinib vs- true progression of disease. Since patient has tolerated the treatment fairly well- I would recommend increasing the dose to 280 milligrams a day. New prescription given. Patient will finish of Ibrutinib 140 mg a day until August 2nd; we will start 280 mg prescription around August 3rd.   # Elevated alkaline phosphatase- question Ibrutinib. Vs others. Check PSA at next visit.   # Follow-up with me in approximately 4 weeks  with labs. Discussed with patient's son.     Cammie Sickle, MD 06/28/2017 4:17 PM

## 2017-06-28 NOTE — Assessment & Plan Note (Addendum)
#   CLL- 17p del;  recurrence/anemia with thrombocytopenia-  patient is symptomatic currently on ibrutinib since feb 2017- clinical response noted.   # however, multiple interruptions given the difficulty in acquiring the medication at the nursing home. Most recently on 140 mg a day. However today white count is 30,000 [ALC- 15]; normal hemoglobin/platelets.  # It's unclear if the elevated lymphocytosis is secondary to recently started Ibrutinib vs- true progression of disease. Since patient has tolerated the treatment fairly well- I would recommend increasing the dose to 280 milligrams a day. New prescription given. Patient will finish of Ibrutinib 140 mg a day until August 2nd; we will start 280 mg prescription around August 3rd.   # Elevated alkaline phosphatase- question Ibrutinib. Vs others. Check PSA at next visit.   # Follow-up with me in approximately 4 weeks  with labs. Discussed with patient's son.

## 2017-07-25 NOTE — Assessment & Plan Note (Addendum)
#   CLL- 17p del- patient is symptomatic currently on ibrutinib since feb 2017- clinical response noted.  However, white count 25- 30,000 [ALC- 15]/slightly rising; normal hemoglobin/platelets.  Will increase the dose of ibrutinib to 280 milligrams a day.   # Elevated alkaline phosphatase- question Ibrutinib. Vs others. PSA pending.  # Follow-up with me in approximately 4 weeks  with labs. Discussed with patient's daughter.

## 2017-07-25 NOTE — Progress Notes (Signed)
[facility] OFFICE PROGRESS NOTE  Patient Care Team: System, Pcp Not In as PCP - General   SUMMARY OF ONCOLOGIC HISTORY:  Oncology History   # [name] s/p Rituxan; NOV 2016- Recurrence; DEC 2016-CT A/P-- Bulky confluent retroperitoneal, mesenteric and bilateral pelvic lymphadenopathy, mildly increased since 2012]. 2017 JAN- Infusion reaction to Rituxan [stopped];   # FEB 2017- IBRUTINIB 1 pill a day; FISH- POSITIVE FOR HOMOZYGOUS 13Q DELETION;  POSITIVE FOR 17P GENE DELETION  # Poorly controlled DM/ cachexia/Left humerus fracture s/p fall [dec 2016]; pt in hospice [2016]     CLL (chronic lymphocytic leukemia) ([place])     INTERVAL HISTORY: Poor/vague historian-  Accompanied by his Daughter   83 year old male patient frail/multiple comorbidities with above history of recurrent CLL- currently rehabilitation is on Ibrutinib 140mg  pill a day for since feb 2017. Patient had multiple interruptions however has been consistently on it for the last 2 months.   No bruising noted. No falls. Patient denies diarrhea denies any lightheadedness or syncopal episodes.  Otherwise no fever no chills. No nausea no vomiting. No palpitations or chest pain.  Patient continues to live in the nursing home. He has had limited activity.  REVIEW OF SYSTEMS:  A complete 10 point review of system is done which is negative except mentioned above/history of present illness.   PAST MEDICAL HISTORY :  Past Medical History:  Diagnosis Date  . Anemia   . CLL (chronic lymphocytic leukemia) ([place])   . Diabetes mellitus without complication ([place])   . Humerus fracture   . Leukemia ([place])   . Neuromuscular disorder ([place])    muscle weakness    PAST SURGICAL HISTORY :  No past surgical history on file.  FAMILY HISTORY :   Family History  Problem Relation Age of Onset  . Cancer Sister     SOCIAL HISTORY:   Social History  Substance Use Topics  . Smoking status: Never Smoker  . Smokeless  tobacco: Never Used  . Alcohol use No    ALLERGIES:  has No Known Allergies.  MEDICATIONS:  Current Outpatient Prescriptions  Medication Sig Dispense Refill  . aluminum-magnesium hydroxide-simethicone (MAALOX) 160-737-10 MG/5ML SUSP Take 30 mLs by mouth every 6 (six) hours as needed (for indigestion/heartburn).    . feeding supplement, GLUCERNA SHAKE, (GLUCERNA SHAKE) LIQD Take 237 mLs by mouth 4 (four) times daily -  with meals and at bedtime.    . IMBRUVICA 140 MG tablet Take 1 tablet by mouth daily.    . insulin aspart (NOVOLOG) 100 UNIT/ML injection Inject 0-12 Units into the skin 2 (two) times daily. Pt uses per sliding scale:    0-150:  0 units  151-200:  4 units  201-250:  6 units  251-300:  8 units  301-350:  10 units 351-400:  12 units  Greater than 400:  Call MD    . insulin detemir (LEVEMIR) 100 UNIT/ML injection Inject 10 Units into the skin at bedtime.     . Multiple Vitamin (MULTIVITAMIN) tablet Take 1 tablet by mouth daily.    . fluticasone (FLONASE) 50 MCG/ACT nasal spray Place 1 spray into both nostrils daily as needed for allergies.    Marland Kitchen glucagon (GLUCAGEN) 1 MG SOLR injection Inject 1 mg into the vein once as needed for low blood sugar.    . Ibrutinib (IMBRUVICA) 280 MG TABS Take 280 mg by mouth daily. (Patient not taking: Reported on 07/26/2017) 30 tablet 11  . nitroGLYCERIN (NITROSTAT) 0.4 MG SL tablet  Place 0.4 mg under the tongue every 5 (five) minutes x 3 doses as needed for chest pain.      No current facility-administered medications for this visit.     PHYSICAL EXAMINATION: ECOG PERFORMANCE STATUS: 3 - Symptomatic, >50% confined to bed  BP (!) 118/92   Pulse 90   Temp 97.9 F (36.6 C) (Tympanic)   Resp 20   GENERAL: Cachectic appearing Caucasian male patient Alert, no distress and comfortable.   He is in a wheelchair. He is accompanied by hisDaughter. EYES: no pallor or icterus;  OROPHARYNX: no thrush or ulceration;poor dentition. Marland Kitchen NECK: supple, no  masses felt LYMPH: positive for palpable lymphadenopathy in the  bil axillary; inguinal regions cannot be examined LUNGS: clear to auscultation and  No wheeze or crackles HEART/CVS: regular rate & rhythm and no murmurs; No lower extremity edema ABDOMEN:abdomen soft, non-tender and normal bowel sounds Musculoskeletal:no cyanosis of digits and no clubbing.  PSYCH: alert & oriented x 3  NEURO: no focal motor/sensory deficits SKIN:  no rashes or significant lesions   LABORATORY DATA:  I have reviewed the data as listed    Component Value Date/Time   NA 133 (L) 07/26/2017 1117   NA 143 06/04/2012 1240   K 4.3 07/26/2017 1117   K 4.2 06/04/2012 1240   CL 98 (L) 07/26/2017 1117   CL 106 06/04/2012 1240   CO2 30 07/26/2017 1117   CO2 29 06/04/2012 1240   GLUCOSE 264 (H) 07/26/2017 1117   GLUCOSE 208 (H) 06/04/2012 1240   BUN 17 07/26/2017 1117   BUN 26 (H) 06/04/2012 1240   CREATININE 0.50 (L) 07/26/2017 1117   CREATININE 0.82 06/04/2012 1240   CALCIUM 8.5 (L) 07/26/2017 1117   CALCIUM 8.6 06/04/2012 1240   PROT 6.2 (L) 07/26/2017 1117   PROT 6.0 (L) 06/04/2012 1240   ALBUMIN 3.2 (L) 07/26/2017 1117   ALBUMIN 3.4 06/04/2012 1240   AST 21 07/26/2017 1117   AST 11 (L) 06/04/2012 1240   ALT 16 (L) 07/26/2017 1117   ALT 16 06/04/2012 1240   ALKPHOS 143 (H) 07/26/2017 1117   ALKPHOS 79 06/04/2012 1240   BILITOT 0.8 07/26/2017 1117   BILITOT 1.1 (H) 06/04/2012 1240   GFRNONAA >60 07/26/2017 1117   GFRNONAA >60 06/04/2012 1240   GFRAA >60 07/26/2017 1117   GFRAA >60 06/04/2012 1240    No results found for: SPEP, UPEP  Lab Results  Component Value Date   WBC 25.1 (H) 07/26/2017   NEUTROABS 4.0 07/26/2017   HGB 12.9 (L) 07/26/2017   HCT 40.2 07/26/2017   MCV 82.1 07/26/2017   PLT 163 07/26/2017      Chemistry      Component Value Date/Time   NA 133 (L) 07/26/2017 1117   NA 143 06/04/2012 1240   K 4.3 07/26/2017 1117   K 4.2 06/04/2012 1240   CL 98 (L) 07/26/2017  1117   CL 106 06/04/2012 1240   CO2 30 07/26/2017 1117   CO2 29 06/04/2012 1240   BUN 17 07/26/2017 1117   BUN 26 (H) 06/04/2012 1240   CREATININE 0.50 (L) 07/26/2017 1117   CREATININE 0.82 06/04/2012 1240      Component Value Date/Time   CALCIUM 8.5 (L) 07/26/2017 1117   CALCIUM 8.6 06/04/2012 1240   ALKPHOS 143 (H) 07/26/2017 1117   ALKPHOS 79 06/04/2012 1240   AST 21 07/26/2017 1117   AST 11 (L) 06/04/2012 1240   ALT 16 (L) 07/26/2017 1117  ALT 16 06/04/2012 1240   BILITOT 0.8 07/26/2017 1117   BILITOT 1.1 (H) 06/04/2012 1240        ASSESSMENT & PLAN:  CLL (chronic lymphocytic leukemia) ([place]) # CLL- 17p del- patient is symptomatic currently on ibrutinib since feb 2017- clinical response noted.  However, white count 25- 30,000 [ALC- 15]/slightly rising; normal hemoglobin/platelets.  Will increase the dose of ibrutinib to 280 milligrams a day.   # Elevated alkaline phosphatase- question Ibrutinib. Vs others. PSA pending.  # Follow-up with me in approximately 4 weeks  with labs. Discussed with patient's daughter.     Cammie Sickle, MD 07/26/2017 1:08 PM

## 2017-07-26 ENCOUNTER — Inpatient Hospital Stay: Payer: Medicare Other | Attending: Internal Medicine | Admitting: Internal Medicine

## 2017-07-26 ENCOUNTER — Inpatient Hospital Stay: Payer: Medicare Other

## 2017-07-26 VITALS — BP 118/92 | HR 90 | Temp 97.9°F | Resp 20

## 2017-07-26 DIAGNOSIS — Z79899 Other long term (current) drug therapy: Secondary | ICD-10-CM | POA: Diagnosis not present

## 2017-07-26 DIAGNOSIS — R634 Abnormal weight loss: Secondary | ICD-10-CM

## 2017-07-26 DIAGNOSIS — R64 Cachexia: Secondary | ICD-10-CM | POA: Diagnosis not present

## 2017-07-26 DIAGNOSIS — Z9221 Personal history of antineoplastic chemotherapy: Secondary | ICD-10-CM | POA: Diagnosis not present

## 2017-07-26 DIAGNOSIS — Z794 Long term (current) use of insulin: Secondary | ICD-10-CM | POA: Insufficient documentation

## 2017-07-26 DIAGNOSIS — C9110 Chronic lymphocytic leukemia of B-cell type not having achieved remission: Secondary | ICD-10-CM | POA: Diagnosis not present

## 2017-07-26 DIAGNOSIS — R748 Abnormal levels of other serum enzymes: Secondary | ICD-10-CM | POA: Diagnosis not present

## 2017-07-26 DIAGNOSIS — K0889 Other specified disorders of teeth and supporting structures: Secondary | ICD-10-CM

## 2017-07-26 DIAGNOSIS — N400 Enlarged prostate without lower urinary tract symptoms: Secondary | ICD-10-CM

## 2017-07-26 DIAGNOSIS — E119 Type 2 diabetes mellitus without complications: Secondary | ICD-10-CM | POA: Diagnosis not present

## 2017-07-26 LAB — COMPREHENSIVE METABOLIC PANEL
ALK PHOS: 143 U/L — AB (ref 38–126)
ALT: 16 U/L — ABNORMAL LOW (ref 17–63)
ANION GAP: 5 (ref 5–15)
AST: 21 U/L (ref 15–41)
Albumin: 3.2 g/dL — ABNORMAL LOW (ref 3.5–5.0)
BILIRUBIN TOTAL: 0.8 mg/dL (ref 0.3–1.2)
BUN: 17 mg/dL (ref 6–20)
CALCIUM: 8.5 mg/dL — AB (ref 8.9–10.3)
CO2: 30 mmol/L (ref 22–32)
Chloride: 98 mmol/L — ABNORMAL LOW (ref 101–111)
Creatinine, Ser: 0.5 mg/dL — ABNORMAL LOW (ref 0.61–1.24)
GFR calc Af Amer: >60 mL/min (ref >60)
Glucose, Bld: 264 mg/dL — ABNORMAL HIGH (ref 65–99)
POTASSIUM: 4.3 mmol/L (ref 3.5–5.1)
Sodium: 133 mmol/L — ABNORMAL LOW (ref 135–145)
TOTAL PROTEIN: 6.2 g/dL — AB (ref 6.5–8.1)

## 2017-07-26 LAB — CBC WITH DIFFERENTIAL/PLATELET
Basophils Absolute: 0.1 10*3/uL (ref 0–0.1)
Basophils Relative: 0 %
Eosinophils Absolute: 0.3 10*3/uL (ref 0–0.7)
Eosinophils Relative: 1 %
HEMATOCRIT: 40.2 % (ref 40.0–52.0)
Hemoglobin: 12.9 g/dL — ABNORMAL LOW (ref 13.0–18.0)
LYMPHS ABS: 20 10*3/uL — AB (ref 1.0–3.6)
LYMPHS PCT: 80 %
MCH: 26.3 pg (ref 26.0–34.0)
MCHC: 32 g/dL (ref 32.0–36.0)
MCV: 82.1 fL (ref 80.0–100.0)
MONO ABS: 0.8 10*3/uL (ref 0.2–1.0)
MONOS PCT: 3 %
NEUTROS ABS: 4 10*3/uL (ref 1.4–6.5)
Neutrophils Relative %: 16 %
Platelets: 163 10*3/uL (ref 150–440)
RBC: 4.9 MIL/uL (ref 4.40–5.90)
RDW: 15.2 % — AB (ref 11.5–14.5)
WBC: 25.1 10*3/uL — ABNORMAL HIGH (ref 3.8–10.6)

## 2017-07-26 LAB — LACTATE DEHYDROGENASE: LDH: 129 U/L (ref 98–192)

## 2017-07-26 LAB — PSA: Prostatic Specific Antigen: 0.63 ng/mL (ref 0.00–4.00)

## 2017-07-26 NOTE — Progress Notes (Signed)
Currently taking 140 mg dosing of Imbruvica until 07/29/17

## 2017-08-03 ENCOUNTER — Other Ambulatory Visit: Payer: Self-pay | Admitting: *Deleted

## 2017-08-03 DIAGNOSIS — C9110 Chronic lymphocytic leukemia of B-cell type not having achieved remission: Secondary | ICD-10-CM

## 2017-08-03 MED ORDER — IBRUTINIB 280 MG PO TABS: 280.0000 mg | ORAL_TABLET | Freq: Every day | ORAL | 11 refills | Status: DC

## 2017-08-03 NOTE — Progress Notes (Signed)
[facility] informed [facility] that Imbruica dosing has changed to 280 mg. Needs new rx faxed to [facility] to confirm this new dosing.

## 2017-08-23 ENCOUNTER — Inpatient Hospital Stay: Payer: Medicare Other

## 2017-08-23 ENCOUNTER — Inpatient Hospital Stay: Payer: Medicare Other | Attending: Internal Medicine | Admitting: Internal Medicine

## 2017-08-23 VITALS — BP 115/78 | HR 87 | Temp 97.8°F | Resp 20

## 2017-08-23 DIAGNOSIS — Z809 Family history of malignant neoplasm, unspecified: Secondary | ICD-10-CM | POA: Insufficient documentation

## 2017-08-23 DIAGNOSIS — E119 Type 2 diabetes mellitus without complications: Secondary | ICD-10-CM | POA: Insufficient documentation

## 2017-08-23 DIAGNOSIS — Z79899 Other long term (current) drug therapy: Secondary | ICD-10-CM | POA: Diagnosis not present

## 2017-08-23 DIAGNOSIS — Z794 Long term (current) use of insulin: Secondary | ICD-10-CM | POA: Diagnosis not present

## 2017-08-23 DIAGNOSIS — R748 Abnormal levels of other serum enzymes: Secondary | ICD-10-CM | POA: Diagnosis not present

## 2017-08-23 DIAGNOSIS — Z9221 Personal history of antineoplastic chemotherapy: Secondary | ICD-10-CM | POA: Insufficient documentation

## 2017-08-23 DIAGNOSIS — C9110 Chronic lymphocytic leukemia of B-cell type not having achieved remission: Secondary | ICD-10-CM | POA: Diagnosis not present

## 2017-08-23 LAB — COMPREHENSIVE METABOLIC PANEL
ALBUMIN: 3.6 g/dL (ref 3.5–5.0)
ALT: 18 U/L (ref 17–63)
ANION GAP: 7 (ref 5–15)
AST: 24 U/L (ref 15–41)
Alkaline Phosphatase: 148 U/L — ABNORMAL HIGH (ref 38–126)
BILIRUBIN TOTAL: 0.8 mg/dL (ref 0.3–1.2)
BUN: 18 mg/dL (ref 6–20)
CO2: 30 mmol/L (ref 22–32)
Calcium: 8.9 mg/dL (ref 8.9–10.3)
Chloride: 97 mmol/L — ABNORMAL LOW (ref 101–111)
Creatinine, Ser: 0.51 mg/dL — ABNORMAL LOW (ref 0.61–1.24)
GFR calc Af Amer: >60 mL/min (ref >60)
GFR calc non Af Amer: >60 mL/min (ref >60)
GLUCOSE: 176 mg/dL — AB (ref 65–99)
POTASSIUM: 4.4 mmol/L (ref 3.5–5.1)
SODIUM: 134 mmol/L — AB (ref 135–145)
TOTAL PROTEIN: 6.9 g/dL (ref 6.5–8.1)

## 2017-08-23 LAB — CBC WITH DIFFERENTIAL/PLATELET
BASOS PCT: 0 %
Basophils Absolute: 0.1 10*3/uL (ref 0–0.1)
EOS ABS: 0.1 10*3/uL (ref 0–0.7)
EOS PCT: 0 %
HEMATOCRIT: 41.2 % (ref 40.0–52.0)
Hemoglobin: 13.5 g/dL (ref 13.0–18.0)
Lymphocytes Relative: 77 %
Lymphs Abs: 22.8 10*3/uL — ABNORMAL HIGH (ref 1.0–3.6)
MCH: 26.4 pg (ref 26.0–34.0)
MCHC: 32.8 g/dL (ref 32.0–36.0)
MCV: 80.5 fL (ref 80.0–100.0)
MONO ABS: 1 10*3/uL (ref 0.2–1.0)
MONOS PCT: 3 %
Neutro Abs: 6.1 10*3/uL (ref 1.4–6.5)
Neutrophils Relative %: 20 %
Platelets: 173 10*3/uL (ref 150–440)
RBC: 5.11 MIL/uL (ref 4.40–5.90)
RDW: 15.1 % — AB (ref 11.5–14.5)
WBC: 30.1 10*3/uL — ABNORMAL HIGH (ref 3.8–10.6)

## 2017-08-23 LAB — LACTATE DEHYDROGENASE: LDH: 161 U/L (ref 98–192)

## 2017-08-23 MED ORDER — IBRUTINIB 420 MG PO TABS: 420.0000 mg | ORAL_TABLET | Freq: Every day | ORAL | 4 refills | Status: DC

## 2017-08-23 NOTE — Progress Notes (Signed)
[facility] OFFICE PROGRESS NOTE  Patient Care Team: System, Pcp Not In as PCP - General   SUMMARY OF ONCOLOGIC HISTORY:  Oncology History   # [name] s/p Rituxan; NOV 2016- Recurrence; DEC 2016-CT A/P-- Bulky confluent retroperitoneal, mesenteric and bilateral pelvic lymphadenopathy, mildly increased since 2012]. 2017 JAN- Infusion reaction to Rituxan [stopped];   # FEB 2017- IBRUTINIB 1 pill a day; FISH- POSITIVE FOR HOMOZYGOUS 13Q DELETION;  POSITIVE FOR 17P GENE DELETION  # Poorly controlled DM/ cachexia/Left humerus fracture s/p fall [dec 2016]; pt in hospice [2016]     CLL (chronic lymphocytic leukemia) ([place])     INTERVAL HISTORY: Poor/vague historian. He is alone today.   83 year old male patient frail/multiple comorbidities with above history of recurrent CLL- currently rehabilitation is on Ibrutinib 280 mg pill a day- Is here for follow-up.   No bruising noted. No falls. Patient denies diarrhea denies any lightheadedness or syncopal episodes.  Otherwise no fever no chills. No nausea no vomiting. No palpitations or chest pain.  Patient continues to live in the nursing home.  REVIEW OF SYSTEMS:  A complete 10 point review of system is done which is negative except mentioned above/history of present illness.   PAST MEDICAL HISTORY :  Past Medical History:  Diagnosis Date  . Anemia   . CLL (chronic lymphocytic leukemia) ([place])   . Diabetes mellitus without complication ([place])   . Humerus fracture   . Leukemia ([place])   . Neuromuscular disorder ([place])    muscle weakness    PAST SURGICAL HISTORY :  No past surgical history on file.  FAMILY HISTORY :   Family History  Problem Relation Age of Onset  . Cancer Sister     SOCIAL HISTORY:   Social History  Substance Use Topics  . Smoking status: Never Smoker  . Smokeless tobacco: Never Used  . Alcohol use No    ALLERGIES:  has No Known Allergies.  MEDICATIONS:  Current Outpatient Prescriptions   Medication Sig Dispense Refill  . feeding supplement, GLUCERNA SHAKE, (GLUCERNA SHAKE) LIQD Take 237 mLs by mouth 4 (four) times daily -  with meals and at bedtime.    . fluticasone (FLONASE) 50 MCG/ACT nasal spray Place 1 spray into both nostrils daily as needed for allergies.    Marland Kitchen insulin aspart (NOVOLOG) 100 UNIT/ML injection Inject 0-12 Units into the skin 2 (two) times daily. Pt uses per sliding scale:    0-150:  0 units  151-200:  4 units  201-250:  6 units  251-300:  8 units  301-350:  10 units 351-400:  12 units  Greater than 400:  Call MD    . insulin detemir (LEVEMIR) 100 UNIT/ML injection Inject 10 Units into the skin at bedtime.     . Multiple Vitamin (MULTIVITAMIN) tablet Take 1 tablet by mouth daily.    Marland Kitchen aluminum-magnesium hydroxide-simethicone (MAALOX) 562-130-86 MG/5ML SUSP Take 30 mLs by mouth every 6 (six) hours as needed (for indigestion/heartburn).    Marland Kitchen glucagon (GLUCAGEN) 1 MG SOLR injection Inject 1 mg into the vein once as needed for low blood sugar.    . Ibrutinib 420 MG TABS Take 420 mg by mouth daily. 30 tablet 4  . nitroGLYCERIN (NITROSTAT) 0.4 MG SL tablet Place 0.4 mg under the tongue every 5 (five) minutes x 3 doses as needed for chest pain.      No current facility-administered medications for this visit.     PHYSICAL EXAMINATION: ECOG PERFORMANCE STATUS: 3 -  Symptomatic, >50% confined to bed  BP 115/78   Pulse 87   Temp 97.8 F (36.6 C) (Tympanic)   Resp 20   GENERAL: Cachectic appearing Caucasian male patient Alert, no distress and comfortable.   He is in a wheelchair.  EYES: no pallor or icterus;  OROPHARYNX: no thrush or ulceration;poor dentition. Marland Kitchen NECK: supple, no masses felt LYMPH: positive for palpable lymphadenopathy in the  bil axillary; inguinal regions cannot be examined LUNGS: clear to auscultation and  No wheeze or crackles HEART/CVS: regular rate & rhythm and no murmurs; No lower extremity edema ABDOMEN:abdomen soft, non-tender  and normal bowel sounds Musculoskeletal:no cyanosis of digits and no clubbing.  PSYCH: alert & oriented x 3  NEURO: no focal motor/sensory deficits SKIN:  no rashes or significant lesions   LABORATORY DATA:  I have reviewed the data as listed    Component Value Date/Time   NA 134 (L) 08/23/2017 1037   NA 143 06/04/2012 1240   K 4.4 08/23/2017 1037   K 4.2 06/04/2012 1240   CL 97 (L) 08/23/2017 1037   CL 106 06/04/2012 1240   CO2 30 08/23/2017 1037   CO2 29 06/04/2012 1240   GLUCOSE 176 (H) 08/23/2017 1037   GLUCOSE 208 (H) 06/04/2012 1240   BUN 18 08/23/2017 1037   BUN 26 (H) 06/04/2012 1240   CREATININE 0.51 (L) 08/23/2017 1037   CREATININE 0.82 06/04/2012 1240   CALCIUM 8.9 08/23/2017 1037   CALCIUM 8.6 06/04/2012 1240   PROT 6.9 08/23/2017 1037   PROT 6.0 (L) 06/04/2012 1240   ALBUMIN 3.6 08/23/2017 1037   ALBUMIN 3.4 06/04/2012 1240   AST 24 08/23/2017 1037   AST 11 (L) 06/04/2012 1240   ALT 18 08/23/2017 1037   ALT 16 06/04/2012 1240   ALKPHOS 148 (H) 08/23/2017 1037   ALKPHOS 79 06/04/2012 1240   BILITOT 0.8 08/23/2017 1037   BILITOT 1.1 (H) 06/04/2012 1240   GFRNONAA >60 08/23/2017 1037   GFRNONAA >60 06/04/2012 1240   GFRAA >60 08/23/2017 1037   GFRAA >60 06/04/2012 1240    No results found for: SPEP, UPEP  Lab Results  Component Value Date   WBC 30.1 (H) 08/23/2017   NEUTROABS 6.1 08/23/2017   HGB 13.5 08/23/2017   HCT 41.2 08/23/2017   MCV 80.5 08/23/2017   PLT 173 08/23/2017      Chemistry      Component Value Date/Time   NA 134 (L) 08/23/2017 1037   NA 143 06/04/2012 1240   K 4.4 08/23/2017 1037   K 4.2 06/04/2012 1240   CL 97 (L) 08/23/2017 1037   CL 106 06/04/2012 1240   CO2 30 08/23/2017 1037   CO2 29 06/04/2012 1240   BUN 18 08/23/2017 1037   BUN 26 (H) 06/04/2012 1240   CREATININE 0.51 (L) 08/23/2017 1037   CREATININE 0.82 06/04/2012 1240      Component Value Date/Time   CALCIUM 8.9 08/23/2017 1037   CALCIUM 8.6 06/04/2012  1240   ALKPHOS 148 (H) 08/23/2017 1037   ALKPHOS 79 06/04/2012 1240   AST 24 08/23/2017 1037   AST 11 (L) 06/04/2012 1240   ALT 18 08/23/2017 1037   ALT 16 06/04/2012 1240   BILITOT 0.8 08/23/2017 1037   BILITOT 1.1 (H) 06/04/2012 1240        ASSESSMENT & PLAN:  CLL (chronic lymphocytic leukemia) ([place]) # CLL- 17p del- patient is symptomatic currently on ibrutinib since feb 2017- clinical response noted. Currently on ibrutinib 280  mg/day; tolerating well.  However, white count today 30,000 [ALC- 15] /slightly rising; normal hemoglobin/platelets. Recommend increasing the dose of ibrutinib to 420 mg/day. New scripts given/faex to [facility].   # If patient continues to progress on current therapy- the next line of therapy would be venetoclax+ Rituxan. We will check fish panel prior to next visit.   # Elevated alkaline phosphatase- question Ibrutinib- stable/improving. Recent PSA- normal.  # Follow-up with me in approximately 4 weeks  with labs; check FISH panel.    Cammie Sickle, MD 08/23/2017 12:15 PM

## 2017-08-23 NOTE — Progress Notes (Signed)
Patient currently on Imbruvica 280 mg per facility MAR.

## 2017-08-23 NOTE — Assessment & Plan Note (Addendum)
#   CLL- 17p del- patient is symptomatic currently on ibrutinib since feb 2017- clinical response noted. Currently on ibrutinib 280 mg/day; tolerating well.  However, white count today 30,000 [ALC- 15] /slightly rising; normal hemoglobin/platelets. Recommend increasing the dose of ibrutinib to 420 mg/day. New scripts given/faex to [facility].   # If patient continues to progress on current therapy- the next line of therapy would be venetoclax+ Rituxan. We will check fish panel prior to next visit.   # Elevated alkaline phosphatase- question Ibrutinib- stable/improving. Recent PSA- normal.  # Follow-up with me in approximately 4 weeks  with labs; check FISH panel.

## 2017-08-24 ENCOUNTER — Telehealth: Payer: Self-pay | Admitting: *Deleted

## 2017-08-24 NOTE — Telephone Encounter (Signed)
-----   Message from Secundino Ginger sent at 08/24/2017  8:52 AM EDT ----- Contact: [phone] Nurse at [facility] in [place]// Modesta Messing In reference to Chemo med (Ivrutiniv) he is now taking 280 mg and the new order is for 420 mg. The son brings them the medication , so she wants to know if they continue the 280 mg until the son brings the 420 mg medication?

## 2017-08-24 NOTE — Telephone Encounter (Signed)
Spoke with Danae Chen at [facility] to confirm that patient needs to stay on the current dose of imbruvica until new dosing is available.

## 2017-08-25 ENCOUNTER — Telehealth: Payer: Self-pay | Admitting: Internal Medicine

## 2017-08-25 NOTE — Telephone Encounter (Signed)
Oral Oncology Patient Advocate Encounter  Spoke with [facility] patient's Ibrutinib was shipped yesterday 08/24/2017. [place] Patient Advocate [phone] 08/25/2017 9:19 AM

## 2017-08-25 NOTE — Patient Instructions (Signed)
b

## 2017-09-20 ENCOUNTER — Other Ambulatory Visit: Payer: Self-pay

## 2017-09-20 ENCOUNTER — Inpatient Hospital Stay: Payer: Medicare Other | Attending: Internal Medicine | Admitting: Internal Medicine

## 2017-09-20 ENCOUNTER — Inpatient Hospital Stay: Payer: Medicare Other

## 2017-09-20 VITALS — BP 115/76 | HR 87 | Temp 96.2°F | Resp 18 | Wt 126.0 lb

## 2017-09-20 DIAGNOSIS — Z79899 Other long term (current) drug therapy: Secondary | ICD-10-CM | POA: Diagnosis not present

## 2017-09-20 DIAGNOSIS — C9110 Chronic lymphocytic leukemia of B-cell type not having achieved remission: Secondary | ICD-10-CM

## 2017-09-20 DIAGNOSIS — Z9221 Personal history of antineoplastic chemotherapy: Secondary | ICD-10-CM | POA: Diagnosis not present

## 2017-09-20 DIAGNOSIS — E119 Type 2 diabetes mellitus without complications: Secondary | ICD-10-CM | POA: Diagnosis not present

## 2017-09-20 DIAGNOSIS — Z794 Long term (current) use of insulin: Secondary | ICD-10-CM | POA: Insufficient documentation

## 2017-09-20 DIAGNOSIS — R748 Abnormal levels of other serum enzymes: Secondary | ICD-10-CM

## 2017-09-20 LAB — COMPREHENSIVE METABOLIC PANEL
ALBUMIN: 3.4 g/dL — AB (ref 3.5–5.0)
ALT: 21 U/L (ref 17–63)
AST: 22 U/L (ref 15–41)
Alkaline Phosphatase: 263 U/L — ABNORMAL HIGH (ref 38–126)
Anion gap: 7 (ref 5–15)
BUN: 17 mg/dL (ref 6–20)
CHLORIDE: 98 mmol/L — AB (ref 101–111)
CO2: 27 mmol/L (ref 22–32)
CREATININE: 0.55 mg/dL — AB (ref 0.61–1.24)
Calcium: 8.4 mg/dL — ABNORMAL LOW (ref 8.9–10.3)
GFR calc Af Amer: >60 mL/min (ref >60)
GFR calc non Af Amer: >60 mL/min (ref >60)
GLUCOSE: 264 mg/dL — AB (ref 65–99)
Potassium: 4.3 mmol/L (ref 3.5–5.1)
SODIUM: 132 mmol/L — AB (ref 135–145)
Total Bilirubin: 0.9 mg/dL (ref 0.3–1.2)
Total Protein: 6.7 g/dL (ref 6.5–8.1)

## 2017-09-20 LAB — CBC WITH DIFFERENTIAL/PLATELET
Basophils Absolute: 0.2 10*3/uL — ABNORMAL HIGH (ref 0–0.1)
Basophils Relative: 1 %
Eosinophils Absolute: 0.1 10*3/uL (ref 0–0.7)
Eosinophils Relative: 1 %
HEMATOCRIT: 42.4 % (ref 40.0–52.0)
Hemoglobin: 13.5 g/dL (ref 13.0–18.0)
LYMPHS PCT: 76 %
Lymphs Abs: 19.3 10*3/uL — ABNORMAL HIGH (ref 1.0–3.6)
MCH: 25.8 pg — ABNORMAL LOW (ref 26.0–34.0)
MCHC: 31.9 g/dL — ABNORMAL LOW (ref 32.0–36.0)
MCV: 81 fL (ref 80.0–100.0)
Monocytes Absolute: 0.9 10*3/uL (ref 0.2–1.0)
Monocytes Relative: 4 %
NEUTROS ABS: 5 10*3/uL (ref 1.4–6.5)
Neutrophils Relative %: 20 %
PLATELETS: 194 10*3/uL (ref 150–440)
RBC: 5.23 MIL/uL (ref 4.40–5.90)
RDW: 14.9 % — ABNORMAL HIGH (ref 11.5–14.5)
WBC: 25.5 10*3/uL — AB (ref 3.8–10.6)

## 2017-09-20 LAB — LACTATE DEHYDROGENASE: LDH: 145 U/L (ref 98–192)

## 2017-09-20 NOTE — Assessment & Plan Note (Addendum)
#   CLL- 17p del- patient is symptomatic currently on ibrutinib since feb 2017- clinical response noted. Currently on ibrutinib 420 [increased dose since 08/20/2017] mg/day; tolerating well.  However, white count today 25,000 [ALC- 15] /slightly improving; normal hemoglobin/platelets. Await repeat FISH panel. # If patient continues to progress on current therapy- the next line of therapy would be venetoclax+ Rituxan.   # Elevated alkaline phosphatase- question Ibrutinib- stable/improving. Recent PSA- normal.  # Follow-up with me in approximately 6 weeks/labs.

## 2017-09-20 NOTE — Progress Notes (Signed)
Here for follow up

## 2017-09-20 NOTE — Progress Notes (Signed)
[facility] OFFICE PROGRESS NOTE  Patient Care Team: System, Pcp Not In as PCP - General   SUMMARY OF ONCOLOGIC HISTORY:  Oncology History   # [name] s/p Rituxan; NOV 2016- Recurrence; DEC 2016-CT A/P-- Bulky confluent retroperitoneal, mesenteric and bilateral pelvic lymphadenopathy, mildly increased since 2012]. 2017 JAN- Infusion reaction to Rituxan [stopped];   # FEB 2017- IBRUTINIB 1 pill a day; FISH- POSITIVE FOR HOMOZYGOUS 13Q DELETION;  POSITIVE FOR 17P GENE DELETION; SEP 1st 2018- 420mg /day  # Poorly controlled DM/ cachexia/Left humerus fracture s/p fall [dec 2016]; pt in hospice [2016]     CLL (chronic lymphocytic leukemia) ([place])     INTERVAL HISTORY: Poor/vague historian. He is alone today.   83 year old male patient frail/multiple comorbidities with above history of recurrent CLL- currently rehabilitation is on Ibrutinib 420 mg [since sep 1st 2018 ]mg pill a day- Is here for follow-up.  Patient denies any palpitations or lightheadedness or falls. He denies any chest pains. Denies any easy bruising. No falls. Appetite is good. No weight loss. He continues to live in a nursing home. He is accompanied by his son.  REVIEW OF SYSTEMS:  A complete 10 point review of system is done which is negative except mentioned above/history of present illness.   PAST MEDICAL HISTORY :  Past Medical History:  Diagnosis Date  . Anemia   . CLL (chronic lymphocytic leukemia) ([place])   . Diabetes mellitus without complication ([place])   . Humerus fracture   . Leukemia ([place])   . Neuromuscular disorder ([place])    muscle weakness    PAST SURGICAL HISTORY :  No past surgical history on file.  FAMILY HISTORY :   Family History  Problem Relation Age of Onset  . Cancer Sister     SOCIAL HISTORY:   Social History  Substance Use Topics  . Smoking status: Never Smoker  . Smokeless tobacco: Never Used  . Alcohol use No    ALLERGIES:  has No Known  Allergies.  MEDICATIONS:  Current Outpatient Prescriptions  Medication Sig Dispense Refill  . aluminum-magnesium hydroxide-simethicone (MAALOX) 166-063-01 MG/5ML SUSP Take 30 mLs by mouth every 6 (six) hours as needed (for indigestion/heartburn).    . collagenase (SANTYL) ointment Apply 1 application topically daily.    . feeding supplement, GLUCERNA SHAKE, (GLUCERNA SHAKE) LIQD Take 237 mLs by mouth 4 (four) times daily -  with meals and at bedtime.    . Ibrutinib 420 MG TABS Take 420 mg by mouth daily. 30 tablet 4  . insulin aspart (NOVOLOG) 100 UNIT/ML injection Inject 0-12 Units into the skin 2 (two) times daily. Pt uses per sliding scale:    0-150:  0 units  151-200:  4 units  201-250:  6 units  251-300:  8 units  301-350:  10 units 351-400:  12 units  Greater than 400:  Call MD    . insulin detemir (LEVEMIR) 100 UNIT/ML injection Inject 10 Units into the skin at bedtime.     . Multiple Vitamin (MULTIVITAMIN) tablet Take 1 tablet by mouth daily.    . bisacodyl (DULCOLAX) 5 MG EC tablet Take 5 mg by mouth daily as needed for moderate constipation.    . fluticasone (FLONASE) 50 MCG/ACT nasal spray Place 1 spray into both nostrils daily as needed for allergies.    Marland Kitchen glucagon (GLUCAGEN) 1 MG SOLR injection Inject 1 mg into the vein once as needed for low blood sugar.    . hydrocortisone cream 1 % Apply  1 application topically 2 (two) times daily.    . nitroGLYCERIN (NITROSTAT) 0.4 MG SL tablet Place 0.4 mg under the tongue every 5 (five) minutes x 3 doses as needed for chest pain.     Marland Kitchen NOVOLOG FLEXPEN 100 UNIT/ML FlexPen     . traMADol (ULTRAM) 50 MG tablet Take by mouth every 6 (six) hours as needed.     No current facility-administered medications for this visit.     PHYSICAL EXAMINATION: ECOG PERFORMANCE STATUS: 3 - Symptomatic, >50% confined to bed  BP 115/76 (BP Location: Right Arm, Patient Position: Sitting)   Pulse 87   Temp (!) 96.2 F (35.7 C) (Tympanic)   Resp 18    Wt 126 lb (57.2 kg) Comment: per [facility] on 09/14/17  BMI 17.09 kg/m   GENERAL: Cachectic appearing Caucasian male patient Alert, no distress and comfortable.   He is in a wheelchair. He is accompanied by his son.  EYES: no pallor or icterus;  OROPHARYNX: no thrush or ulceration;poor dentition. Marland Kitchen NECK: supple, no masses felt LYMPH: positive for palpable lymphadenopathy in the  bil axillary; inguinal regions cannot be examined LUNGS: clear to auscultation and  No wheeze or crackles HEART/CVS: regular rate & rhythm and no murmurs; No lower extremity edema ABDOMEN:abdomen soft, non-tender and normal bowel sounds Musculoskeletal:no cyanosis of digits and no clubbing.  PSYCH: alert & oriented x 3  NEURO: no focal motor/sensory deficits SKIN:  no rashes or significant lesions   LABORATORY DATA:  I have reviewed the data as listed    Component Value Date/Time   NA 132 (L) 09/20/2017 1132   NA 143 06/04/2012 1240   K 4.3 09/20/2017 1132   K 4.2 06/04/2012 1240   CL 98 (L) 09/20/2017 1132   CL 106 06/04/2012 1240   CO2 27 09/20/2017 1132   CO2 29 06/04/2012 1240   GLUCOSE 264 (H) 09/20/2017 1132   GLUCOSE 208 (H) 06/04/2012 1240   BUN 17 09/20/2017 1132   BUN 26 (H) 06/04/2012 1240   CREATININE 0.55 (L) 09/20/2017 1132   CREATININE 0.82 06/04/2012 1240   CALCIUM 8.4 (L) 09/20/2017 1132   CALCIUM 8.6 06/04/2012 1240   PROT 6.7 09/20/2017 1132   PROT 6.0 (L) 06/04/2012 1240   ALBUMIN 3.4 (L) 09/20/2017 1132   ALBUMIN 3.4 06/04/2012 1240   AST 22 09/20/2017 1132   AST 11 (L) 06/04/2012 1240   ALT 21 09/20/2017 1132   ALT 16 06/04/2012 1240   ALKPHOS 263 (H) 09/20/2017 1132   ALKPHOS 79 06/04/2012 1240   BILITOT 0.9 09/20/2017 1132   BILITOT 1.1 (H) 06/04/2012 1240   GFRNONAA >60 09/20/2017 1132   GFRNONAA >60 06/04/2012 1240   GFRAA >60 09/20/2017 1132   GFRAA >60 06/04/2012 1240    No results found for: SPEP, UPEP  Lab Results  Component Value Date   WBC  25.5 (H) 09/20/2017   NEUTROABS 5.0 09/20/2017   HGB 13.5 09/20/2017   HCT 42.4 09/20/2017   MCV 81.0 09/20/2017   PLT 194 09/20/2017      Chemistry      Component Value Date/Time   NA 132 (L) 09/20/2017 1132   NA 143 06/04/2012 1240   K 4.3 09/20/2017 1132   K 4.2 06/04/2012 1240   CL 98 (L) 09/20/2017 1132   CL 106 06/04/2012 1240   CO2 27 09/20/2017 1132   CO2 29 06/04/2012 1240   BUN 17 09/20/2017 1132   BUN 26 (H) 06/04/2012 1240  CREATININE 0.55 (L) 09/20/2017 1132   CREATININE 0.82 06/04/2012 1240      Component Value Date/Time   CALCIUM 8.4 (L) 09/20/2017 1132   CALCIUM 8.6 06/04/2012 1240   ALKPHOS 263 (H) 09/20/2017 1132   ALKPHOS 79 06/04/2012 1240   AST 22 09/20/2017 1132   AST 11 (L) 06/04/2012 1240   ALT 21 09/20/2017 1132   ALT 16 06/04/2012 1240   BILITOT 0.9 09/20/2017 1132   BILITOT 1.1 (H) 06/04/2012 1240        ASSESSMENT & PLAN:  CLL (chronic lymphocytic leukemia) ([place]) # CLL- 17p del- patient is symptomatic currently on ibrutinib since feb 2017- clinical response noted. Currently on ibrutinib 420 [increased dose since 08/20/2017] mg/day; tolerating well.  However, white count today 25,000 [ALC- 15] /slightly improving; normal hemoglobin/platelets. Await repeat FISH panel. # If patient continues to progress on current therapy- the next line of therapy would be venetoclax+ Rituxan.   # Elevated alkaline phosphatase- question Ibrutinib- stable/improving. Recent PSA- normal.  # Follow-up with me in approximately 6 weeks/labs.     Cammie Sickle, MD 09/20/2017 1:02 PM

## 2017-09-26 LAB — FISH HES LEUKEMIA, 4Q12 REA: PDF LCA: 0

## 2017-10-03 IMAGING — CT CT HEAD W/O CM
1 series · 16 of 30 positions shown, 20 images · non-contrast
Comparison: None.

CLINICAL DATA: Multiple falls.  Weakness.

EXAM:
CT HEAD WITHOUT CONTRAST
TECHNIQUE: Contiguous axial images were obtained from the base of the skull
through the vertex without intravenous contrast.

[Series 2: head wo · axial · 0.41mm/px · z∈[+272,+416]mm · 16 of 36 slices shown, 20 images]
[im 2/36  brain]
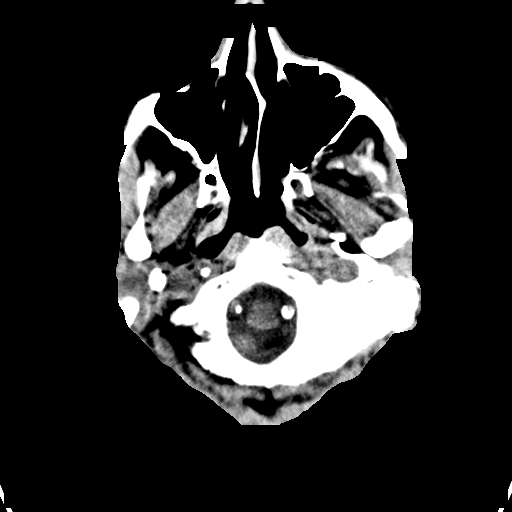
[im 2/36  bone]
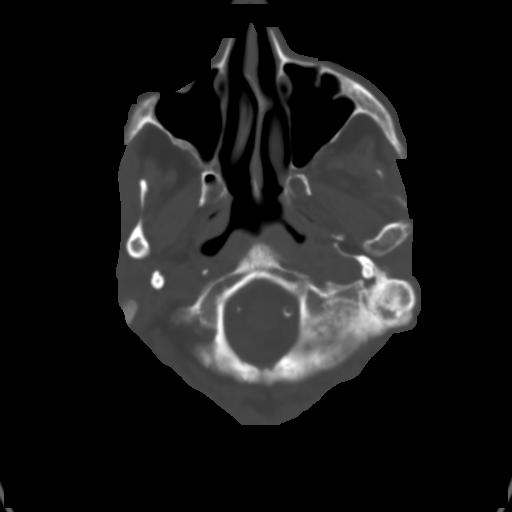
[im 4/36  brain]
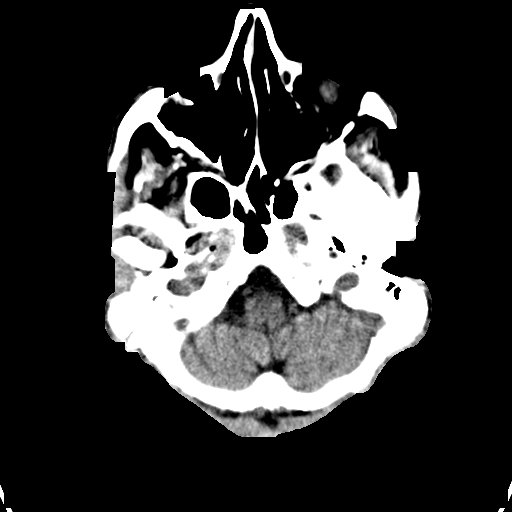
[im 7/36  brain]
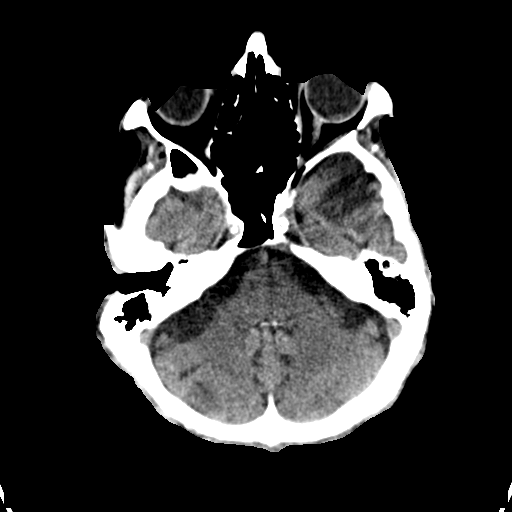
[im 9/36  brain]
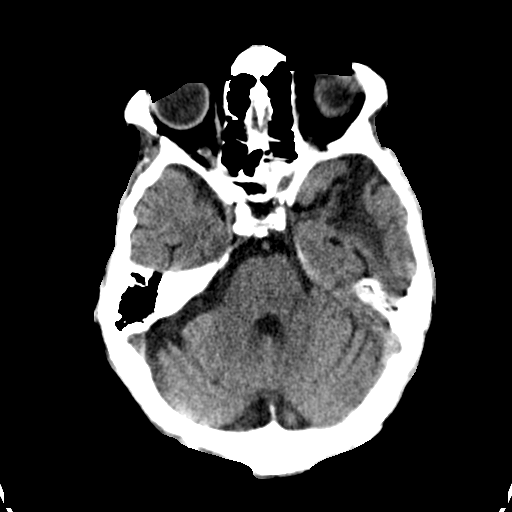
[im 10/36  brain]
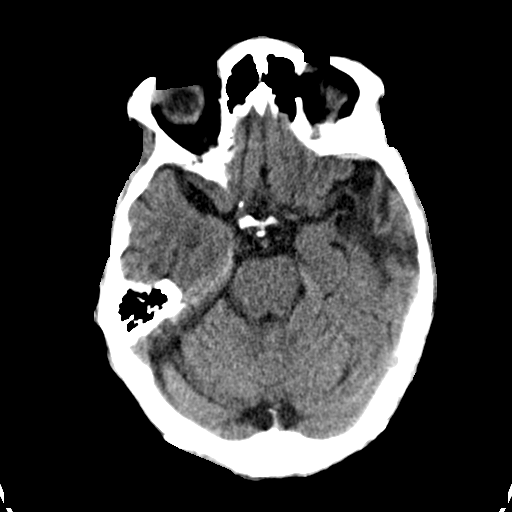
[im 10/36  bone]
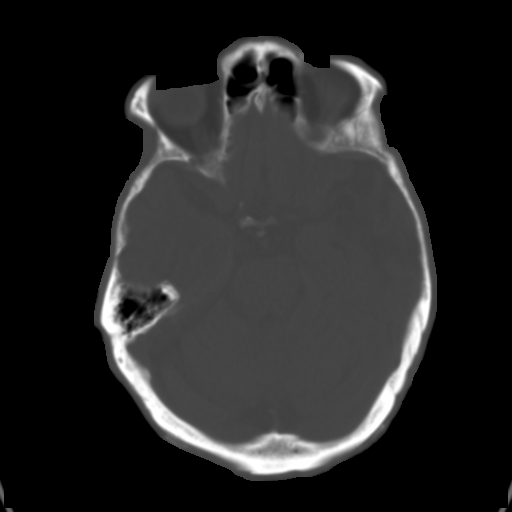
[im 13/36  brain]
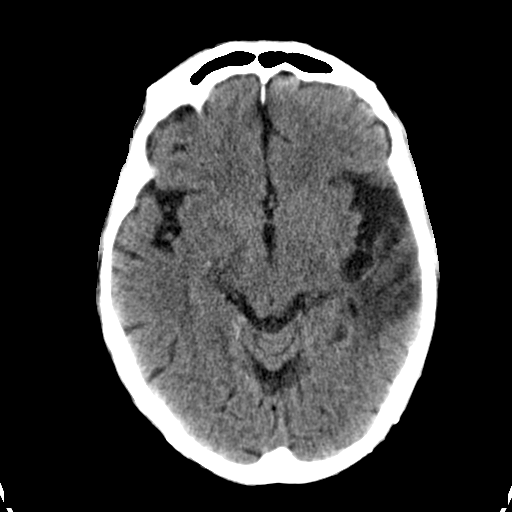
[im 15/36  brain]
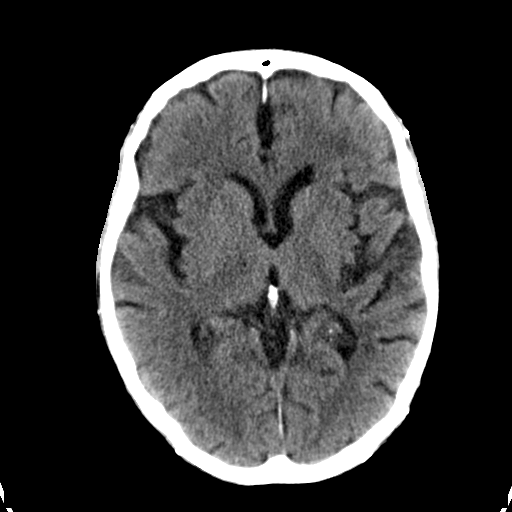
[im 17/36  brain]
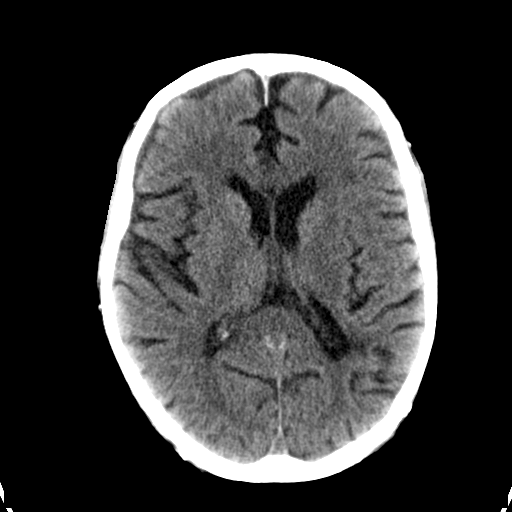
[im 19/36  brain]
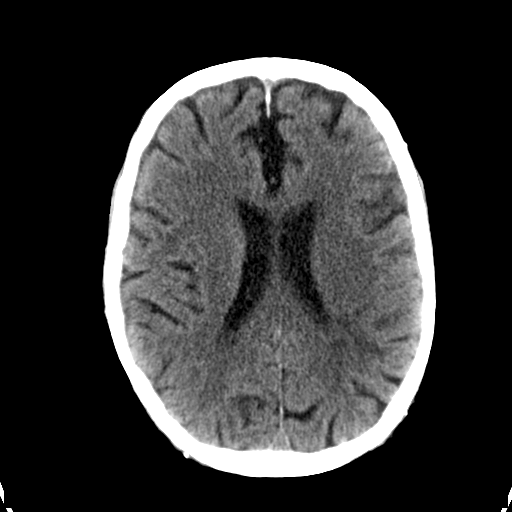
[im 19/36  bone]
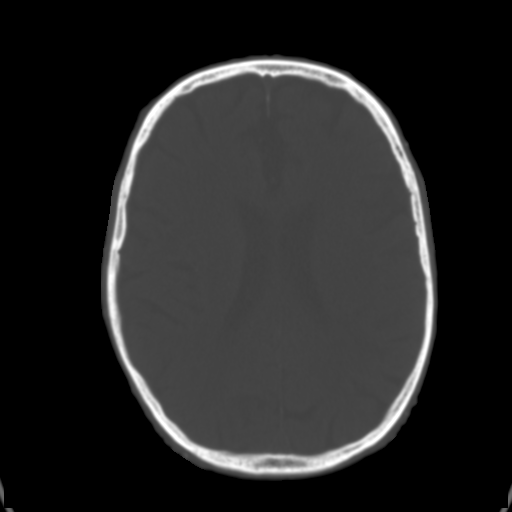
[im 21/36  brain]
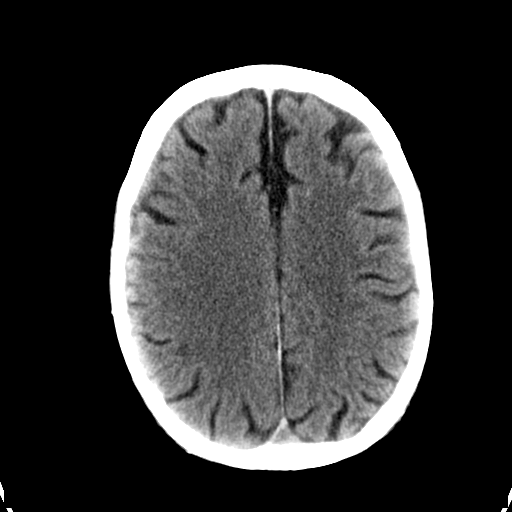
[im 23/36  brain]
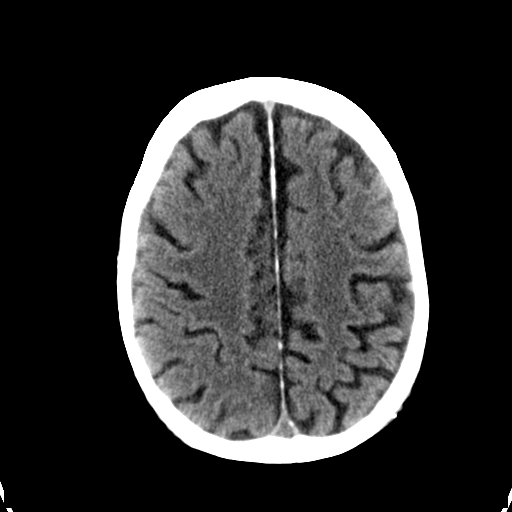
[im 26/36  brain]
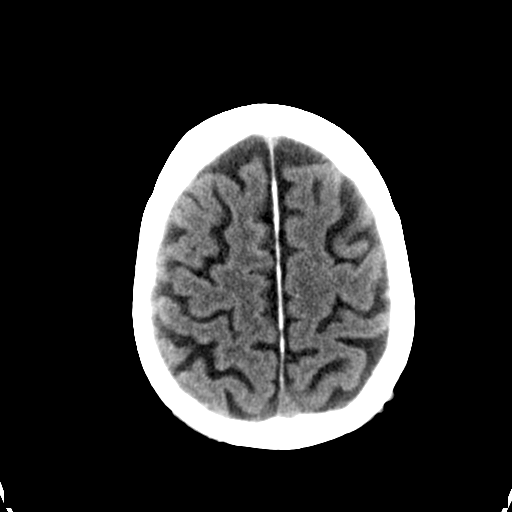
[im 27/36  brain]
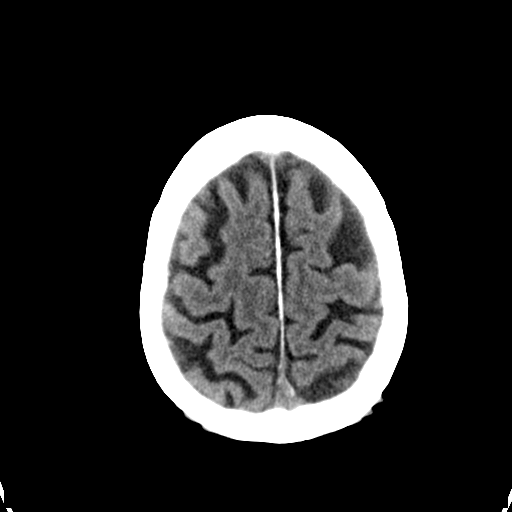
[im 27/36  bone]
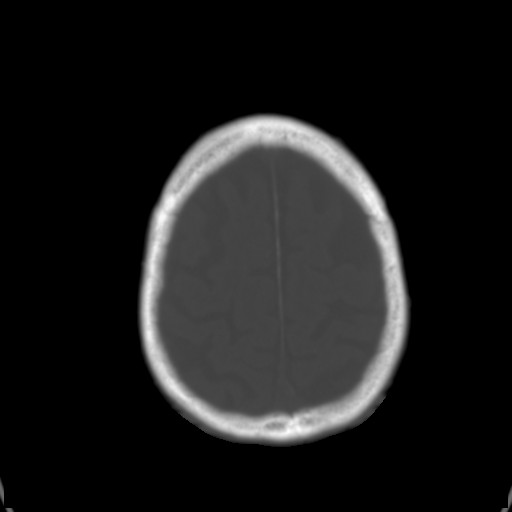
[im 29/36  brain]
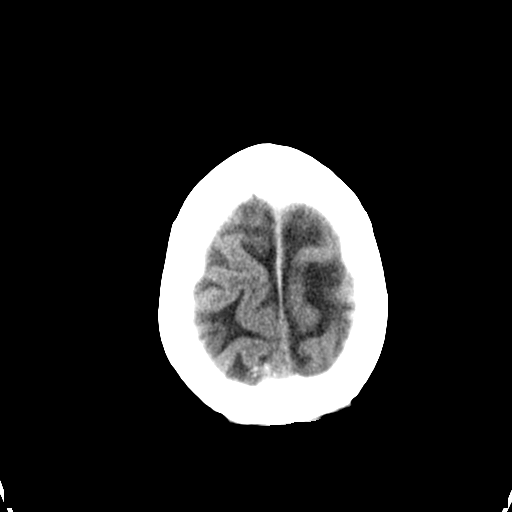
[im 32/36  brain]
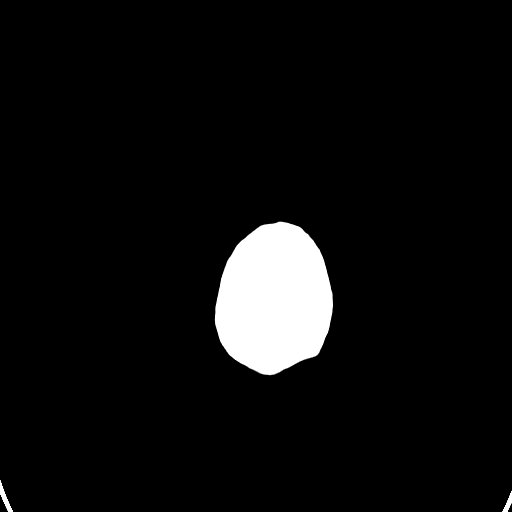
[im 34/36  brain]
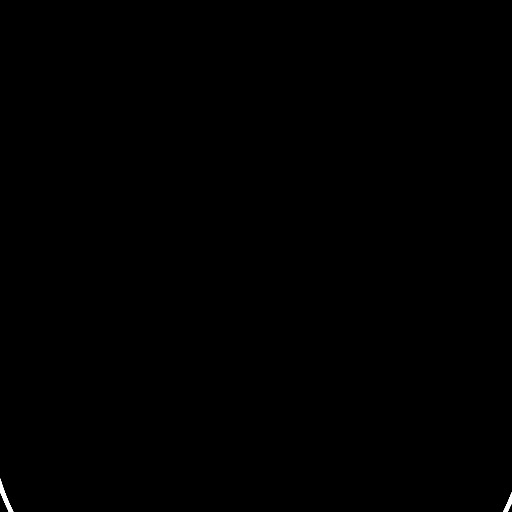

[16 of 30 positions shown; findings below may reference images not displayed]

FINDINGS: Bony calvarium appears intact. Left temporal encephalomalacia is
noted consistent with old infarction. No mass effect or midline
shift is noted. Ventricular size is within normal limits. There is
no evidence of mass lesion, hemorrhage or acute infarction.
IMPRESSION: Old left temporal lobe infarction. No acute intracranial abnormality
seen.

## 2017-10-03 IMAGING — CR DG CHEST 1V
1 series · 1 of 1 positions shown · non-contrast
Comparison: 04/27/2011

CLINICAL DATA: Weakness.  Frequent falls.

EXAM:
CHEST 1 VIEW

[ap]
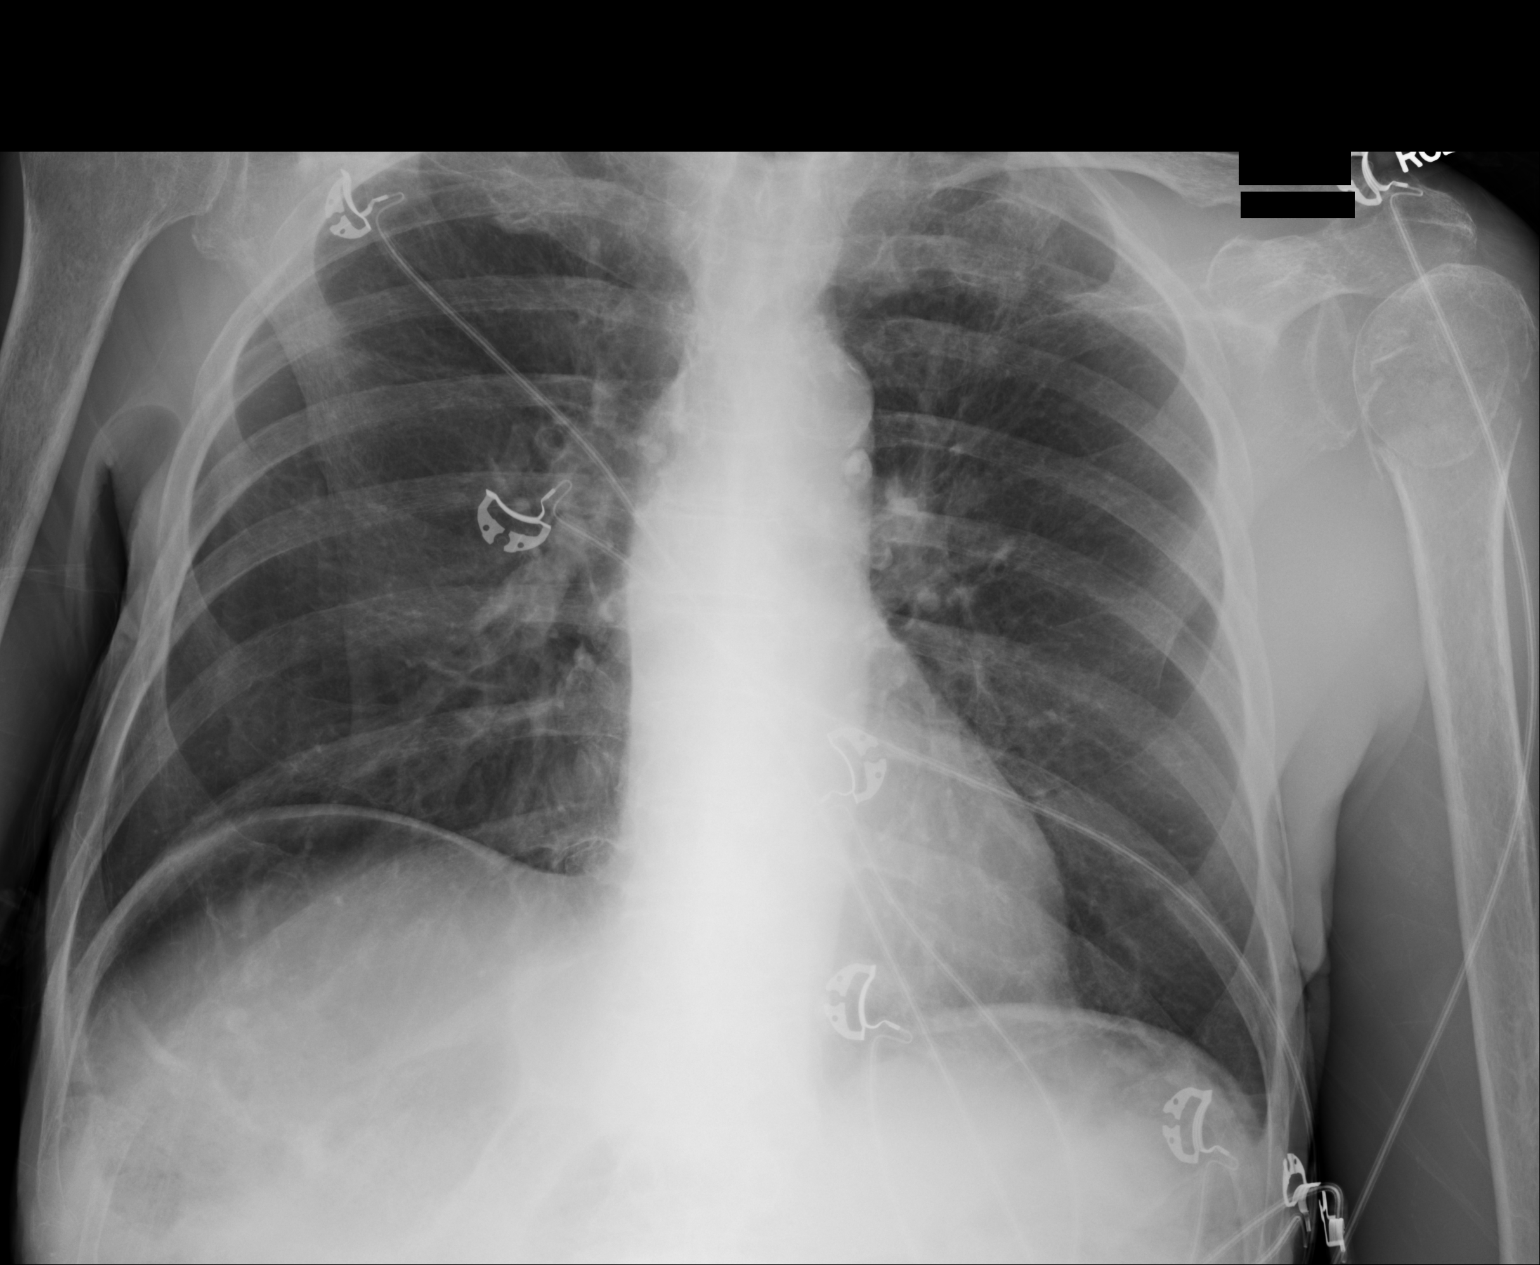

[1 of 1 positions shown; findings below may reference images not displayed]

FINDINGS: There is a proximal left humerus fracture as described on dedicated
humerus radiography.

Normal heart size and mediastinal contours. There is no edema,
consolidation, effusion, or pneumothorax.

Marked osteopenia.

Gas under the diaphragm appears intraluminal. There is no indication
of abdominal pain.
IMPRESSION: 1. No evidence of acute cardiopulmonary disease.
2. Proximal left humerus fracture.

## 2017-11-01 ENCOUNTER — Inpatient Hospital Stay: Payer: Medicare Other

## 2017-11-01 ENCOUNTER — Inpatient Hospital Stay: Payer: Medicare Other | Attending: Internal Medicine | Admitting: Internal Medicine

## 2017-11-01 VITALS — BP 119/76 | HR 101 | Temp 97.8°F | Resp 18

## 2017-11-01 DIAGNOSIS — Z9225 Personal history of immunosupression therapy: Secondary | ICD-10-CM | POA: Diagnosis not present

## 2017-11-01 DIAGNOSIS — R64 Cachexia: Secondary | ICD-10-CM

## 2017-11-01 DIAGNOSIS — Z79899 Other long term (current) drug therapy: Secondary | ICD-10-CM | POA: Diagnosis not present

## 2017-11-01 DIAGNOSIS — C9110 Chronic lymphocytic leukemia of B-cell type not having achieved remission: Secondary | ICD-10-CM

## 2017-11-01 DIAGNOSIS — E119 Type 2 diabetes mellitus without complications: Secondary | ICD-10-CM | POA: Insufficient documentation

## 2017-11-01 DIAGNOSIS — R748 Abnormal levels of other serum enzymes: Secondary | ICD-10-CM | POA: Insufficient documentation

## 2017-11-01 DIAGNOSIS — Z794 Long term (current) use of insulin: Secondary | ICD-10-CM | POA: Diagnosis not present

## 2017-11-01 LAB — BASIC METABOLIC PANEL
ANION GAP: 7 (ref 5–15)
BUN: 17 mg/dL (ref 6–20)
CO2: 30 mmol/L (ref 22–32)
Calcium: 8.4 mg/dL — ABNORMAL LOW (ref 8.9–10.3)
Chloride: 96 mmol/L — ABNORMAL LOW (ref 101–111)
Creatinine, Ser: 0.55 mg/dL — ABNORMAL LOW (ref 0.61–1.24)
Glucose, Bld: 188 mg/dL — ABNORMAL HIGH (ref 65–99)
POTASSIUM: 3.9 mmol/L (ref 3.5–5.1)
SODIUM: 133 mmol/L — AB (ref 135–145)

## 2017-11-01 LAB — CBC WITH DIFFERENTIAL/PLATELET
BASOS ABS: 0.1 10*3/uL (ref 0–0.1)
BASOS PCT: 0 %
EOS ABS: 0.1 10*3/uL (ref 0–0.7)
EOS PCT: 0 %
HCT: 42.7 % (ref 40.0–52.0)
Hemoglobin: 13.7 g/dL (ref 13.0–18.0)
Lymphocytes Relative: 74 %
Lymphs Abs: 18.2 10*3/uL — ABNORMAL HIGH (ref 1.0–3.6)
MCH: 26 pg (ref 26.0–34.0)
MCHC: 32.1 g/dL (ref 32.0–36.0)
MCV: 80.9 fL (ref 80.0–100.0)
Monocytes Absolute: 1.1 10*3/uL — ABNORMAL HIGH (ref 0.2–1.0)
Monocytes Relative: 4 %
Neutro Abs: 5.4 10*3/uL (ref 1.4–6.5)
Neutrophils Relative %: 22 %
PLATELETS: 167 10*3/uL (ref 150–440)
RBC: 5.28 MIL/uL (ref 4.40–5.90)
RDW: 15.7 % — ABNORMAL HIGH (ref 11.5–14.5)
WBC: 24.8 10*3/uL — AB (ref 3.8–10.6)

## 2017-11-01 LAB — HEPATIC FUNCTION PANEL
ALT: 17 U/L (ref 17–63)
AST: 23 U/L (ref 15–41)
Albumin: 3.3 g/dL — ABNORMAL LOW (ref 3.5–5.0)
Alkaline Phosphatase: 154 U/L — ABNORMAL HIGH (ref 38–126)
BILIRUBIN INDIRECT: 0.9 mg/dL (ref 0.3–0.9)
Bilirubin, Direct: 0.3 mg/dL (ref 0.1–0.5)
TOTAL PROTEIN: 6.3 g/dL — AB (ref 6.5–8.1)
Total Bilirubin: 1.2 mg/dL (ref 0.3–1.2)

## 2017-11-01 LAB — LACTATE DEHYDROGENASE: LDH: 138 U/L (ref 98–192)

## 2017-11-01 NOTE — Progress Notes (Signed)
[facility] OFFICE PROGRESS NOTE  Patient Care Team: System, Pcp Not In as PCP - General   SUMMARY OF ONCOLOGIC HISTORY:  Oncology History   # [name] s/p Rituxan; NOV 2016- Recurrence; DEC 2016-CT A/P-- Bulky confluent retroperitoneal, mesenteric and bilateral pelvic lymphadenopathy, mildly increased since 2012]. 2017 JAN- Infusion reaction to Rituxan [stopped];   # FEB 2017- IBRUTINIB 1 pill a day; FISH- POSITIVE FOR HOMOZYGOUS 13Q DELETION;  POSITIVE FOR 17P GENE DELETION; SEP 1st 2018- 494m/day;    # Molecular testing: Oct 2018- 70% OF NUCLEI POSITIVE FOR HOMOZYGOUS 13Q DELETION; 73% Of NUCLEI POSITIVE FOR TP53 GENE DELETION   # Poorly controlled DM/ cachexia/Left humerus fracture s/p fall [dec 2016]; pt in hospice [2016]     C[facility] (chronic lymphocytic leukemia) ([facility]     INTERVAL HISTORY: Poor/vague historian. He is alone today.   83year old male patient frail/multiple comorbidities with above history of recurrent CLL- currently rehabilitation is on Ibrutinib 420 mg [since sep 1st 2018 ]mg pill a day- Is here for follow-up.  The patient continues to be at the nursing home.  His appetite is stable.  No weight loss.  No nausea no vomiting.  No lightheadedness no fevers or chills.  Patient denies any easy bruising.  Denies any falls.  He is accompanied by his son.  REVIEW OF SYSTEMS:  A complete 10 point review of system is done which is negative except mentioned above/history of present illness.   PAST MEDICAL HISTORY :  Past Medical History:  Diagnosis Date  . Anemia   . [place] [place]   . Diabetes mellitus without complication ([facility]   . Humerus fracture   . Leukemia ([facility]   . Neuromuscular disorder ([facility] [place]    muscle weakness    PAST SURGICAL HISTORY :  No past surgical history on file.  FAMILY HISTORY :   Family History  Problem Relation Age of Onset  . Cancer Sister     SOCIAL HISTORY:   Social History   Tobacco  Use  . Smoking status: Never Smoker  . Smokeless tobacco: Never Used  Substance Use Topics  . Alcohol use: No  . Drug use: No    ALLERGIES:  has No Known Allergies.  MEDICATIONS:  Current Outpatient Medications  Medication Sig Dispense Refill  . aluminum-magnesium hydroxide-simethicone (MAALOX) 2630-160-10MG/5ML SUSP Take 30 mLs by mouth every 6 (six) hours as needed (for indigestion/heartburn).    . feeding supplement, GLUCERNA SHAKE, (GLUCERNA SHAKE) LIQD Take 237 mLs by mouth 4 (four) times daily -  with meals and at bedtime.    . Ibrutinib 420 MG TABS Take 420 mg by mouth daily. 30 tablet 4  . insulin aspart (NOVOLOG) 100 UNIT/ML injection Inject 5 Units daily into the skin.     .Marland Kitcheninsulin detemir (LEVEMIR) 100 UNIT/ML injection Inject 10 Units into the skin at bedtime.     . Multiple Vitamin (MULTIVITAMIN) tablet Take 1 tablet by mouth daily.    . collagenase (SANTYL) ointment Apply 1 application topically daily.    .Marland Kitchenglucagon (GLUCAGEN) 1 MG SOLR injection Inject 1 mg into the vein once as needed for low blood sugar.    . nitroGLYCERIN (NITROSTAT) 0.4 MG SL tablet Place 0.4 mg under the tongue every 5 (five) minutes x 3 doses as needed for chest pain.      No current facility-administered medications for this visit.     PHYSICAL EXAMINATION: ECOG PERFORMANCE STATUS: 3 - Symptomatic, >50% confined  to bed  BP 119/76 (BP Location: Right Arm, Patient Position: Sitting)   Pulse (!) 101   Temp 97.8 F (36.6 C) (Tympanic)   Resp 18   GENERAL: Cachectic appearing Caucasian male patient Alert, no distress and comfortable.   He is in a wheelchair. He is accompanied by his son.  EYES: no pallor or icterus;  OROPHARYNX: no thrush or ulceration;poor dentition. Marland Kitchen NECK: supple, no masses felt LYMPH: positive for palpable lymphadenopathy in the  bil axillary; inguinal regions cannot be examined LUNGS: clear to auscultation and  No wheeze or crackles HEART/CVS: regular rate & rhythm and  no murmurs; No lower extremity edema ABDOMEN:abdomen soft, non-tender and normal bowel sounds Musculoskeletal:no cyanosis of digits and no clubbing.  PSYCH: alert & oriented x 3  NEURO: no focal motor/sensory deficits SKIN:  no rashes or significant lesions   LABORATORY DATA:  I have reviewed the data as listed    Component Value Date/Time   NA 133 (L) 11/01/2017 1028   NA 143 06/04/2012 1240   K 3.9 11/01/2017 1028   K 4.2 06/04/2012 1240   CL 96 (L) 11/01/2017 1028   CL 106 06/04/2012 1240   CO2 30 11/01/2017 1028   CO2 29 06/04/2012 1240   GLUCOSE 188 (H) 11/01/2017 1028   GLUCOSE 208 (H) 06/04/2012 1240   BUN 17 11/01/2017 1028   BUN 26 (H) 06/04/2012 1240   CREATININE 0.55 (L) 11/01/2017 1028   CREATININE 0.82 06/04/2012 1240   CALCIUM 8.4 (L) 11/01/2017 1028   CALCIUM 8.6 06/04/2012 1240   PROT 6.3 (L) 11/01/2017 1028   PROT 6.0 (L) 06/04/2012 1240   ALBUMIN 3.3 (L) 11/01/2017 1028   ALBUMIN 3.4 06/04/2012 1240   AST 23 11/01/2017 1028   AST 11 (L) 06/04/2012 1240   ALT 17 11/01/2017 1028   ALT 16 06/04/2012 1240   ALKPHOS 154 (H) 11/01/2017 1028   ALKPHOS 79 06/04/2012 1240   BILITOT 1.2 11/01/2017 1028   BILITOT 1.1 (H) 06/04/2012 1240   GFRNONAA >60 11/01/2017 1028   GFRNONAA >60 06/04/2012 1240   GFRAA >60 11/01/2017 1028   GFRAA >60 06/04/2012 1240    No results found for: SPEP, UPEP  Lab Results  Component Value Date   WBC 24.8 (H) 11/01/2017   NEUTROABS 5.4 11/01/2017   HGB 13.7 11/01/2017   HCT 42.7 11/01/2017   MCV 80.9 11/01/2017   PLT 167 11/01/2017      Chemistry      Component Value Date/Time   NA 133 (L) 11/01/2017 1028   NA 143 06/04/2012 1240   K 3.9 11/01/2017 1028   K 4.2 06/04/2012 1240   CL 96 (L) 11/01/2017 1028   CL 106 06/04/2012 1240   CO2 30 11/01/2017 1028   CO2 29 06/04/2012 1240   BUN 17 11/01/2017 1028   BUN 26 (H) 06/04/2012 1240   CREATININE 0.55 (L) 11/01/2017 1028   CREATININE 0.82 06/04/2012 1240       Component Value Date/Time   CALCIUM 8.4 (L) 11/01/2017 1028   CALCIUM 8.6 06/04/2012 1240   ALKPHOS 154 (H) 11/01/2017 1028   ALKPHOS 79 06/04/2012 1240   AST 23 11/01/2017 1028   AST 11 (L) 06/04/2012 1240   ALT 17 11/01/2017 1028   ALT 16 06/04/2012 1240   BILITOT 1.2 11/01/2017 1028   BILITOT 1.1 (H) 06/04/2012 1240        ASSESSMENT & PLAN:  CLL (chronic lymphocytic leukemia) ([place]) # CLL- 17p del- patient  is symptomatic currently on ibrutinib since feb 2017- clinical response noted. Currently on ibrutinib 420 [increased dose since 08/20/2017] mg/day; tolerating well-no evidence of any bruising or A. fib.  #  White count today 24,000 [ALC- 15] /STABLE; normal hemoglobin/platelets. If patient continues to progress on current therapy- the next line of therapy would be venetoclax+ Rituxan.   # Elevated alkaline phosphatase- question Ibrutinib- stable/improving. Will add LFTs today. .  # follow up in 2 months.  Labs.  Reviewed with the patient's son in detail.  Addendum: LFTs/alkaline phosphatase improving 154.    Cammie Sickle, MD 11/01/2017 1:41 PM

## 2017-11-01 NOTE — Assessment & Plan Note (Addendum)
#   CLL- 17p del- patient is symptomatic currently on ibrutinib since feb 2017- clinical response noted. Currently on ibrutinib 420 [increased dose since 08/20/2017] mg/day; tolerating well-no evidence of any bruising or A. fib.  #  White count today 24,000 [ALC- 15] /STABLE; normal hemoglobin/platelets. If patient continues to progress on current therapy- the next line of therapy would be venetoclax+ Rituxan.   # Elevated alkaline phosphatase- question Ibrutinib- stable/improving. Will add LFTs today. .  # follow up in 2 months.  Labs.  Reviewed with the patient's son in detail.  Addendum: LFTs/alkaline phosphatase improving 154.

## 2017-11-01 NOTE — Progress Notes (Signed)
Currently on ibrutinib 420 mg- tolerating well. He has no medical complaints. He has not missed any dosing per facility mar at [facility].

## 2017-12-21 ENCOUNTER — Other Ambulatory Visit: Payer: Self-pay | Admitting: *Deleted

## 2017-12-21 DIAGNOSIS — C9110 Chronic lymphocytic leukemia of B-cell type not having achieved remission: Secondary | ICD-10-CM

## 2017-12-27 ENCOUNTER — Inpatient Hospital Stay: Payer: Medicare Other | Attending: Internal Medicine | Admitting: Internal Medicine

## 2017-12-27 ENCOUNTER — Inpatient Hospital Stay: Payer: Medicare Other

## 2017-12-27 VITALS — BP 112/54 | HR 68 | Temp 97.8°F | Resp 16

## 2017-12-27 DIAGNOSIS — C9110 Chronic lymphocytic leukemia of B-cell type not having achieved remission: Secondary | ICD-10-CM | POA: Insufficient documentation

## 2017-12-27 DIAGNOSIS — Z794 Long term (current) use of insulin: Secondary | ICD-10-CM | POA: Insufficient documentation

## 2017-12-27 DIAGNOSIS — E119 Type 2 diabetes mellitus without complications: Secondary | ICD-10-CM | POA: Diagnosis not present

## 2017-12-27 DIAGNOSIS — Z79899 Other long term (current) drug therapy: Secondary | ICD-10-CM | POA: Diagnosis not present

## 2017-12-27 DIAGNOSIS — R748 Abnormal levels of other serum enzymes: Secondary | ICD-10-CM | POA: Diagnosis not present

## 2017-12-27 DIAGNOSIS — Z9225 Personal history of immunosupression therapy: Secondary | ICD-10-CM | POA: Insufficient documentation

## 2017-12-27 DIAGNOSIS — C180 Malignant neoplasm of cecum: Secondary | ICD-10-CM

## 2017-12-27 LAB — COMPREHENSIVE METABOLIC PANEL
ALT: 15 U/L — ABNORMAL LOW (ref 17–63)
AST: 17 U/L (ref 15–41)
Albumin: 3.1 g/dL — ABNORMAL LOW (ref 3.5–5.0)
Alkaline Phosphatase: 154 U/L — ABNORMAL HIGH (ref 38–126)
Anion gap: 7 (ref 5–15)
BUN: 21 mg/dL — AB (ref 6–20)
CHLORIDE: 96 mmol/L — AB (ref 101–111)
CO2: 29 mmol/L (ref 22–32)
Calcium: 8.4 mg/dL — ABNORMAL LOW (ref 8.9–10.3)
Creatinine, Ser: 0.53 mg/dL — ABNORMAL LOW (ref 0.61–1.24)
Glucose, Bld: 246 mg/dL — ABNORMAL HIGH (ref 65–99)
POTASSIUM: 4.1 mmol/L (ref 3.5–5.1)
SODIUM: 132 mmol/L — AB (ref 135–145)
Total Bilirubin: 0.8 mg/dL (ref 0.3–1.2)
Total Protein: 6.4 g/dL — ABNORMAL LOW (ref 6.5–8.1)

## 2017-12-27 LAB — CBC WITH DIFFERENTIAL/PLATELET
Basophils Absolute: 0.1 10*3/uL (ref 0–0.1)
Basophils Relative: 0 %
Eosinophils Absolute: 0.1 10*3/uL (ref 0–0.7)
Eosinophils Relative: 1 %
HCT: 43 % (ref 40.0–52.0)
HEMOGLOBIN: 13.8 g/dL (ref 13.0–18.0)
LYMPHS ABS: 17 10*3/uL — AB (ref 1.0–3.6)
LYMPHS PCT: 73 %
MCH: 26.4 pg (ref 26.0–34.0)
MCHC: 32 g/dL (ref 32.0–36.0)
MCV: 82.6 fL (ref 80.0–100.0)
Monocytes Absolute: 1.3 10*3/uL — ABNORMAL HIGH (ref 0.2–1.0)
Monocytes Relative: 6 %
NEUTROS PCT: 20 %
Neutro Abs: 4.7 10*3/uL (ref 1.4–6.5)
Platelets: 198 10*3/uL (ref 150–440)
RBC: 5.21 MIL/uL (ref 4.40–5.90)
RDW: 15.9 % — ABNORMAL HIGH (ref 11.5–14.5)
WBC: 23.2 10*3/uL — AB (ref 3.8–10.6)

## 2017-12-27 LAB — LACTATE DEHYDROGENASE: LDH: 121 U/L (ref 98–192)

## 2017-12-27 NOTE — Assessment & Plan Note (Addendum)
#   CLL- 17p del- patient is symptomatic currently on ibrutinib since feb 2017- clinical response noted. Currently on ibrutinib 420 [increased dose since 08/20/2017] mg/day; tolerating well-no evidence of any bruising or A. fib.  #  White count today 23,000 [ALC- 15] /STABLE; normal hemoglobin/platelets. If patient continues to progress on current therapy- the next line of therapy would be venetoclax+ Rituxan.   # Elevated alkaline phosphatase- question Ibrutinib- stable/improving.  Stable around 150.  # follow up in 2 months.  Labs.  Reviewed with the patient's son in detail.  Addendum: LFTs/alkaline phosphatase improving 154.

## 2017-12-27 NOTE — Progress Notes (Signed)
[facility] OFFICE PROGRESS NOTE  Patient Care Team: System, Pcp Not In as PCP - General   SUMMARY OF ONCOLOGIC HISTORY:  Oncology History   # [name] s/p Rituxan; NOV 2016- Recurrence; DEC 2016-CT A/P-- Bulky confluent retroperitoneal, mesenteric and bilateral pelvic lymphadenopathy, mildly increased since 2012]. 2017 JAN- Infusion reaction to Rituxan [stopped];   # FEB 2017- IBRUTINIB 1 pill a day; FISH- POSITIVE FOR HOMOZYGOUS 13Q DELETION;  POSITIVE FOR 17P GENE DELETION; SEP 1st 2018- 495m/day;    # Molecular testing: Oct 2018- 70% OF NUCLEI POSITIVE FOR HOMOZYGOUS 13Q DELETION; 73% Of NUCLEI POSITIVE FOR TP53 GENE DELETION   # Poorly controlled DM/ cachexia/Left humerus fracture s/p fall [dec 2016]; pt in hospice [2016]      INTERVAL HISTORY: Poor/vague historian. He is alone today.   84year old male patient frail/multiple comorbidities with above history of recurrent CLL- currently rehabilitation is on Ibrutinib 420 mg [since sep 1st 2018 ]mg pill a day- Is here for follow-up.  Patient had a good holidays. The patient continues to be at the nursing home.  No falls.  No easy bruising.  No recent hospitalizations.  His appetite is stable.  No weight loss.  No nausea no vomiting.  No lightheadedness no fevers or chills.   He is accompanied by his son.  REVIEW OF SYSTEMS:  A complete 10 point review of system is done which is negative except mentioned above/history of present illness.   PAST MEDICAL HISTORY :  Past Medical History:  Diagnosis Date  . Anemia   . [place]ocytic [place]) ([facility]   . Diabetes mellitus without complication ([facility]   . Humerus fracture   . Leukemia ([facility]   . Neuromuscular disorder ([facility]    muscle weakness    PAST SURGICAL HISTORY :  No past surgical history on file.  FAMILY HISTORY :   Family History  Problem Relation Age of Onset  . Cancer Sister     SOCIAL HISTORY:   Social History   Tobacco Use  .  Smoking status: Never Smoker  . Smokeless tobacco: Never Used  Substance Use Topics  . Alcohol use: No  . Drug use: No    ALLERGIES:  has No Known Allergies.  MEDICATIONS:  Current Outpatient Medications  Medication Sig Dispense Refill  . aluminum-magnesium hydroxide-simethicone (MAALOX) 2403-474-25MG/5ML SUSP Take 30 mLs by mouth every 6 (six) hours as needed (for indigestion/heartburn).    . collagenase (SANTYL) ointment Apply 1 application topically daily.    . feeding supplement, GLUCERNA SHAKE, (GLUCERNA SHAKE) LIQD Take 237 mLs by mouth 4 (four) times daily -  with meals and at bedtime.    .Marland Kitchenglucagon (GLUCAGEN) 1 MG SOLR injection Inject 1 mg into the vein once as needed for low blood sugar.    . Ibrutinib 420 MG TABS Take 420 mg by mouth daily. 30 tablet 4  . insulin aspart (NOVOLOG) 100 UNIT/ML injection Inject 5 Units daily into the skin.     .Marland Kitcheninsulin detemir (LEVEMIR) 100 UNIT/ML injection Inject 10 Units into the skin at bedtime.     . Multiple Vitamin (MULTIVITAMIN) tablet Take 1 tablet by mouth daily.    . nitroGLYCERIN (NITROSTAT) 0.4 MG SL tablet Place 0.4 mg under the tongue every 5 (five) minutes x 3 doses as needed for chest pain.      No current facility-administered medications for this visit.     PHYSICAL EXAMINATION: ECOG PERFORMANCE STATUS: 3 - Symptomatic, >50% confined to  bed  BP (!) 112/54 (BP Location: Right Arm, Patient Position: Sitting)   Pulse 68   Temp 97.8 F (36.6 C) (Tympanic)   Resp 16   GENERAL: Cachectic appearing Caucasian male patient Alert, no distress and comfortable.   He is in a wheelchair. He is accompanied by his son.  EYES: no pallor or icterus;  OROPHARYNX: no thrush or ulceration;poor dentition. Marland Kitchen NECK: supple, no masses felt LYMPH: positive for palpable lymphadenopathy in the  bil axillary; inguinal regions cannot be examined LUNGS: clear to auscultation and  No wheeze or crackles HEART/CVS: regular rate & rhythm and no  murmurs; No lower extremity edema ABDOMEN:abdomen soft, non-tender and normal bowel sounds Musculoskeletal:no cyanosis of digits and no clubbing.  PSYCH: alert & oriented x 3  NEURO: no focal motor/sensory deficits SKIN:  no rashes or significant lesions   LABORATORY DATA:  I have reviewed the data as listed    Component Value Date/Time   NA 132 (L) 12/27/2017 0955   NA 143 06/04/2012 1240   K 4.1 12/27/2017 0955   K 4.2 06/04/2012 1240   CL 96 (L) 12/27/2017 0955   CL 106 06/04/2012 1240   CO2 29 12/27/2017 0955   CO2 29 06/04/2012 1240   GLUCOSE 246 (H) 12/27/2017 0955   GLUCOSE 208 (H) 06/04/2012 1240   BUN 21 (H) 12/27/2017 0955   BUN 26 (H) 06/04/2012 1240   CREATININE 0.53 (L) 12/27/2017 0955   CREATININE 0.82 06/04/2012 1240   CALCIUM 8.4 (L) 12/27/2017 0955   CALCIUM 8.6 06/04/2012 1240   PROT 6.4 (L) 12/27/2017 0955   PROT 6.0 (L) 06/04/2012 1240   ALBUMIN 3.1 (L) 12/27/2017 0955   ALBUMIN 3.4 06/04/2012 1240   AST 17 12/27/2017 0955   AST 11 (L) 06/04/2012 1240   ALT 15 (L) 12/27/2017 0955   ALT 16 06/04/2012 1240   ALKPHOS 154 (H) 12/27/2017 0955   ALKPHOS 79 06/04/2012 1240   BILITOT 0.8 12/27/2017 0955   BILITOT 1.1 (H) 06/04/2012 1240   GFRNONAA >60 12/27/2017 0955   GFRNONAA >60 06/04/2012 1240   GFRAA >60 12/27/2017 0955   GFRAA >60 06/04/2012 1240    No results found for: SPEP, UPEP  Lab Results  Component Value Date   WBC 23.2 (H) 12/27/2017   NEUTROABS 4.7 12/27/2017   HGB 13.8 12/27/2017   HCT 43.0 12/27/2017   MCV 82.6 12/27/2017   PLT 198 12/27/2017      Chemistry      Component Value Date/Time   NA 132 (L) 12/27/2017 0955   NA 143 06/04/2012 1240   K 4.1 12/27/2017 0955   K 4.2 06/04/2012 1240   CL 96 (L) 12/27/2017 0955   CL 106 06/04/2012 1240   CO2 29 12/27/2017 0955   CO2 29 06/04/2012 1240   BUN 21 (H) 12/27/2017 0955   BUN 26 (H) 06/04/2012 1240   CREATININE 0.53 (L) 12/27/2017 0955   CREATININE 0.82 06/04/2012 1240       Component Value Date/Time   CALCIUM 8.4 (L) 12/27/2017 0955   CALCIUM 8.6 06/04/2012 1240   ALKPHOS 154 (H) 12/27/2017 0955   ALKPHOS 79 06/04/2012 1240   AST 17 12/27/2017 0955   AST 11 (L) 06/04/2012 1240   ALT 15 (L) 12/27/2017 0955   ALT 16 06/04/2012 1240   BILITOT 0.8 12/27/2017 0955   BILITOT 1.1 (H) 06/04/2012 1240        ASSESSMENT & PLAN:  CLL (chronic lymphocytic leukemia) ([place]) # CLL-  17p del- patient is symptomatic currently on ibrutinib since feb 2017- clinical response noted. Currently on ibrutinib 420 [increased dose since 08/20/2017] mg/day; tolerating well-no evidence of any bruising or A. fib.  #  White count today 23,000 [ALC- 15] /STABLE; normal hemoglobin/platelets. If patient continues to progress on current therapy- the next line of therapy would be venetoclax+ Rituxan.   # Elevated alkaline phosphatase- question Ibrutinib- stable/improving.  Stable around 150.  # follow up in 2 months.  Labs.  Reviewed with the patient's son in detail.  Addendum: LFTs/alkaline phosphatase improving 154.    Cammie Sickle, MD 12/27/2017 11:04 AM

## 2017-12-29 ENCOUNTER — Other Ambulatory Visit: Payer: Self-pay | Admitting: *Deleted

## 2017-12-29 MED ORDER — IBRUTINIB 420 MG PO TABS: 420.0000 mg | ORAL_TABLET | Freq: Every day | ORAL | 4 refills | Status: AC

## 2018-01-02 ENCOUNTER — Telehealth: Payer: Self-pay | Admitting: Internal Medicine

## 2018-01-02 NOTE — Telephone Encounter (Signed)
Oral Oncology Patient Advocate Encounter   Received fax from [facility] for a refill on patients Imbruvica. Had Dr. Rogue Bussing sign and faxed back to [phone].   [facility] Patient Advocate [phone] 01/02/2018 4:43 PM

## 2018-01-31 ENCOUNTER — Emergency Department: Payer: Medicare Other

## 2018-01-31 ENCOUNTER — Encounter: Payer: Self-pay | Admitting: Emergency Medicine

## 2018-01-31 ENCOUNTER — Inpatient Hospital Stay
Admission: EM | Admit: 2018-01-31 | Discharge: 2018-02-09 | DRG: 871 | Disposition: A | Discharge status: Skilled Nursing Facility | Payer: Medicare Other | Attending: Internal Medicine | Admitting: Internal Medicine

## 2018-01-31 ENCOUNTER — Other Ambulatory Visit: Payer: Self-pay

## 2018-01-31 DIAGNOSIS — L8990 Pressure ulcer of unspecified site, unspecified stage: Secondary | ICD-10-CM

## 2018-01-31 DIAGNOSIS — D638 Anemia in other chronic diseases classified elsewhere: Secondary | ICD-10-CM | POA: Diagnosis present

## 2018-01-31 DIAGNOSIS — Z6821 Body mass index (BMI) 21.0-21.9, adult: Secondary | ICD-10-CM | POA: Diagnosis not present

## 2018-01-31 DIAGNOSIS — Z9981 Dependence on supplemental oxygen: Secondary | ICD-10-CM | POA: Diagnosis not present

## 2018-01-31 DIAGNOSIS — L89621 Pressure ulcer of left heel, stage 1: Secondary | ICD-10-CM | POA: Diagnosis present

## 2018-01-31 DIAGNOSIS — Z794 Long term (current) use of insulin: Secondary | ICD-10-CM

## 2018-01-31 DIAGNOSIS — E43 Unspecified severe protein-calorie malnutrition: Secondary | ICD-10-CM | POA: Diagnosis present

## 2018-01-31 DIAGNOSIS — I248 Other forms of acute ischemic heart disease: Secondary | ICD-10-CM | POA: Diagnosis present

## 2018-01-31 DIAGNOSIS — J9600 Acute respiratory failure, unspecified whether with hypoxia or hypercapnia: Secondary | ICD-10-CM

## 2018-01-31 DIAGNOSIS — Z7189 Other specified counseling: Secondary | ICD-10-CM | POA: Diagnosis not present

## 2018-01-31 DIAGNOSIS — E271 Primary adrenocortical insufficiency: Secondary | ICD-10-CM | POA: Diagnosis not present

## 2018-01-31 DIAGNOSIS — I959 Hypotension, unspecified: Secondary | ICD-10-CM | POA: Diagnosis not present

## 2018-01-31 DIAGNOSIS — J189 Pneumonia, unspecified organism: Secondary | ICD-10-CM | POA: Diagnosis not present

## 2018-01-31 DIAGNOSIS — J101 Influenza due to other identified influenza virus with other respiratory manifestations: Secondary | ICD-10-CM | POA: Diagnosis not present

## 2018-01-31 DIAGNOSIS — J1000 Influenza due to other identified influenza virus with unspecified type of pneumonia: Secondary | ICD-10-CM | POA: Diagnosis present

## 2018-01-31 DIAGNOSIS — J9621 Acute and chronic respiratory failure with hypoxia: Secondary | ICD-10-CM | POA: Diagnosis present

## 2018-01-31 DIAGNOSIS — E2740 Unspecified adrenocortical insufficiency: Secondary | ICD-10-CM | POA: Diagnosis present

## 2018-01-31 DIAGNOSIS — E876 Hypokalemia: Secondary | ICD-10-CM | POA: Diagnosis not present

## 2018-01-31 DIAGNOSIS — D6489 Other specified anemias: Secondary | ICD-10-CM | POA: Diagnosis present

## 2018-01-31 DIAGNOSIS — R627 Adult failure to thrive: Secondary | ICD-10-CM | POA: Diagnosis present

## 2018-01-31 DIAGNOSIS — A419 Sepsis, unspecified organism: Secondary | ICD-10-CM | POA: Diagnosis present

## 2018-01-31 DIAGNOSIS — J9601 Acute respiratory failure with hypoxia: Secondary | ICD-10-CM

## 2018-01-31 DIAGNOSIS — Y95 Nosocomial condition: Secondary | ICD-10-CM | POA: Diagnosis present

## 2018-01-31 DIAGNOSIS — Z66 Do not resuscitate: Secondary | ICD-10-CM | POA: Diagnosis present

## 2018-01-31 DIAGNOSIS — R0602 Shortness of breath: Secondary | ICD-10-CM | POA: Diagnosis present

## 2018-01-31 DIAGNOSIS — E872 Acidosis: Secondary | ICD-10-CM | POA: Diagnosis present

## 2018-01-31 DIAGNOSIS — L89152 Pressure ulcer of sacral region, stage 2: Secondary | ICD-10-CM | POA: Diagnosis present

## 2018-01-31 DIAGNOSIS — E11649 Type 2 diabetes mellitus with hypoglycemia without coma: Secondary | ICD-10-CM | POA: Diagnosis present

## 2018-01-31 DIAGNOSIS — E46 Unspecified protein-calorie malnutrition: Secondary | ICD-10-CM | POA: Diagnosis not present

## 2018-01-31 DIAGNOSIS — E2749 Other adrenocortical insufficiency: Secondary | ICD-10-CM | POA: Diagnosis not present

## 2018-01-31 DIAGNOSIS — Z515 Encounter for palliative care: Secondary | ICD-10-CM | POA: Diagnosis not present

## 2018-01-31 DIAGNOSIS — C9110 Chronic lymphocytic leukemia of B-cell type not having achieved remission: Secondary | ICD-10-CM | POA: Diagnosis present

## 2018-01-31 DIAGNOSIS — J1100 Influenza due to unidentified influenza virus with unspecified type of pneumonia: Secondary | ICD-10-CM | POA: Diagnosis not present

## 2018-01-31 DIAGNOSIS — E440 Moderate protein-calorie malnutrition: Secondary | ICD-10-CM | POA: Diagnosis not present

## 2018-01-31 DIAGNOSIS — J9690 Respiratory failure, unspecified, unspecified whether with hypoxia or hypercapnia: Secondary | ICD-10-CM

## 2018-01-31 LAB — CBC WITH DIFFERENTIAL/PLATELET
BASOS ABS: 0 10*3/uL (ref 0–0.1)
Basophils Relative: 0 %
EOS ABS: 0 10*3/uL (ref 0–0.7)
EOS PCT: 0 %
HCT: 38.8 % — ABNORMAL LOW (ref 40.0–52.0)
Hemoglobin: 12.3 g/dL — ABNORMAL LOW (ref 13.0–18.0)
Lymphocytes Relative: 38 %
Lymphs Abs: 8.7 10*3/uL — ABNORMAL HIGH (ref 1.0–3.6)
MCH: 25.9 pg — AB (ref 26.0–34.0)
MCHC: 31.8 g/dL — ABNORMAL LOW (ref 32.0–36.0)
MCV: 81.5 fL (ref 80.0–100.0)
Monocytes Absolute: 1.4 10*3/uL — ABNORMAL HIGH (ref 0.2–1.0)
Monocytes Relative: 6 %
Neutro Abs: 12.9 10*3/uL — ABNORMAL HIGH (ref 1.4–6.5)
Neutrophils Relative %: 56 %
Platelets: 339 10*3/uL (ref 150–440)
RBC: 4.76 MIL/uL (ref 4.40–5.90)
RDW: 15.8 % — ABNORMAL HIGH (ref 11.5–14.5)
WBC: 23 10*3/uL — AB (ref 3.8–10.6)

## 2018-01-31 LAB — PROCALCITONIN: Procalcitonin: <0.1 ng/mL

## 2018-01-31 LAB — GLUCOSE, CAPILLARY
GLUCOSE-CAPILLARY: 147 mg/dL — AB (ref 65–99)
GLUCOSE-CAPILLARY: 96 mg/dL (ref 65–99)

## 2018-01-31 LAB — PROTIME-INR
INR: 1.04
PROTHROMBIN TIME: 13.5 s (ref 11.4–15.2)

## 2018-01-31 LAB — COMPREHENSIVE METABOLIC PANEL
ALK PHOS: 141 U/L — AB (ref 38–126)
ALT: 13 U/L — ABNORMAL LOW (ref 17–63)
ANION GAP: 9 (ref 5–15)
AST: 29 U/L (ref 15–41)
Albumin: 2.6 g/dL — ABNORMAL LOW (ref 3.5–5.0)
BILIRUBIN TOTAL: 1.6 mg/dL — AB (ref 0.3–1.2)
BUN: 17 mg/dL (ref 6–20)
CALCIUM: 8.6 mg/dL — AB (ref 8.9–10.3)
CO2: 26 mmol/L (ref 22–32)
Chloride: 101 mmol/L (ref 101–111)
Creatinine, Ser: 0.66 mg/dL (ref 0.61–1.24)
GFR calc non Af Amer: >60 mL/min (ref >60)
Glucose, Bld: 173 mg/dL — ABNORMAL HIGH (ref 65–99)
Potassium: 4.4 mmol/L (ref 3.5–5.1)
Sodium: 136 mmol/L (ref 135–145)
TOTAL PROTEIN: 6.4 g/dL — AB (ref 6.5–8.1)

## 2018-01-31 LAB — TROPONIN I: Troponin I: 0.03 ng/mL (ref <0.03)

## 2018-01-31 LAB — LACTIC ACID, PLASMA
LACTIC ACID, VENOUS: 1.3 mmol/L (ref 0.5–1.9)
Lactic Acid, Venous: 2.7 mmol/L (ref 0.5–1.9)

## 2018-01-31 LAB — BRAIN NATRIURETIC PEPTIDE: B NATRIURETIC PEPTIDE 5: 102 pg/mL — AB (ref 0.0–100.0)

## 2018-01-31 LAB — MRSA PCR SCREENING: MRSA by PCR: POSITIVE — AB

## 2018-01-31 LAB — INFLUENZA PANEL BY PCR (TYPE A & B)
Influenza A By PCR: POSITIVE — AB
Influenza B By PCR: NEGATIVE

## 2018-01-31 LAB — LIPASE, BLOOD: LIPASE: 27 U/L (ref 11–51)

## 2018-01-31 MED ORDER — IPRATROPIUM-ALBUTEROL 0.5-2.5 (3) MG/3ML IN SOLN
3.0000 mL | RESPIRATORY_TRACT | Status: DC
  Administered 2018-01-31 – 2018-02-05 (×24): 3 mL via RESPIRATORY_TRACT
  Filled 2018-01-31 (×26): qty 3

## 2018-01-31 MED ORDER — VANCOMYCIN HCL IN DEXTROSE 1-5 GM/200ML-% IV SOLN
1000.0000 mg | INTRAVENOUS | Status: DC
  Administered 2018-01-31 – 2018-02-06 (×8): 1000 mg via INTRAVENOUS
  Filled 2018-01-31 (×9): qty 200

## 2018-01-31 MED ORDER — SODIUM CHLORIDE 0.9 % IV BOLUS (SEPSIS)
1000.0000 mL | Freq: Once | INTRAVENOUS | Status: AC
  Administered 2018-01-31: 1000 mL via INTRAVENOUS

## 2018-01-31 MED ORDER — ONDANSETRON HCL 4 MG/2ML IJ SOLN
4.0000 mg | Freq: Four times a day (QID) | INTRAMUSCULAR | Status: DC | PRN
  Administered 2018-02-01 – 2018-02-05 (×2): 4 mg via INTRAVENOUS
  Filled 2018-01-31 (×3): qty 2

## 2018-01-31 MED ORDER — ENOXAPARIN SODIUM 40 MG/0.4ML ~~LOC~~ SOLN
40.0000 mg | SUBCUTANEOUS | Status: DC
  Administered 2018-02-01 – 2018-02-08 (×8): 40 mg via SUBCUTANEOUS
  Filled 2018-01-31 (×8): qty 0.4

## 2018-01-31 MED ORDER — NITROGLYCERIN 0.4 MG SL SUBL: 0.4000 mg | SUBLINGUAL_TABLET | SUBLINGUAL | Status: DC | PRN

## 2018-01-31 MED ORDER — ADULT MULTIVITAMIN W/MINERALS CH
1.0000 | ORAL_TABLET | Freq: Every day | ORAL | Status: DC
  Administered 2018-02-03 – 2018-02-09 (×6): 1 via ORAL
  Filled 2018-01-31 (×8): qty 1

## 2018-01-31 MED ORDER — BUDESONIDE 0.5 MG/2ML IN SUSP
0.5000 mg | Freq: Two times a day (BID) | RESPIRATORY_TRACT | Status: DC
  Administered 2018-01-31 – 2018-02-06 (×11): 0.5 mg via RESPIRATORY_TRACT
  Filled 2018-01-31 (×14): qty 2

## 2018-01-31 MED ORDER — ACETAMINOPHEN 650 MG RE SUPP
RECTAL | Status: AC
  Administered 2018-01-31: 15:00:00
  Filled 2018-01-31: qty 1

## 2018-01-31 MED ORDER — MUPIROCIN 2 % EX OINT
1.0000 "application " | TOPICAL_OINTMENT | Freq: Two times a day (BID) | CUTANEOUS | Status: AC
  Administered 2018-01-31 – 2018-02-05 (×8): 1 via NASAL
  Filled 2018-01-31: qty 22

## 2018-01-31 MED ORDER — SODIUM CHLORIDE 0.9 % IV SOLN
INTRAVENOUS | Status: DC
  Administered 2018-01-31 – 2018-02-03 (×4): via INTRAVENOUS

## 2018-01-31 MED ORDER — SODIUM CHLORIDE 0.9 % IV SOLN
2.0000 g | Freq: Two times a day (BID) | INTRAVENOUS | Status: DC
  Administered 2018-01-31 – 2018-02-05 (×11): 2 g via INTRAVENOUS
  Filled 2018-01-31 (×12): qty 2

## 2018-01-31 MED ORDER — OSELTAMIVIR PHOSPHATE 75 MG PO CAPS
75.0000 mg | ORAL_CAPSULE | Freq: Two times a day (BID) | ORAL | Status: DC
  Administered 2018-02-01: 75 mg via ORAL
  Filled 2018-01-31 (×2): qty 1

## 2018-01-31 MED ORDER — CHLORHEXIDINE GLUCONATE CLOTH 2 % EX PADS
6.0000 | MEDICATED_PAD | Freq: Every day | CUTANEOUS | Status: AC
  Administered 2018-02-01 – 2018-02-05 (×4): 6 via TOPICAL

## 2018-01-31 MED ORDER — VANCOMYCIN HCL IN DEXTROSE 1-5 GM/200ML-% IV SOLN
1000.0000 mg | Freq: Once | INTRAVENOUS | Status: AC
  Administered 2018-01-31: 1000 mg via INTRAVENOUS
  Filled 2018-01-31: qty 200

## 2018-01-31 MED ORDER — ACETAMINOPHEN 325 MG PO TABS: 650.0000 mg | ORAL_TABLET | Freq: Four times a day (QID) | ORAL | Status: DC | PRN

## 2018-01-31 MED ORDER — ALUM & MAG HYDROXIDE-SIMETH 200-200-20 MG/5ML PO SUSP
30.0000 mL | Freq: Four times a day (QID) | ORAL | Status: DC | PRN
  Filled 2018-01-31: qty 30

## 2018-01-31 MED ORDER — ACETAMINOPHEN 650 MG RE SUPP: 650.0000 mg | Freq: Four times a day (QID) | RECTAL | Status: DC | PRN

## 2018-01-31 MED ORDER — INSULIN ASPART 100 UNIT/ML ~~LOC~~ SOLN
0.0000 [IU] | Freq: Three times a day (TID) | SUBCUTANEOUS | Status: DC
  Administered 2018-01-31: 1 [IU] via SUBCUTANEOUS
  Administered 2018-02-01 – 2018-02-02 (×2): 2 [IU] via SUBCUTANEOUS
  Administered 2018-02-03 (×2): 1 [IU] via SUBCUTANEOUS
  Filled 2018-01-31 (×5): qty 1

## 2018-01-31 MED ORDER — TRAMADOL HCL 50 MG PO TABS: 50.0000 mg | ORAL_TABLET | Freq: Four times a day (QID) | ORAL | Status: DC | PRN

## 2018-01-31 MED ORDER — IBRUTINIB 420 MG PO TABS
420.0000 mg | ORAL_TABLET | Freq: Every day | ORAL | Status: DC
  Administered 2018-02-01: 420 mg via ORAL
  Filled 2018-01-31 (×2): qty 1

## 2018-01-31 MED ORDER — POLYETHYLENE GLYCOL 3350 17 G PO PACK
17.0000 g | PACK | Freq: Every day | ORAL | Status: DC | PRN
  Administered 2018-02-06: 17 g via ORAL
  Filled 2018-01-31: qty 1

## 2018-01-31 MED ORDER — SODIUM CHLORIDE 0.9 % IV SOLN
2.0000 g | Freq: Once | INTRAVENOUS | Status: AC
  Administered 2018-01-31: 2 g via INTRAVENOUS
  Filled 2018-01-31: qty 2

## 2018-01-31 MED ORDER — ONDANSETRON HCL 4 MG PO TABS: 4.0000 mg | ORAL_TABLET | Freq: Four times a day (QID) | ORAL | Status: DC | PRN

## 2018-01-31 NOTE — Consult Note (Signed)
Name: Wesley Mcguire MRN: [id number] DOB: 10/02/1931    ADMISSION DATE:  01/31/2018 CONSULTATION DATE: 01/31/2018  REFERRING MD : Dr. Benjie Karvonen   CHIEF COMPLAINT: Shortness of Breath   BRIEF PATIENT DESCRIPTION: 84 yo male admitted with acute on chronic hypoxic respiratory failure secondary to influenza A and LLL pneumonia requiring Bipap   SIGNIFICANT EVENTS  02/12-Pt admitted to stepdown unit   STUDIES:  None   HISTORY OF PRESENT ILLNESS:   This is an 84 yo male with a PMH of Neuromuscular Disorder, Leukemia, Humerus Fracture, Diabetes Mellitus, CLL, and Anemia.  He presented to [facility] ER via EMS 02/12 from [facility] with shortness of breath, cough, confusion, and hypoxia.  According to EMS the pt was hypoxic with O2 sats 80% on 2L O2, therefore he was placed on NRB O2 sats increased to 92%.  It was also noted the pt had audible rales, tachypneic and sinus tach on cardiac monitor.  In the ER pt positive for influenza A and CXR revealed LLL pneumonia.  Due to worsening respiratory failure he was transitioned to Bipap. The pt was recently treated for pneumonia and started on tamiflu at [facility] around 01/25/18.  He was subsequently admitted to the stepdown unit by hospitalist team for further workup and treatment.    PAST MEDICAL HISTORY :   has a past medical history of Anemia, CLL (chronic lymphocytic leukemia) ([place]), Diabetes mellitus without complication ([employer]), Humerus fracture, Leukemia ([place]), and Neuromuscular disorder ([place]).  has no past surgical history on file. Prior to Admission medications   Medication Sig Start Date End Date Taking? Authorizing Provider  guaiFENesin (MUCINEX) 600 MG 12 hr tablet Take 600 mg by mouth 2 (two) times daily.   Yes [provider]  Ibrutinib 420 MG TABS Take 420 mg by mouth daily. 12/29/17  Yes Cammie Sickle, MD  insulin aspart (NOVOLOG) 100 UNIT/ML injection Inject 5 Units daily into the skin.    Yes [provider]  insulin detemir (LEVEMIR) 100 UNIT/ML injection Inject 10 Units into the skin at bedtime.    Yes [provider]  Multiple Vitamin (MULTIVITAMIN) tablet Take 1 tablet by mouth daily.   Yes [provider]  oseltamivir (TAMIFLU) 75 MG capsule Take 75 mg by mouth daily.   Yes [provider]  aluminum-magnesium hydroxide-simethicone (MAALOX) 200-200-20 MG/5ML SUSP Take 30 mLs by mouth every 6 (six) hours as needed (for indigestion/heartburn).    [provider]  collagenase (SANTYL) ointment Apply 1 application topically daily.    [provider]  feeding supplement, GLUCERNA SHAKE, (GLUCERNA SHAKE) LIQD Take 237 mLs by mouth 4 (four) times daily -  with meals and at bedtime.    [provider]  glucagon (GLUCAGEN) 1 MG SOLR injection Inject 1 mg into the vein once as needed for low blood sugar.    [provider]  nitroGLYCERIN (NITROSTAT) 0.4 MG SL tablet Place 0.4 mg under the tongue every 5 (five) minutes x 3 doses as needed for chest pain.     [provider]   No Known Allergies  FAMILY HISTORY:  family history includes Cancer in his sister. SOCIAL HISTORY:  reports that  has never smoked. he has never used smokeless tobacco. He reports that he does not drink alcohol or use drugs.  REVIEW OF SYSTEMS: Positives in BOLD  Constitutional: Negative for fever, chills, weight loss, malaise/fatigue and diaphoresis.  HENT: Negative for hearing loss, ear pain, nosebleeds, congestion, sore throat, neck  pain, tinnitus and ear discharge.   Eyes: Negative for blurred vision, double vision, photophobia, pain, discharge and redness.  Respiratory: cough, hemoptysis, sputum production, shortness of breath, wheezing and stridor.   Cardiovascular: Negative for chest pain, palpitations, orthopnea, claudication, leg swelling and PND.  Gastrointestinal: Negative for heartburn, nausea, vomiting, abdominal pain, diarrhea,  constipation, blood in stool and melena.  Genitourinary: Negative for dysuria, urgency, frequency, hematuria and flank pain.  Musculoskeletal: Negative for myalgias, back pain, joint pain and falls.  Skin: Negative for itching and rash.  Neurological: Negative for dizziness, tingling, tremors, sensory change, speech change, focal weakness, seizures, loss of consciousness, weakness and headaches.  Endo/Heme/Allergies: Negative for environmental allergies and polydipsia. Does not bruise/bleed easily.  SUBJECTIVE:  Pt states he is still short of breath.  VITAL SIGNS: Temp:  [103.2 F (39.6 C)] 103.2 F (39.6 C) (02/12 1454) Pulse Rate:  [95-136] 95 (02/12 1800) Resp:  [20-38] 20 (02/12 1800) BP: (86-122)/(33-69) 86/58 (02/12 1800) SpO2:  [92 %-100 %] 99 % (02/12 1800) FiO2 (%):  [60 %] 60 % (02/12 1800) Weight:  [58.2 kg (128 lb 4.9 oz)-59 kg (130 lb)] 58.2 kg (128 lb 4.9 oz) (02/12 1800)  PHYSICAL EXAMINATION: General: acutely ill appearing male, NAD on Bipap  Neuro: alert oriented to self only, follows commands  HEENT: supple, no JVD  Cardiovascular: sinus rhythm, rrr, no M/R/G Lungs: faint rhonchi throughout, even, non labored  Abdomen: +BS x4, soft, non tender, non distended  Musculoskeletal: normal bulk and tone, no edema  Skin: stage II pressure ulcer sacral spine, stage I pressure ulcer left heel, and deep tissue injury right heel   Recent Labs  Lab 01/31/18 1454  NA 136  K 4.4  CL 101  CO2 26  BUN 17  CREATININE 0.66  GLUCOSE 173*   Recent Labs  Lab 01/31/18 1454  HGB 12.3*  HCT 38.8*  WBC 23.0*  PLT 339   Dg Chest Portable 1 View  Result Date: 01/31/2018 CLINICAL DATA:  Pneumonia. EXAM: PORTABLE CHEST 1 VIEW COMPARISON:  01/12/2016 FINDINGS: 1526 hours. The cardiopericardial silhouette is within normal limits for size. Calcified lymph nodes seen in the AP window and left hilum. Left base airspace disease is suspicious for pneumonia. No focal consolidation  evident in the right lung. Interstitial markings are diffusely coarsened with chronic features. Gas distended bowel under the right hemidiaphragm is similar to prior. Posttraumatic deformity noted left proximal humerus. Bones are diffusely demineralized. Telemetry leads overlie the chest. IMPRESSION: Patchy airspace disease left base suspicious for pneumonia. Electronically Signed   By: Misty Stanley M.D.   On: 01/31/2018 15:41    ASSESSMENT / PLAN: Acute on chronic hypoxic respiratory failure secondary to influenza A and LLL pneumonia  Lactic Acidosis-resolved Mildly elevated troponin likely demand ischemia in setting of respiratory failure  Anemia without obvious acute blood loss Pressure Ulcers   Hx: CLL and Diabetes Mellitus  P: Prn Bipap for dyspnea and/or hypoxia  Repeat CXR in am Continue scheduled bronchodilator therapy  Continue nebulized steroids Droplet precautions  Continuous telemetry monitoring  Trend WBC and monitor fever curve Trend PCT Follow cultures  Continue current abx Lovenox for VTE prophylaxis  Trend CBC  Monitor for s/sx of bleeding and transfuse for hgb <7 SSI  Turn pt q2hrs   Marda Stalker, [place] Pager [phone] (please enter 7 digits) PCCM Consult Pager [phone] (please enter 7 digits)

## 2018-01-31 NOTE — ED Provider Notes (Signed)
[facility] Emergency Department Provider Note  ____________________________________________   First MD Initiated Contact with Patient 01/31/18 1504     (approximate)  I have reviewed the triage vital signs and the nursing notes.   HISTORY  Chief Complaint Respiratory Distress and Code Sepsis  Level 5 caveat:  history/ROS limited by acute/critical illness  HPI Wesley Mcguire is a 84 y.o. male with extensive chronic and past medical history and who reportedly was recently treated for pneumonia and started on Tamiflu about 6 days ago (empirically?)  Who presents by EMS for severe respiratory distress and hypoxemia.  He is on 2 L of oxygen by nasal cannula normally but when EMS arrived to the [place] [facility] he was 80% with labored breathing.  On a nonrebreather his oxygenation comes up to about 82%.  His baseline is uncertain but currently he is confused and has a persistent cough, tachypnea, tachycardia, and is ill-appearing.  No additional details are available at this time and family is not yet present.  Past Medical History:  Diagnosis Date  . Anemia   . CLL (chronic lymphocytic leukemia) ([place])   . Diabetes mellitus without complication ([place])   . Humerus fracture   . Leukemia ([place])   . Neuromuscular disorder ([place])    muscle weakness    Patient Active Problem List   Diagnosis Date Noted  . Influenza A 01/31/2018  . CLL (chronic lymphocytic leukemia) ([place]) 12/27/2017  . Difficulty in urination 06/28/2017  . Prostate enlargement 06/28/2017  . Protein-calorie malnutrition, severe 11/22/2015  . Hyperkalemia 11/21/2015    History reviewed. No pertinent surgical history.  Prior to Admission medications   Medication Sig Start Date End Date Taking? Authorizing Provider  guaiFENesin (MUCINEX) 600 MG 12 hr tablet Take 600 mg by mouth 2 (two) times daily.   Yes [provider]  Ibrutinib 420 MG TABS Take 420 mg by mouth daily.  12/29/17  Yes Cammie Sickle, MD  insulin aspart (NOVOLOG) 100 UNIT/ML injection Inject 5 Units daily into the skin.    Yes [provider]  insulin detemir (LEVEMIR) 100 UNIT/ML injection Inject 10 Units into the skin at bedtime.    Yes [provider]  Multiple Vitamin (MULTIVITAMIN) tablet Take 1 tablet by mouth daily.   Yes [provider]  oseltamivir (TAMIFLU) 75 MG capsule Take 75 mg by mouth daily.   Yes [provider]  aluminum-magnesium hydroxide-simethicone (MAALOX) 200-200-20 MG/5ML SUSP Take 30 mLs by mouth every 6 (six) hours as needed (for indigestion/heartburn).    [provider]  collagenase (SANTYL) ointment Apply 1 application topically daily.    [provider]  feeding supplement, GLUCERNA SHAKE, (GLUCERNA SHAKE) LIQD Take 237 mLs by mouth 4 (four) times daily -  with meals and at bedtime.    [provider]  glucagon (GLUCAGEN) 1 MG SOLR injection Inject 1 mg into the vein once as needed for low blood sugar.    [provider]  nitroGLYCERIN (NITROSTAT) 0.4 MG SL tablet Place 0.4 mg under the tongue every 5 (five) minutes x 3 doses as needed for chest pain.     [provider]    Allergies Patient has no known allergies.  Family History  Problem Relation Age of Onset  . Cancer Sister     Social History Social History   Tobacco Use  . Smoking status: Never Smoker  . Smokeless tobacco: Never Used  Substance Use Topics  . Alcohol use: No  .  Drug use: No    Review of Systems Level 5 caveat:  history/ROS limited by acute/critical illness ____________________________________________   PHYSICAL EXAM:  VITAL SIGNS: ED Triage Vitals  Enc Vitals Group     BP 01/31/18 1454 122/69     Pulse Rate 01/31/18 1454 (!) 132     Resp 01/31/18 1454 (!) 38     Temp 01/31/18 1454 (!) 103.2 F (39.6 C)     Temp Source 01/31/18 1454 Rectal     SpO2 01/31/18 1445 92 %     Weight  01/31/18 1455 59 kg (130 lb)     Height --      Head Circumference --      Peak Flow --      Pain Score --      Pain Loc --      Pain Edu? --      Excl. in [place]? --     Constitutional: Elderly and cachectic, has the appearance of acute and toxic illness in the setting of severe chronic illness Eyes: Conjunctivae are normal.  Head: Atraumatic. Nose: No congestion/rhinnorhea. Mouth/Throat: Mucous membranes are dry Neck: No stridor.  No meningeal signs.   Cardiovascular: Moderate tachycardia, regular rhythm. Good peripheral circulation. Grossly normal heart sounds. Respiratory: Increased respiratory effort with intercostal retractions and accessory muscle usage.  Coarse and thick breath sounds throughout all lung fields.  Tachypnea in the upper 30s-40s. Gastrointestinal: Soft with diffuse tenderness throughout but no rebound and no guarding. No distention.  Musculoskeletal: Cachectic extremities.  No lower extremity tenderness nor edema. No gross deformities of extremities. Neurologic: Patient is confused and oriented only to self.  Unable to participate in neuro exam although he is moving all 4 extremities.  No gross evidence of acute infarction/CVA Skin:  Skin is warm, pale, dry and intact. No rash noted.   ____________________________________________   LABS (all labs ordered are listed, but only abnormal results are displayed)  Labs Reviewed  COMPREHENSIVE METABOLIC PANEL - Abnormal; Notable for the following components:      Result Value   Glucose, Bld 173 (*)    Calcium 8.6 (*)    Total Protein 6.4 (*)    Albumin 2.6 (*)    ALT 13 (*)    Alkaline Phosphatase 141 (*)    Total Bilirubin 1.6 (*)    All other components within normal limits  LACTIC ACID, PLASMA - Abnormal; Notable for the following components:   Lactic Acid, Venous 2.7 (*)    All other components within normal limits  CBC WITH DIFFERENTIAL/PLATELET - Abnormal; Notable for the following components:   WBC 23.0  (*)    Hemoglobin 12.3 (*)    HCT 38.8 (*)    MCH 25.9 (*)    MCHC 31.8 (*)    RDW 15.8 (*)    Neutro Abs 12.9 (*)    Lymphs Abs 8.7 (*)    Monocytes Absolute 1.4 (*)    All other components within normal limits  INFLUENZA PANEL BY PCR (TYPE A & B) - Abnormal; Notable for the following components:   Influenza A By PCR POSITIVE (*)    All other components within normal limits  CULTURE, BLOOD (ROUTINE X 2)  CULTURE, BLOOD (ROUTINE X 2)  URINE CULTURE  PROTIME-INR  LACTIC ACID, PLASMA  URINALYSIS, COMPLETE (UACMP) WITH MICROSCOPIC  LIPASE, BLOOD  BRAIN NATRIURETIC PEPTIDE  TROPONIN I  PROCALCITONIN  CBC  CREATININE, SERUM  HEMOGLOBIN M0N  BASIC METABOLIC PANEL  CBC   ____________________________________________  EKG  ED ECG REPORT I, Hinda Kehr, the attending physician, personally viewed and interpreted this ECG.  Date: 01/31/2018 EKG Time: 15:53 Rate:  Rhythm: normal sinus rhythm QRS Axis: normal Intervals: normal ST/T Wave abnormalities: Non-specific ST segment / T-wave changes, but no evidence of acute ischemia. Narrative Interpretation: no evidence of acute ischemia   ____________________________________________  RADIOLOGY I, Hinda Kehr, personally viewed and evaluated these images (plain radiographs) as part of my medical decision making, as well as reviewing the written report by the radiologist.  ED MD interpretation: Left lower lobe pneumonia  Official radiology report(s): Dg Chest Portable 1 View  Result Date: 01/31/2018 CLINICAL DATA:  Pneumonia. EXAM: PORTABLE CHEST 1 VIEW COMPARISON:  01/12/2016 FINDINGS: 1526 hours. The cardiopericardial silhouette is within normal limits for size. Calcified lymph nodes seen in the AP window and left hilum. Left base airspace disease is suspicious for pneumonia. No focal consolidation evident in the right lung. Interstitial markings are diffusely coarsened with chronic features. Gas distended bowel under the right  hemidiaphragm is similar to prior. Posttraumatic deformity noted left proximal humerus. Bones are diffusely demineralized. Telemetry leads overlie the chest. IMPRESSION: Patchy airspace disease left base suspicious for pneumonia. Electronically Signed   By: Misty Stanley M.D.   On: 01/31/2018 15:41    ____________________________________________   PROCEDURES  Critical Care performed: Yes, see critical care procedure note(s)   Procedure(s) performed:   .Critical Care Performed by: Hinda Kehr, MD Authorized by: Hinda Kehr, MD   Critical care provider statement:    Critical care time (minutes):  45   Critical care time was exclusive of:  Separately billable procedures and treating other patients   Critical care was necessary to treat or prevent imminent or life-threatening deterioration of the following conditions:  Sepsis and respiratory failure   Critical care was time spent personally by me on the following activities:  Development of treatment plan with patient or surrogate, discussions with consultants, evaluation of patient's response to treatment, examination of patient, obtaining history from patient or surrogate, ordering and performing treatments and interventions, ordering and review of laboratory studies, ordering and review of radiographic studies, pulse oximetry, re-evaluation of patient's condition and review of old charts      ____________________________________________   INITIAL IMPRESSION / ASSESSMENT AND PLAN / ED COURSE  As part of my medical decision making, I reviewed the following data within the electronic MEDICAL RECORD NUMBER History obtained from family, Nursing notes reviewed and incorporated, Labs reviewed , EKG interpreted , Old chart reviewed, Radiograph reviewed  and Discussed with admitting physician     Differential diagnosis includes, but is not limited to, healthcare associated pneumonia, sepsis, PE, CHF, COPD, ACS.  The patient is febrile,  tachypneic, tachycardic, and hypoxic.  I have ordered all of the code sepsis plan including 30 mL/kg grams of IV fluids and empiric antibiotics for healthcare associated pneumonia.  The patient appears toxic and given his cachexia and degree of chronic illness I am concerned about his survivability.  He is unable to provide any additional history.  His son is reportedly on the way and I will discuss any advanced directives that may be in place but currently no paperwork indicating DNR/DNI was sent with the patient.  Given his difficulty with oxygenation I have asked respiratory therapy to start the patient on BiPAP at least as a bridge until we determine if intubation will be required.  Clinical Course as of Jan 31 1707  Tue Jan 31, 2018  1551 Speaking  right now with [name], the patient's son and HCPOA  [CF]  1600 Lactic Acid, Venous: (!!) 2.7 [CF]  1600 Influenza A By PCR: (!) POSITIVE [CF]  1600 LLL Pneumonia DG Chest Portable 1 View [CF]  1603 Discussed by phone the situation with the patient's son who is the healthcare power of attorney. he believes his father would want to be made DNR/DNI given the degree of chronic illness from which the patient suffers and how acutely ill he is at this time.  I also verified this at the bedside with the patient who nodded in understanding when I explained to him how acutely ill he is currently, and that we would do everything we can to treat him and make him better but we will not put him on a ventilator and not do chest compressions.  He said yes and acknowledge that is his wish.  I filled out the DNR/DNI form and put the order in the computer.  I spoke in person with Dr. Benjie Karvonen with the hospitalist service who will admit.  She evaluated the patient at bedside with me.  [CF]    Clinical Course User Index [CF] Hinda Kehr, MD    ____________________________________________  FINAL CLINICAL IMPRESSION(S) / ED DIAGNOSES  Final diagnoses:  Acute respiratory  failure with hypoxia ([place])  Sepsis, due to unspecified organism ([place])  HCAP (healthcare-associated pneumonia)  Influenza A     MEDICATIONS GIVEN DURING THIS VISIT:  Medications  nitroGLYCERIN (NITROSTAT) SL tablet 0.4 mg (not administered)  aluminum-magnesium hydroxide-simethicone (MAALOX) 200-200-20 MG/5ML suspension 30 mL (not administered)  multivitamin tablet 1 tablet (not administered)  Ibrutinib TABS 420 mg (not administered)  insulin aspart (novoLOG) injection 0-9 Units (not administered)  enoxaparin (LOVENOX) injection 40 mg (not administered)  0.9 %  sodium chloride infusion (not administered)  acetaminophen (TYLENOL) tablet 650 mg (not administered)    Or  acetaminophen (TYLENOL) suppository 650 mg (not administered)  polyethylene glycol (MIRALAX / GLYCOLAX) packet 17 g (not administered)  traMADol (ULTRAM) tablet 50 mg (not administered)  ondansetron (ZOFRAN) tablet 4 mg (not administered)    Or  ondansetron (ZOFRAN) injection 4 mg (not administered)  oseltamivir (TAMIFLU) capsule 75 mg (not administered)  ipratropium-albuterol (DUONEB) 0.5-2.5 (3) MG/3ML nebulizer solution 3 mL (not administered)  budesonide (PULMICORT) nebulizer solution 0.5 mg (not administered)  acetaminophen (TYLENOL) 650 MG suppository (  Given 01/31/18 1505)  sodium chloride 0.9 % bolus 1,000 mL (0 mLs Intravenous Stopped 01/31/18 1619)    And  sodium chloride 0.9 % bolus 1,000 mL (1,000 mLs Intravenous Transfusing/Transfer 01/31/18 1650)  ceFEPIme (MAXIPIME) 2 g in sodium chloride 0.9 % 100 mL IVPB (0 g Intravenous Stopped 01/31/18 1557)  vancomycin (VANCOCIN) IVPB 1000 mg/200 mL premix (0 mg Intravenous Stopped 01/31/18 1700)     ED Discharge Orders    None       Note:  This document was prepared using Dragon voice recognition software and may include unintentional dictation errors.    Hinda Kehr, MD 01/31/18 [phone]

## 2018-01-31 NOTE — ED Notes (Signed)
Code Sepsis Initiated.

## 2018-01-31 NOTE — Progress Notes (Signed)
Assisted with patient transport for ER to ICU 17 while patient on V60 Bipap with no complications.

## 2018-01-31 NOTE — Progress Notes (Signed)
Pharmacy Antibiotic Note  Wesley Mcguire is a 84 y.o. male admitted on 01/31/2018 with pneumonia.  Pharmacy has been consulted for cefepime + vancomycin dosing.  Plan: Cefepime 2 g IV q12h  Vancomycin 1000 mg IV dose given in ED. Will order vancomycin 1000 mg IV q18h Goal VT 15-20 mcg/mL  Kinetics: Actual body weight = 58 kg, CrCl = 60 mL/min Ke: 0.054 Half-life: 13 hrs Vd: 48 L Cmin ~15 mcg/mL  Height: 5\' 10"  (177.8 cm) Weight: 128 lb 4.9 oz (58.2 kg) IBW/kg (Calculated) : 73  Temp (24hrs), Avg:103.2 F (39.6 C), Min:103.2 F (39.6 C), Max:103.2 F (39.6 C)  Recent Labs  Lab 01/31/18 1454 01/31/18 1654  WBC 23.0*  --   CREATININE 0.66  --   LATICACIDVEN 2.7* 1.3    Estimated Creatinine Clearance: 56.6 mL/min (by C-G formula based on SCr of 0.66 mg/dL).    No Known Allergies  Antimicrobials this admission: vancomycin 2/12 >>  cefepime 2/12 >>   Dose adjustments this admission:  Microbiology results: 2/12 BCx: Sent 2/12 UCx: Sent  2/12 MRSA PCR: Sent  Thank you for allowing pharmacy to be a part of this patient's care.  Lenis Noon, PharmD, BCPS Clinical Pharmacist 01/31/2018 6:37 PM

## 2018-01-31 NOTE — ED Triage Notes (Signed)
Pt to ED via EMS from [facility] recently treated for pneumonia and started on tamiflu around 01/25/18.  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  EMS placed patient on 15L NRB sats up to 92%.  EMS reports rails throughout lungs, ST.  Pt presents confused, consistent cough, labored breathing, tachypnea.

## 2018-01-31 NOTE — Progress Notes (Signed)
CODE SEPSIS - PHARMACY COMMUNICATION  **[facility] Spectrum Antibiotics should be administered within 1 hour of Sepsis diagnosis**  Time Code Sepsis Called/Page Received: 1530  Antibiotics Ordered: cefepime + vancomycin  Time of 1st antibiotic administration: [place] ,PharmD Clinical Pharmacist  01/31/2018  3:31 PM

## 2018-01-31 NOTE — H&P (Signed)
[facility] [place] NAME: Wesley Mcguire    MR#:  [id number]  DATE OF BIRTH:  1930-12-27  DATE OF ADMISSION:  01/31/2018  PRIMARY CARE PHYSICIAN: Dr. Starleen Arms   REQUESTING/REFERRING PHYSICIAN: dr Karma Greaser  CHIEF COMPLAINT:   Shortness of breath and hypoxia HISTORY OF PRESENT ILLNESS:  Wesley Mcguire  is a 84 y.o. male with a known history of CLL followed by oncology, diabetes and anemia of chronic disease who presents today from [place] with shortness of breath and hypoxia. Patient was recently treated for pneumonia and started on Tamiflu on February 6. He is on 2 L of chronic oxygen however when EMS arrived to this facility patient was found to have 80% oxygen saturations on his baseline oxygen. Nonrebreather was placed and his O2 sat was around 90-92%. Patient was confused and had persistent cough with labored breathing and tachypnea. He comes to the ER with cough, shortness of breath. Patient denies fever or chills. Patient does appear a bit confused. Wife is at bedside. ED physician spoke with patient's power of attorney and power of attorney who is patient's son has decided on DO NOT RESUSCITATE status. Patient was placed on BiPAP due to acute hypoxic respiratory failure and dyspnea. Patient seems to be improving with BiPAP. Oxygen saturation is 94%.  Influenza A testing was positive. Chest x-ray does show a pneumonia. Patient is started on Tamiflu as well as vancomycin and cefepime. PAST MEDICAL HISTORY:   Past Medical History:  Diagnosis Date  . Anemia   . CLL (chronic lymphocytic leukemia) ([place])   . Diabetes mellitus without complication ([place])   . Humerus fracture   . Leukemia ([place])   . Neuromuscular disorder ([place])    muscle weakness    PAST SURGICAL HISTORY:  History reviewed. No pertinent surgical history.  SOCIAL HISTORY:   Social History   Tobacco Use  . Smoking status: Never Smoker  . Smokeless tobacco:  Never Used  Substance Use Topics  . Alcohol use: No    FAMILY HISTORY:   Family History  Problem Relation Age of Onset  . Cancer Sister     DRUG ALLERGIES:  No Known Allergies  REVIEW OF SYSTEMS:   Review of Systems  Constitutional: Positive for malaise/fatigue. Negative for chills and fever.  HENT: Negative.  Negative for ear discharge, ear pain, hearing loss, nosebleeds and sore throat.   Eyes: Negative.  Negative for blurred vision and pain.  Respiratory: Positive for cough, shortness of breath and wheezing. Negative for hemoptysis.   Cardiovascular: Negative.  Negative for chest pain, palpitations and leg swelling.  Gastrointestinal: Negative.  Negative for abdominal pain, blood in stool, diarrhea, nausea and vomiting.  Genitourinary: Negative.  Negative for dysuria.  Musculoskeletal: Negative.  Negative for back pain.  Skin: Negative.   Neurological: Positive for weakness. Negative for dizziness, tremors, speech change, focal weakness, seizures and headaches.  Endo/Heme/Allergies: Negative.  Does not bruise/bleed easily.  Psychiatric/Behavioral: Negative.  Negative for depression, hallucinations and suicidal ideas.    MEDICATIONS AT HOME:   Prior to Admission medications   Medication Sig Start Date End Date Taking? Authorizing Provider  aluminum-magnesium hydroxide-simethicone (MAALOX) 151-761-60 MG/5ML SUSP Take 30 mLs by mouth every 6 (six) hours as needed (for indigestion/heartburn).    [provider]  collagenase (SANTYL) ointment Apply 1 application topically daily.    [provider]  feeding supplement, GLUCERNA SHAKE, (GLUCERNA SHAKE) LIQD Take 237 mLs by mouth 4 (four) times daily -  with meals and at bedtime.    [provider]  glucagon (GLUCAGEN) 1 MG SOLR injection Inject 1 mg into the vein once as needed for low blood sugar.    [provider]  Ibrutinib 420 MG TABS Take 420 mg by mouth daily. 12/29/17   Cammie Sickle, MD  insulin aspart (NOVOLOG) 100 UNIT/ML injection Inject 5 Units daily into the skin.     [provider]  insulin detemir (LEVEMIR) 100 UNIT/ML injection Inject 10 Units into the skin at bedtime.     [provider]  Multiple Vitamin (MULTIVITAMIN) tablet Take 1 tablet by mouth daily.    [provider]  nitroGLYCERIN (NITROSTAT) 0.4 MG SL tablet Place 0.4 mg under the tongue every 5 (five) minutes x 3 doses as needed for chest pain.     [provider]      VITAL SIGNS:  Blood pressure 122/69, pulse (!) 132, temperature (!) 103.2 F (39.6 C), temperature source Rectal, resp. rate (!) 38, weight 59 kg (130 lb), SpO2 92 %.  PHYSICAL EXAMINATION:   Physical Exam  Constitutional: No distress.  Patient is very frail and chronically ill-appearing  HENT:  Head: Normocephalic.  Eyes: No scleral icterus.  Neck: Normal range of motion. Neck supple. No JVD present. No tracheal deviation present.  Cardiovascular: Normal rate, regular rhythm and normal heart sounds. Exam reveals no gallop and no friction rub.  No murmur heard. Pulmonary/Chest: No respiratory distress. He has no wheezes. He has no rales. He exhibits no tenderness.  Bilateral rhonchi no wheezing He is wearing BiPAP  Abdominal: Soft. Bowel sounds are normal. He exhibits no distension and no mass. There is no tenderness. There is no rebound and no guarding.  Musculoskeletal: Normal range of motion. He exhibits tenderness. He exhibits no edema or deformity.  Neurological: He is alert.  Oriented to name but not place or time  Skin: Skin is warm. No rash noted. No erythema.  Psychiatric:  Confusion      LABORATORY PANEL:   CBC Recent Labs  Lab 01/31/18 1454  WBC 23.0*  HGB 12.3*  HCT 38.8*  PLT 339   ------------------------------------------------------------------------------------------------------------------  Chemistries  Recent Labs  Lab 01/31/18 1454  NA 136   K 4.4  CL 101  CO2 26  GLUCOSE 173*  BUN 17  CREATININE 0.66  CALCIUM 8.6*  AST 29  ALT 13*  ALKPHOS 141*  BILITOT 1.6*   ------------------------------------------------------------------------------------------------------------------  Cardiac Enzymes No results for input(s): TROPONINI in the last 168 hours. ------------------------------------------------------------------------------------------------------------------  RADIOLOGY:  Dg Chest Portable 1 View  Result Date: 01/31/2018 CLINICAL DATA:  Pneumonia. EXAM: PORTABLE CHEST 1 VIEW COMPARISON:  01/12/2016 FINDINGS: 1526 hours. The cardiopericardial silhouette is within normal limits for size. Calcified lymph nodes seen in the AP window and left hilum. Left base airspace disease is suspicious for pneumonia. No focal consolidation evident in the right lung. Interstitial markings are diffusely coarsened with chronic features. Gas distended bowel under the right hemidiaphragm is similar to prior. Posttraumatic deformity noted left proximal humerus. Bones are diffusely demineralized. Telemetry leads overlie the chest. IMPRESSION: Patchy airspace disease left base suspicious for pneumonia. Electronically Signed   By: Misty Stanley M.D.   On: 01/31/2018 15:41    EKG:  Sinus tachycardia heart rate 99 with PAC  IMPRESSION AND PLAN:   84 year old male from [place] with history of diabetes who presents with acute on chronic hypoxic respiratory failure.  1. Acute on chronic hypoxic respiratory failure  on 2 L of oxygen at baseline: Patient currently on BiPAP Wean to nasal cannula tolerated   2. Sepsis due to HCAP: Patient presents with fever of 103.2, tachycardia, tachypnea and leukocytosis Continue vancomycin and cefepime Follow-up on final blood cultures  3. Influenza a: Patient was recently treated however still has got evidence of influenza A Start Tamiflu and droplet precautions  4. Diabetes: Patient is nothing  by mouth and therefore I will hold outpatient medications Start sliding scale  5. Protein calorie malnutrition: Patient is very cachectic Dietary consultation requested  6. History of CLL: Patient is followed by oncology  PT evaluation for discharge planning Clinical social worker consultation for discharge planning Case discussed with intensivist on call.  All the records are reviewed and case discussed with ED provider. Management plans discussed with the patient and ex-wife and she is in agreement  CODE STATUS: DO NOT RESUSCITATE  Critical care TOTAL TIME TAKING CARE OF THIS PATIENT: 50 minutes.    Deniesha Stenglein M.D on 01/31/2018 at 4:06 PM  Between 7am to 6pm - Pager - [phone]  After 6pm go to [email or web address] - Proofreader  [facility]  Office  [phone]  CC: Primary care physician; Hunt Oris    .[email or web address]

## 2018-01-31 NOTE — ED Notes (Signed)
Dr. Karma Greaser and admitting MD at bedside.

## 2018-02-01 ENCOUNTER — Inpatient Hospital Stay: Payer: Medicare Other

## 2018-02-01 DIAGNOSIS — J101 Influenza due to other identified influenza virus with other respiratory manifestations: Secondary | ICD-10-CM

## 2018-02-01 LAB — URINALYSIS, COMPLETE (UACMP) WITH MICROSCOPIC
Bacteria, UA: NONE SEEN
Bilirubin Urine: NEGATIVE
GLUCOSE, UA: NEGATIVE mg/dL
HGB URINE DIPSTICK: NEGATIVE
Ketones, ur: 5 mg/dL — AB
LEUKOCYTES UA: NEGATIVE
Nitrite: NEGATIVE
Protein, ur: 30 mg/dL — AB
SPECIFIC GRAVITY, URINE: 1.02 (ref 1.005–1.030)
pH: 5 (ref 5.0–8.0)

## 2018-02-01 LAB — CBC
HEMATOCRIT: 31.4 % — AB (ref 40.0–52.0)
HEMOGLOBIN: 10 g/dL — AB (ref 13.0–18.0)
MCH: 26.1 pg (ref 26.0–34.0)
MCHC: 31.7 g/dL — AB (ref 32.0–36.0)
MCV: 82.5 fL (ref 80.0–100.0)
Platelets: 239 10*3/uL (ref 150–440)
RBC: 3.81 MIL/uL — ABNORMAL LOW (ref 4.40–5.90)
RDW: 15.7 % — AB (ref 11.5–14.5)
WBC: 20.6 10*3/uL — ABNORMAL HIGH (ref 3.8–10.6)

## 2018-02-01 LAB — BASIC METABOLIC PANEL
ANION GAP: 8 (ref 5–15)
BUN: 16 mg/dL (ref 6–20)
CALCIUM: 7.6 mg/dL — AB (ref 8.9–10.3)
CO2: 23 mmol/L (ref 22–32)
Chloride: 106 mmol/L (ref 101–111)
Creatinine, Ser: 0.47 mg/dL — ABNORMAL LOW (ref 0.61–1.24)
GFR calc Af Amer: >60 mL/min (ref >60)
GFR calc non Af Amer: >60 mL/min (ref >60)
GLUCOSE: 117 mg/dL — AB (ref 65–99)
POTASSIUM: 3.5 mmol/L (ref 3.5–5.1)
Sodium: 137 mmol/L (ref 135–145)

## 2018-02-01 LAB — HEMOGLOBIN A1C
HEMOGLOBIN A1C: 7 % — AB (ref 4.8–5.6)
Mean Plasma Glucose: 154.2 mg/dL

## 2018-02-01 LAB — GLUCOSE, CAPILLARY
GLUCOSE-CAPILLARY: 114 mg/dL — AB (ref 65–99)
Glucose-Capillary: 117 mg/dL — ABNORMAL HIGH (ref 65–99)
Glucose-Capillary: 160 mg/dL — ABNORMAL HIGH (ref 65–99)
Glucose-Capillary: 70 mg/dL (ref 65–99)

## 2018-02-01 MED ORDER — HYDROCOD POLST-CPM POLST ER 10-8 MG/5ML PO SUER
5.0000 mL | Freq: Two times a day (BID) | ORAL | Status: DC | PRN
  Administered 2018-02-01 – 2018-02-03 (×2): 5 mL via ORAL
  Filled 2018-02-01 (×2): qty 5

## 2018-02-01 MED ORDER — PHENYLEPHRINE HCL 10 MG/ML IJ SOLN
0.0000 ug/min | INTRAMUSCULAR | Status: DC
  Administered 2018-02-01 – 2018-02-02 (×2): 25 ug/min via INTRAVENOUS
  Administered 2018-02-02: 15 ug/min via INTRAVENOUS
  Administered 2018-02-03 (×2): 10 ug/min via INTRAVENOUS
  Filled 2018-02-01: qty 10
  Filled 2018-02-01: qty 1
  Filled 2018-02-01: qty 10
  Filled 2018-02-01: qty 1
  Filled 2018-02-01: qty 10

## 2018-02-01 MED ORDER — BENZONATATE 100 MG PO CAPS
100.0000 mg | ORAL_CAPSULE | Freq: Three times a day (TID) | ORAL | Status: DC
  Administered 2018-02-01 – 2018-02-06 (×12): 100 mg via ORAL
  Filled 2018-02-01 (×13): qty 1

## 2018-02-01 NOTE — Consult Note (Signed)
   Name: Wesley Mcguire MRN: [id number] DOB: 1931-09-08    ADMISSION DATE:  01/31/2018 CONSULTATION DATE: 01/31/2018  REFERRING MD : Dr. Benjie Karvonen   CHIEF COMPLAINT: follow up Shortness of Breath   BRIEF PATIENT DESCRIPTION: 84 yo male admitted with acute on chronic hypoxic respiratory failure secondary to influenza A and LLL pneumonia requiring Bipap   SIGNIFICANT EVENTS  02/12-Pt admitted to stepdown unit   STUDIES:  None   HISTORY OF PRESENT ILLNESS:   Remains on biPAP Lethargic resp distress +FLU  CXR reviewed LLL opacity  REVIEW OF SYSTEMS:  Limited due to SOB  SUBJECTIVE:  Pt states he is still short of breath.  VITAL SIGNS: Temp:  [98.1 F (36.7 C)-103.2 F (39.6 C)] 98.1 F (36.7 C) (02/13 0200) Pulse Rate:  [70-136] 72 (02/13 0600) Resp:  [16-38] 19 (02/13 0600) BP: (73-122)/(33-69) 87/51 (02/13 0600) SpO2:  [92 %-100 %] 100 % (02/13 0600) FiO2 (%):  [50 %-60 %] 50 % (02/13 0430) Weight:  [125 lb 3.5 oz (56.8 kg)-130 lb (59 kg)] 125 lb 3.5 oz (56.8 kg) (02/13 0500)  PHYSICAL EXAMINATION: General: acutely ill appearing male, NAD on Bipap  Neuro: alert oriented to self only, follows commands  HEENT: supple, no JVD  Cardiovascular: sinus rhythm, rrr, no M/R/G Lungs: faint rhonchi throughout, even, non labored  Abdomen: +BS x4, soft, non tender, non distended  Musculoskeletal: normal bulk and tone, no edema  Skin: stage II pressure ulcer sacral spine, stage I pressure ulcer left heel, and deep tissue injury right heel   Recent Labs  Lab 01/31/18 1454 02/01/18 0521  NA 136 137  K 4.4 3.5  CL 101 106  CO2 26 23  BUN 17 16  CREATININE 0.66 0.47*  GLUCOSE 173* 117*   Recent Labs  Lab 01/31/18 1454 02/01/18 0521  HGB 12.3* 10.0*  HCT 38.8* 31.4*  WBC 23.0* 20.6*  PLT 339 239    ASSESSMENT / PLAN: Acute on chronic hypoxic respiratory failure secondary to influenza A and LLL pneumonia  Lactic Acidosis-resolved Mildly elevated troponin likely  demand ischemia in setting of respiratory failure  Anemia without obvious acute blood loss Pressure Ulcers   Hx: CLL and Diabetes Mellitus  P: Prn Bipap for dyspnea and/or hypoxia  Continue scheduled bronchodilator therapy  Continue nebulized steroids Droplet precautions  Continuous telemetry monitoring  Follow cultures  Continue current abx Lovenox for VTE prophylaxis  Trend CBC  Monitor for s/sx of bleeding and transfuse for hgb <7 SSI  Turn pt q2hrs   Remains in STEP DOWN until weaned off biPAP   Kevin Mario Patricia Pesa, M.D.  [facility]  Medical Director [place] Director [facility] Department

## 2018-02-01 NOTE — Progress Notes (Signed)
PHARMACY - PHYSICIAN COMMUNICATION CRITICAL VALUE ALERT - BLOOD CULTURE IDENTIFICATION (BCID)  Wesley Mcguire is an 84 y.o. male who presented to [facility] on 01/31/2018 with a chief complaint of influenza and post obstructive pneumonia   Assessment:  Patient with pneumonia  (include suspected source if known)  Name of physician (or Provider) Contacted: Patricia Pesa   Current antibiotics: Cefepime and Vancomycin   Changes to prescribed antibiotics recommended:  Patient is on recommended antibiotics - No changes needed  Results for orders placed or performed during the hospital encounter of 01/31/18  Blood Culture ID Panel (Reflexed) (Collected: 01/31/2018  2:55 PM)  Result Value Ref Range   Enterococcus species NOT DETECTED NOT DETECTED   Listeria monocytogenes NOT DETECTED NOT DETECTED   Staphylococcus species DETECTED (A) NOT DETECTED   Staphylococcus aureus NOT DETECTED NOT DETECTED   Methicillin resistance DETECTED (A) NOT DETECTED   Streptococcus species NOT DETECTED NOT DETECTED   Streptococcus agalactiae NOT DETECTED NOT DETECTED   Streptococcus pneumoniae NOT DETECTED NOT DETECTED   Streptococcus pyogenes NOT DETECTED NOT DETECTED   Acinetobacter baumannii NOT DETECTED NOT DETECTED   Enterobacteriaceae species NOT DETECTED NOT DETECTED   Enterobacter cloacae complex NOT DETECTED NOT DETECTED   Escherichia coli NOT DETECTED NOT DETECTED   Klebsiella oxytoca NOT DETECTED NOT DETECTED   Klebsiella pneumoniae NOT DETECTED NOT DETECTED   Proteus species NOT DETECTED NOT DETECTED   Serratia marcescens NOT DETECTED NOT DETECTED   Haemophilus influenzae NOT DETECTED NOT DETECTED   Neisseria meningitidis NOT DETECTED NOT DETECTED   Pseudomonas aeruginosa NOT DETECTED NOT DETECTED   Candida albicans NOT DETECTED NOT DETECTED   Candida glabrata NOT DETECTED NOT DETECTED   Candida krusei NOT DETECTED NOT DETECTED   Candida parapsilosis NOT DETECTED NOT DETECTED   Candida  tropicalis NOT DETECTED NOT DETECTED    Wesley Mcguire 02/01/2018  1:50 PM

## 2018-02-01 NOTE — Progress Notes (Signed)
[facility] at [place] NAME: Wesley Mcguire    MR#:  [id number]  DATE OF BIRTH:  1931/09/10  SUBJECTIVE:  CHIEF COMPLAINT: Still on BiPAP but feeling better  REVIEW OF SYSTEMS:  CONSTITUTIONAL: No fever, fatigue or weakness.  EYES: No blurred or double vision.  EARS, NOSE, AND THROAT: No tinnitus or ear pain.  RESPIRATORY: No cough, shortness of breath, wheezing or hemoptysis.  CARDIOVASCULAR: No chest pain, orthopnea, edema.  GASTROINTESTINAL: No nausea, vomiting, diarrhea or abdominal pain.  GENITOURINARY: No dysuria, hematuria.  ENDOCRINE: No polyuria, nocturia,  HEMATOLOGY: No anemia, easy bruising or bleeding SKIN: No rash or lesion. MUSCULOSKELETAL: No joint pain or arthritis.   NEUROLOGIC: No tingling, numbness, weakness.  PSYCHIATRY: No anxiety or depression.   DRUG ALLERGIES:  No Known Allergies  VITALS:  Blood pressure (!) 90/44, pulse 68, temperature (!) 97.5 F (36.4 C), temperature source Oral, resp. rate 16, height 5\' 10"  (1.778 m), weight 56.8 kg (125 lb 3.5 oz), SpO2 96 %.  PHYSICAL EXAMINATION:  GENERAL:  84 y.o.-year-old patient lying in the bed with no acute distress.  EYES: Pupils equal, round, reactive to light and accommodation. No scleral icterus. Extraocular muscles intact.  HEENT: Head atraumatic, normocephalic. Oropharynx and nasopharynx clear.  NECK:  Supple, no jugular venous distention. No thyroid enlargement, no tenderness.  LUNGS: Normal breath sounds bilaterally, no wheezing, rales,rhonchi or crepitation. No use of accessory muscles of respiration.  CARDIOVASCULAR: S1, S2 normal. No murmurs, rubs, or gallops.  ABDOMEN: Soft, nontender, nondistended. Bowel sounds present. No organomegaly or mass.  EXTREMITIES: No pedal edema, cyanosis, or clubbing.  NEUROLOGIC: Cranial nerves II through XII are intact.  Sensation intact. Gait not checked.  PSYCHIATRIC: The patient is alert and oriented x 3.   SKIN: No obvious rash, lesion, or ulcer.    LABORATORY PANEL:   CBC Recent Labs  Lab 02/01/18 0521  WBC 20.6*  HGB 10.0*  HCT 31.4*  PLT 239   ------------------------------------------------------------------------------------------------------------------  Chemistries  Recent Labs  Lab 01/31/18 1454 02/01/18 0521  NA 136 137  K 4.4 3.5  CL 101 106  CO2 26 23  GLUCOSE 173* 117*  BUN 17 16  CREATININE 0.66 0.47*  CALCIUM 8.6* 7.6*  AST 29  --   ALT 13*  --   ALKPHOS 141*  --   BILITOT 1.6*  --    ------------------------------------------------------------------------------------------------------------------  Cardiac Enzymes Recent Labs  Lab 01/31/18 1454  TROPONINI 0.03*   ------------------------------------------------------------------------------------------------------------------  RADIOLOGY:  Dg Chest Port 1 View  Result Date: 02/01/2018 CLINICAL DATA:  Acute respiratory failure. EXAM: PORTABLE CHEST 1 VIEW COMPARISON:  01/31/2018 FINDINGS: The cardiac silhouette is normal in size. Patchy airspace opacities in the left mid lung and left lung base are similar to the prior study. There is a small left pleural effusion. Mild infrahilar opacity is noted in the right lung base. No pneumothorax is identified. Calcified lymph nodes are present in the mediastinum and left hilum. An old proximal left humerus fracture is again noted. IMPRESSION: 1. Unchanged left lung airspace disease concerning for pneumonia. Small left pleural effusion. 2. Mild medial right lung base atelectasis or pneumonia. Electronically Signed   By: Logan Bores M.D.   On: 02/01/2018 07:39   Dg Chest Portable 1 View  Result Date: 01/31/2018 CLINICAL DATA:  Pneumonia. EXAM: PORTABLE CHEST 1 VIEW COMPARISON:  01/12/2016 FINDINGS: 1526 hours. The cardiopericardial silhouette is within normal limits for size. Calcified lymph nodes seen in the  AP window and left hilum. Left base airspace disease is  suspicious for pneumonia. No focal consolidation evident in the right lung. Interstitial markings are diffusely coarsened with chronic features. Gas distended bowel under the right hemidiaphragm is similar to prior. Posttraumatic deformity noted left proximal humerus. Bones are diffusely demineralized. Telemetry leads overlie the chest. IMPRESSION: Patchy airspace disease left base suspicious for pneumonia. Electronically Signed   By: Misty Stanley M.D.   On: 01/31/2018 15:41    EKG:   Orders placed or performed during the hospital encounter of 01/31/18  . EKG 12-Lead  . EKG 12-Lead  . EKG 12-Lead  . EKG 12-Lead    ASSESSMENT AND PLAN:   84 year old male from [place] with history of diabetes who presents with acute on chronic hypoxic respiratory failure.  1. Acute on chronic hypoxic respiratory failure on 2 L of oxygen at baseline: Clinically better Patient currently on BiPAP, wean off as tolerated to nasal cannula Pulmonology is following   2. Sepsis due to HCAP: Patient presents with fever of 103.2, tachycardia, tachypnea and leukocytosis Continue vancomycin and cefepime Gram-positive cocci on blood cultures MRSA PCR is positive Urine culture pending  3. Influenza a: Patient was recently treated however still has got evidence of influenza A  Tamiflu and droplet precautions  4. Diabetes: Patient is nothing by mouth and therefore I will hold outpatient medications Start sliding scale  5. Protein calorie malnutrition: Patient is very cachectic Dietary consultation requested  6. History of CLL: Patient is followed by oncology  PT evaluation for discharge planning       All the records are reviewed and case discussed with Care Management/Social Workerr. Management plans discussed with the patient, family and they are in agreement.  CODE STATUS: DNR  TOTAL TIME TAKING CARE OF THIS PATIENT: 35 minutes.   POSSIBLE D/C IN 2-3  DAYS, DEPENDING ON  CLINICAL CONDITION.  Note: This dictation was prepared with Dragon dictation along with smaller phrase technology. Any transcriptional errors that result from this process are unintentional.   Nicholes Mango M.D on 02/01/2018 at 10:36 PM  Between 7am to 6pm - Pager - [phone] After 6pm go to [email or web address] - password [email or web address] [facility]  Office  [phone]  CC: Primary care physician; System, Pcp Not In

## 2018-02-01 NOTE — Progress Notes (Signed)
Taken off bipap and placed on 4l [place] after neb.

## 2018-02-01 NOTE — Progress Notes (Signed)
Central tele called to state that pt had a 9-beat run of V-tach. Upon reviewing the strip, pt had a 9-beat run of SVT.

## 2018-02-01 NOTE — Progress Notes (Signed)
Pharmacy Antibiotic Note  KEMARION ABBEY is a 84 y.o. male admitted on 01/31/2018 with pneumonia.  Pharmacy has been consulted for cefepime and vancomycin dosing. Patient admitted from [facility].   Plan: Cefepime 2 g IV q12hr.   Vancomycin 1g IV Q18hr for goal trough of 15-20. Will obtain trough prior to pm dose on 2/15  Height: 5\' 10"  (177.8 cm) Weight: 125 lb 3.5 oz (56.8 kg) IBW/kg (Calculated) : 73  Temp (24hrs), Avg:98 F (36.7 C), Min:97.3 F (36.3 C), Max:98.7 F (37.1 C)  Recent Labs  Lab 01/31/18 1454 01/31/18 1654 02/01/18 0521  WBC 23.0*  --  20.6*  CREATININE 0.66  --  0.47*  LATICACIDVEN 2.7* 1.3  --     Estimated Creatinine Clearance: 55.2 mL/min (A) (by C-G formula based on SCr of 0.47 mg/dL (L)).    No Known Allergies  Antimicrobials this admission: vancomycin 2/12 >>  cefepime 2/12 >>   Dose adjustments this admission:  Microbiology results: 2/12 BCx: MRSS 2/12 MRSA PCR: Positive   Thank you for allowing pharmacy to be a part of this patient's care.  Simpson,Michael L 02/01/2018 4:15 PM

## 2018-02-01 NOTE — Progress Notes (Signed)
Initial Nutrition Assessment  DOCUMENTATION CODES:   Severe malnutrition in context of chronic illness, Underweight  INTERVENTION:  Once diet able to be advanced recommend Ensure Enlive po BID, each supplement provides 350 kcal and 20 grams of protein.  Also recommend Magic cup TID with meals once diet advanced, each supplement provides 290 kcal and 9 grams of protein.  Continue daily MVI.  NUTRITION DIAGNOSIS:   Severe Malnutrition related to chronic illness(recurrent CLL) as evidenced by severe fat depletion, severe muscle depletion.  GOAL:   Patient will meet greater than or equal to 90% of their needs  MONITOR:   PO intake, Supplement acceptance, Diet advancement, Labs, Weight trends, Skin, I & O's  REASON FOR ASSESSMENT:   Consult Assessment of nutrition requirement/status  ASSESSMENT:   84 year old male with PMHx of CLL, DM type 2, anemia, neuromuscular disorder admitted from [facility] with acute on chronic hypoxic respiratory failure secondary to influenza A and LLL PNA.   -Per chart patient has recurrent CLL and is on Ibrutinib.  Met with patient at bedside. He has transitioned off of BiPAP and is now on Scalp Level. Patient is not a good historian per RN. He reported he has a good appetite and that he eats well usually. Unable to report where he came from or what foods he typically eats at meals. Denies any weight loss. Per chart patient was 124.5 lbs on 03/15/2017, so he has remained weight stable the past year.  [place] to get information on patient such as baseline diet. Was put on hold multiple times and could not reach anyone.  Medications reviewed and include: Novolog 0-9 units TID, MVI daily, Tamiflu, NS @ 75 mL/hr, cefepime, vancomycin.  Labs reviewed: CBG 96-117, Creatinine 0.47.  Discussed on rounds.  NUTRITION - FOCUSED PHYSICAL EXAM:    Most Recent Value  Orbital Region  Severe depletion  Upper Arm Region  Severe depletion   Thoracic and Lumbar Region  Severe depletion  Buccal Region  Severe depletion  Temple Region  Severe depletion  Clavicle Bone Region  Severe depletion  Clavicle and Acromion Bone Region  Severe depletion  Scapular Bone Region  Severe depletion  Dorsal Hand  Severe depletion  Patellar Region  Severe depletion  Anterior Thigh Region  Severe depletion  Posterior Calf Region  Severe depletion  Edema (RD Assessment)  None  Hair  Reviewed  Eyes  Reviewed  Mouth  Reviewed [poor dentition,  pt denies any trouble chewing but is a poor historian]  Skin  Reviewed  Nails  Reviewed     Diet Order:  Diet NPO time specified  EDUCATION NEEDS:   Not appropriate for education at this time  Skin:  Skin Assessment: Skin Integrity Issues: Skin Integrity Issues:: DTI, Stage I, Stage II DTI: right heel Stage I: left heel Stage II: b/l sacrum  Last BM:  Unknown  Height:   Ht Readings from Last 1 Encounters:  01/31/18 5' 10"  (1.778 m)    Weight:   Wt Readings from Last 1 Encounters:  02/01/18 125 lb 3.5 oz (56.8 kg)    Ideal Body Weight:  75.5 kg  BMI:  Body mass index is 17.97 kg/m.  Estimated Nutritional Needs:   Kcal:  1650-1905 (MSJ x 1.3-1.5)  Protein:  85-95 grams (1.5-1.7 grams/kg)  Fluid:  1.4-1.7 L/day (25-30 mL/kg)  Willey Blade, MS, RD, LDN Office: [phone] Pager: [phone] After Hours/Weekend Pager: [phone]

## 2018-02-01 NOTE — Consult Note (Signed)
[facility] Nurse wound consult note Reason for Consult: stage 2 pressure injury to sacrum, albumin 2.6 Deep tissue injury to right heel Blanchable erythema to left heel Wound type: pressure injury Pressure Injury POA: Yes Measurement: sacrum:  1 cm x 1 cm x 0.1 cm  Right heel:  Intact maroon discoloration:  1 cm x 2 cm  Left heel:  2 cm x 2 cm blanchable erythema Wound BMW:UXLK and moist Drainage (amount, consistency, odor) scant weeping Periwound:intact Dressing procedure/placement/frequency:Silicone foam to sacrum Offload bilateral heels with Prevalon boot Turn and reposition every two hours Will not follow at this time.  Please re-consult if needed.  Domenic Moras RN BSN [place] Pager [phone]

## 2018-02-01 NOTE — Progress Notes (Signed)
Left on room air after neb

## 2018-02-02 LAB — BLOOD CULTURE ID PANEL (REFLEXED)
ACINETOBACTER BAUMANNII: NOT DETECTED
Candida albicans: NOT DETECTED
Candida glabrata: NOT DETECTED
Candida krusei: NOT DETECTED
Candida parapsilosis: NOT DETECTED
Candida tropicalis: NOT DETECTED
ENTEROCOCCUS SPECIES: NOT DETECTED
Enterobacter cloacae complex: NOT DETECTED
Enterobacteriaceae species: NOT DETECTED
Escherichia coli: NOT DETECTED
Haemophilus influenzae: NOT DETECTED
Klebsiella oxytoca: NOT DETECTED
Klebsiella pneumoniae: NOT DETECTED
LISTERIA MONOCYTOGENES: NOT DETECTED
METHICILLIN RESISTANCE: NOT DETECTED
NEISSERIA MENINGITIDIS: NOT DETECTED
PROTEUS SPECIES: NOT DETECTED
PSEUDOMONAS AERUGINOSA: NOT DETECTED
SERRATIA MARCESCENS: NOT DETECTED
STAPHYLOCOCCUS AUREUS BCID: NOT DETECTED
STREPTOCOCCUS AGALACTIAE: NOT DETECTED
STREPTOCOCCUS SPECIES: NOT DETECTED
Staphylococcus species: DETECTED — AB
Streptococcus pneumoniae: NOT DETECTED
Streptococcus pyogenes: NOT DETECTED

## 2018-02-02 LAB — BASIC METABOLIC PANEL
ANION GAP: 7 (ref 5–15)
BUN: 13 mg/dL (ref 6–20)
CO2: 25 mmol/L (ref 22–32)
Calcium: 8.2 mg/dL — ABNORMAL LOW (ref 8.9–10.3)
Chloride: 110 mmol/L (ref 101–111)
Creatinine, Ser: 0.48 mg/dL — ABNORMAL LOW (ref 0.61–1.24)
GFR calc non Af Amer: >60 mL/min (ref >60)
GLUCOSE: 84 mg/dL (ref 65–99)
POTASSIUM: 4.2 mmol/L (ref 3.5–5.1)
Sodium: 142 mmol/L (ref 135–145)

## 2018-02-02 LAB — GLUCOSE, CAPILLARY
GLUCOSE-CAPILLARY: 84 mg/dL (ref 65–99)
GLUCOSE-CAPILLARY: 161 mg/dL — AB (ref 65–99)
GLUCOSE-CAPILLARY: 47 mg/dL — AB (ref 65–99)
GLUCOSE-CAPILLARY: 69 mg/dL (ref 65–99)
Glucose-Capillary: 69 mg/dL (ref 65–99)
Glucose-Capillary: 172 mg/dL — ABNORMAL HIGH (ref 65–99)

## 2018-02-02 LAB — URINE CULTURE: Culture: <10000 — AB

## 2018-02-02 LAB — PROCALCITONIN: Procalcitonin: 0.27 ng/mL

## 2018-02-02 MED ORDER — IBRUTINIB 420 MG PO TABS
420.0000 mg | ORAL_TABLET | Freq: Every day | ORAL | Status: DC
  Administered 2018-02-03 – 2018-02-07 (×5): 420 mg via ORAL
  Filled 2018-02-02 (×12): qty 1

## 2018-02-02 MED ORDER — MIDODRINE HCL 5 MG PO TABS
5.0000 mg | ORAL_TABLET | Freq: Three times a day (TID) | ORAL | Status: DC
  Administered 2018-02-02 – 2018-02-06 (×12): 5 mg via ORAL
  Filled 2018-02-02 (×13): qty 1

## 2018-02-02 NOTE — Progress Notes (Signed)
[place] [facility] [place] NAME: Wesley Mcguire    MR#:  [id number]  DATE OF BIRTH:  Dec 08, 1931  SUBJECTIVE:  Patient feeling much better off BiPAP  REVIEW OF SYSTEMS:  CONSTITUTIONAL: No fever, fatigue or weakness.  EYES: No blurred or double vision.  EARS, NOSE, AND THROAT: No tinnitus or ear pain.  RESPIRATORY: No cough, shortness of breath, wheezing or hemoptysis.  CARDIOVASCULAR: No chest pain, orthopnea, edema.  GASTROINTESTINAL: No nausea, vomiting, diarrhea or abdominal pain.  GENITOURINARY: No dysuria, hematuria.  ENDOCRINE: No polyuria, nocturia,  HEMATOLOGY: No anemia, easy bruising or bleeding SKIN: No rash or lesion. MUSCULOSKELETAL: No joint pain or arthritis.   NEUROLOGIC: No tingling, numbness, weakness.  PSYCHIATRY: No anxiety or depression.   DRUG ALLERGIES:  No Known Allergies  VITALS:  Blood pressure (!) 93/53, pulse 72, temperature 97.8 F (36.6 C), temperature source Axillary, resp. rate 19, height 5\' 10"  (1.778 m), weight 125 lb 3.5 oz (56.8 kg), SpO2 97 %.  PHYSICAL EXAMINATION:  GENERAL:  84 y.o.-year-old patient lying in the bed with no acute distress.  EYES: Pupils equal, round, reactive to light and accommodation. No scleral icterus. Extraocular muscles intact.  HEENT: Head atraumatic, normocephalic. Oropharynx and nasopharynx clear.  NECK:  Supple, no jugular venous distention. No thyroid enlargement, no tenderness.  LUNGS: Normal breath sounds bilaterally, no wheezing, rales,rhonchi or crepitation. No use of accessory muscles of respiration.  CARDIOVASCULAR: S1, S2 normal. No murmurs, rubs, or gallops.  ABDOMEN: Soft, nontender, nondistended. Bowel sounds present. No organomegaly or mass.  EXTREMITIES: No pedal edema, cyanosis, or clubbing.  NEUROLOGIC: Cranial nerves II through XII are intact.  Sensation intact. Gait not checked.  PSYCHIATRIC: The patient is alert and oriented x 3.  SKIN: No obvious  rash, lesion, or ulcer.    LABORATORY PANEL:   CBC Recent Labs  Lab 02/01/18 0521  WBC 20.6*  HGB 10.0*  HCT 31.4*  PLT 239   ------------------------------------------------------------------------------------------------------------------  Chemistries  Recent Labs  Lab 01/31/18 1454  02/02/18 0544  NA 136   < > 142  K 4.4   < > 4.2  CL 101   < > 110  CO2 26   < > 25  GLUCOSE 173*   < > 84  BUN 17   < > 13  CREATININE 0.66   < > 0.48*  CALCIUM 8.6*   < > 8.2*  AST 29  --   --   ALT 13*  --   --   ALKPHOS 141*  --   --   BILITOT 1.6*  --   --    < > = values in this interval not displayed.   ------------------------------------------------------------------------------------------------------------------  Cardiac Enzymes Recent Labs  Lab 01/31/18 1454  TROPONINI 0.03*   ------------------------------------------------------------------------------------------------------------------  RADIOLOGY:  Dg Chest Port 1 View  Result Date: 02/01/2018 CLINICAL DATA:  Acute respiratory failure. EXAM: PORTABLE CHEST 1 VIEW COMPARISON:  01/31/2018 FINDINGS: The cardiac silhouette is normal in size. Patchy airspace opacities in the left mid lung and left lung base are similar to the prior study. There is a small left pleural effusion. Mild infrahilar opacity is noted in the right lung base. No pneumothorax is identified. Calcified lymph nodes are present in the mediastinum and left hilum. An old proximal left humerus fracture is again noted. IMPRESSION: 1. Unchanged left lung airspace disease concerning for pneumonia. Small left pleural effusion. 2. Mild medial right lung base atelectasis or pneumonia. Electronically Signed  By: Logan Bores M.D.   On: 02/01/2018 07:39    EKG:   Orders placed or performed during the hospital encounter of 01/31/18  . EKG 12-Lead  . EKG 12-Lead  . EKG 12-Lead  . EKG 12-Lead    ASSESSMENT AND PLAN:   84 year old male from [employer] with history of diabetes who presents with acute on chronic hypoxic respiratory failure.  1. Acute on chronic hypoxic respiratory failure on 2 L of oxygen at baseline: Much improved Off BiPAP  pulmonology is following   2. Sepsis due to HCAP: Patient presents with fever of 103.2, tachycardia, tachypnea and leukocytosis Continue vancomycin and cefepime Gram-positive cocci on blood cultures MRSA PCR is positive Urine culture pending  3. Influenza a: Continue Tamiflu and droplet precautions  4. Diabetes: Patient is hypoglycemic continue to monitor sugar off medications  5. Protein calorie malnutrition: Patient is very cachectic Dietary consultation   6. History of CLL: Patient is followed by oncology  Discussed with intensivist regarding transfer to the floor they are agreeable      All the records are reviewed and case discussed with Care Management/Social Workerr. Management plans discussed with the patient, family and they are in agreement.  CODE STATUS: DNR  TOTAL TIME TAKING CARE OF THIS PATIENT: 35 minutes.   POSSIBLE D/C IN 2-3  DAYS, DEPENDING ON CLINICAL CONDITION.  Note: This dictation was prepared with Dragon dictation along with smaller phrase technology. Any transcriptional errors that result from this process are unintentional.   Dustin Flock M.D on 02/02/2018 at 4:14 PM  Between 7am to 6pm - Pager - [phone] After 6pm go to [email or web address] - password [email or web address] [facility]  Office  [phone]  CC: Primary care physician; System, Pcp Not In

## 2018-02-02 NOTE — Progress Notes (Signed)
Patient refused morning oral medication and insulin at this time.  Alert to self and place and stating "I am not sick."  RN explained to patient that he is sick and is in the hospital for the Flu.

## 2018-02-02 NOTE — Progress Notes (Signed)
  Blood glucose rechecked and is 84.  Patient eating breakfast.

## 2018-02-02 NOTE — Progress Notes (Signed)
Remains on neo drip at 15 mcg/min.  Alert with some confusion.  Son visited this evening and was updated on care.

## 2018-02-02 NOTE — Progress Notes (Signed)
Dr. Celesta Aver present and gave order for carb modified diet.  Patient drank cup of apple juice for blood glucose of 69.  Patient alert and talking asking to watch sports on tv.  Will recheck blood sugar.

## 2018-02-02 NOTE — Clinical Social Work Note (Signed)
CSW received consult that patient is from [facility] as a long term care resident.  CSW to continue to follow patient's progress throughout discharge planning.  Jones Broom. [place], MSW, [place]  02/02/2018 7:23 PM

## 2018-02-02 NOTE — Progress Notes (Signed)
Pt refused bipap , rn aware

## 2018-02-02 NOTE — Consult Note (Signed)
   Name: Wesley Mcguire MRN: [id number] DOB: [birth year]    ADMISSION DATE:  01/31/2018 CONSULTATION DATE: 01/31/2018  REFERRING MD : Dr. Benjie Karvonen   CHIEF COMPLAINT: follow up Shortness of Breath   BRIEF PATIENT DESCRIPTION: 84 yo male admitted with acute on chronic hypoxic respiratory failure secondary to influenza A and LLL pneumonia requiring Bipap   SIGNIFICANT EVENTS  02/12-Pt admitted to stepdown unit   STUDIES:  None   HISTORY OF PRESENT ILLNESS:   Has tolerated being off BIAPA on 2 liters [place]. More awake but still coughing. resp distress- improved +FLU  CXR reviewed LLL opacity  REVIEW OF SYSTEMS:  Limited due to SOB  SUBJECTIVE:  Pt states he is still short of breath.  VITAL SIGNS: Temp:  [97.3 F (36.3 C)-97.8 F (36.6 C)] 97.3 F (36.3 C) (02/14 1730) Pulse Rate:  [25-104] 56 (02/14 1830) Resp:  [11-30] 17 (02/14 1830) BP: (75-138)/(41-80) 109/51 (02/14 1830) SpO2:  [85 %-100 %] 95 % (02/14 1830) FiO2 (%):  [35 %] 35 % (02/14 0800)  PHYSICAL EXAMINATION: General: no acute distress  Neuro: alert oriented to self and place, follows commands  HEENT: supple, no JVD  Cardiovascular: sinus rhythm, rrr, no M/R/G Lungs: faint rhonchi throughout, even, non labored  Abdomen: +BS x4, soft, non tender, non distended  Musculoskeletal: normal bulk and tone, no edema  Skin: stage II pressure ulcer sacral spine, stage I pressure ulcer left heel, and deep tissue injury right heel   Recent Labs  Lab 01/31/18 1454 02/01/18 0521 02/02/18 0544  NA 136 137 142  K 4.4 3.5 4.2  CL 101 106 110  CO2 26 23 25   BUN 17 16 13   CREATININE 0.66 0.47* 0.48*  GLUCOSE 173* 117* 84   Recent Labs  Lab 01/31/18 1454 02/01/18 0521  HGB 12.3* 10.0*  HCT 38.8* 31.4*  WBC 23.0* 20.6*  PLT 339 239    ASSESSMENT / PLAN: 1. Acute on chronic hypoxic respiratory failure secondary to influenza A and LLL pneumonia  Lactic Acidosis-resolved. Continues to improve and now with less  Oxygen requirement 2. Mildly elevated troponin likely demand ischemia in setting of respiratory failure  3. Anemia without obvious acute blood loss 4. Pressure Ulcers   5. Hx: CLL and Diabetes Mellitus   P: Prn Bipap for dyspnea and/or hypoxia. Continue to wean O2 as able. Continue scheduled bronchodilator therapy  Continue nebulized steroids Droplet precautions  Continuous telemetry monitoring  Follow cultures  Continue current abx Lovenox for VTE prophylaxis  Trend CBC  Monitor for s/sx of bleeding and transfuse for hgb <7 SSI  Turn pt q2hrs   Remains in STEP DOWN until weaned off Neosynephrine. Will check Cortisol level.   Cammie Sickle M.D.  [place]

## 2018-02-02 NOTE — Progress Notes (Signed)
Pharmacy Antibiotic Note  Wesley Mcguire is a 84 y.o. male admitted on 01/31/2018 with pneumonia.  Pharmacy has been consulted for cefepime and vancomycin dosing. Patient admitted from [facility].   Plan: Cefepime 2 g IV q12hr.   Vancomycin 1g IV Q18hr for goal trough of 15-20. Will obtain trough prior to pm dose on 2/15  Height: 5\' 10"  (177.8 cm) Weight: 125 lb 3.5 oz (56.8 kg) IBW/kg (Calculated) : 73  Temp (24hrs), Avg:97.7 F (36.5 C), Min:97.5 F (36.4 C), Max:97.9 F (36.6 C)  Recent Labs  Lab 01/31/18 1454 01/31/18 1654 02/01/18 0521 02/02/18 0544  WBC 23.0*  --  20.6*  --   CREATININE 0.66  --  0.47* 0.48*  LATICACIDVEN 2.7* 1.3  --   --     Estimated Creatinine Clearance: 55.2 mL/min (A) (by C-G formula based on SCr of 0.48 mg/dL (L)).    No Known Allergies  Antimicrobials this admission: vancomycin 2/12 >>  cefepime 2/12 >>   Dose adjustments this admission:  Microbiology results: 2/12 BCx: GPC 3/4, MSSS on BCID 2/13 UCx: insignificant growth  2/12 MRSA PCR: Positive   Thank you for allowing pharmacy to be a part of this patient's care.  Pietra Zuluaga L 02/02/2018 3:42 PM

## 2018-02-03 DIAGNOSIS — E271 Primary adrenocortical insufficiency: Secondary | ICD-10-CM

## 2018-02-03 DIAGNOSIS — J1100 Influenza due to unidentified influenza virus with unspecified type of pneumonia: Secondary | ICD-10-CM

## 2018-02-03 LAB — CBC
HCT: 33.1 % — ABNORMAL LOW (ref 40.0–52.0)
HEMOGLOBIN: 10.6 g/dL — AB (ref 13.0–18.0)
MCH: 26.2 pg (ref 26.0–34.0)
MCHC: 32.2 g/dL (ref 32.0–36.0)
MCV: 81.3 fL (ref 80.0–100.0)
Platelets: 280 10*3/uL (ref 150–440)
RBC: 4.07 MIL/uL — AB (ref 4.40–5.90)
RDW: 15.9 % — ABNORMAL HIGH (ref 11.5–14.5)
WBC: 15.1 10*3/uL — AB (ref 3.8–10.6)

## 2018-02-03 LAB — BASIC METABOLIC PANEL
ANION GAP: 6 (ref 5–15)
BUN: 10 mg/dL (ref 6–20)
CALCIUM: 7.7 mg/dL — AB (ref 8.9–10.3)
CHLORIDE: 107 mmol/L (ref 101–111)
CO2: 25 mmol/L (ref 22–32)
Creatinine, Ser: 0.36 mg/dL — ABNORMAL LOW (ref 0.61–1.24)
GFR calc non Af Amer: >60 mL/min (ref >60)
GLUCOSE: 146 mg/dL — AB (ref 65–99)
POTASSIUM: 4.3 mmol/L (ref 3.5–5.1)
Sodium: 138 mmol/L (ref 135–145)

## 2018-02-03 LAB — PROCALCITONIN: Procalcitonin: 0.2 ng/mL

## 2018-02-03 LAB — GLUCOSE, CAPILLARY
GLUCOSE-CAPILLARY: 111 mg/dL — AB (ref 65–99)
GLUCOSE-CAPILLARY: 132 mg/dL — AB (ref 65–99)
GLUCOSE-CAPILLARY: 150 mg/dL — AB (ref 65–99)
GLUCOSE-CAPILLARY: 169 mg/dL — AB (ref 65–99)
GLUCOSE-CAPILLARY: 106 mg/dL — AB (ref 65–99)

## 2018-02-03 LAB — VANCOMYCIN, TROUGH: Vancomycin Tr: 15 ug/mL (ref 15–20)

## 2018-02-03 LAB — CORTISOL-AM, BLOOD: Cortisol - AM: 8.9 ug/dL (ref 6.7–22.6)

## 2018-02-03 MED ORDER — HYDROCORTISONE NA SUCCINATE PF 100 MG IJ SOLR
75.0000 mg | Freq: Three times a day (TID) | INTRAMUSCULAR | Status: DC
  Administered 2018-02-03 – 2018-02-05 (×6): 75 mg via INTRAVENOUS
  Filled 2018-02-03 (×6): qty 2

## 2018-02-03 MED ORDER — CHLORHEXIDINE GLUCONATE 0.12 % MT SOLN
15.0000 mL | Freq: Two times a day (BID) | OROMUCOSAL | Status: DC
  Administered 2018-02-03 – 2018-02-09 (×9): 15 mL via OROMUCOSAL
  Filled 2018-02-03 (×9): qty 15

## 2018-02-03 MED ORDER — INSULIN ASPART 100 UNIT/ML ~~LOC~~ SOLN
0.0000 [IU] | Freq: Three times a day (TID) | SUBCUTANEOUS | Status: DC
  Administered 2018-02-03: 1 [IU] via SUBCUTANEOUS
  Administered 2018-02-04: 3 [IU] via SUBCUTANEOUS
  Administered 2018-02-04: 1 [IU] via SUBCUTANEOUS
  Administered 2018-02-04: 4 [IU] via SUBCUTANEOUS
  Filled 2018-02-03 (×4): qty 1

## 2018-02-03 MED ORDER — ORAL CARE MOUTH RINSE
15.0000 mL | Freq: Two times a day (BID) | OROMUCOSAL | Status: DC
  Administered 2018-02-04 – 2018-02-09 (×3): 15 mL via OROMUCOSAL

## 2018-02-03 NOTE — Consult Note (Signed)
   Name: Wesley Mcguire MRN: [id number] DOB: [birth year]    ADMISSION DATE:  01/31/2018 CONSULTATION DATE: 01/31/2018  REFERRING MD : Dr. Benjie Karvonen   CHIEF COMPLAINT: follow up Shortness of Breath   BRIEF PATIENT DESCRIPTION: 84 yo male admitted with acute on chronic hypoxic respiratory failure secondary to influenza A and LLL pneumonia requiring Bipap   SIGNIFICANT EVENTS  02/12-Pt admitted to stepdown unit   STUDIES:  None   HISTORY OF PRESENT ILLNESS:   Has tolerated being off BIAPA on 2 liters [place]. More awake but still coughing. resp distress- improved +FLU  CXR reviewed LLL opacity  REVIEW OF SYSTEMS:  Limited due to SOB  SUBJECTIVE:  Pt states he is still short of breath.  VITAL SIGNS: Temp:  [97.4 F (36.3 C)-98 F (36.7 C)] 98 F (36.7 C) (02/15 1900) Pulse Rate:  [43-68] 57 (02/15 1900) Resp:  [10-25] 22 (02/15 1900) BP: (83-130)/(43-93) 94/54 (02/15 1900) SpO2:  [90 %-100 %] 91 % (02/15 1800)  PHYSICAL EXAMINATION: General: no acute distress  Neuro: alert oriented to self and place, follows commands  HEENT: supple, no JVD  Cardiovascular: sinus rhythm, rrr, no M/R/G Lungs: faint rhonchi throughout, even, non labored  Abdomen: +BS x4, soft, non tender, non distended  Musculoskeletal: normal bulk and tone, no edema  Skin: stage II pressure ulcer sacral spine, stage I pressure ulcer left heel, and deep tissue injury right heel   Recent Labs  Lab 02/01/18 0521 02/02/18 0544 02/03/18 0819  NA 137 142 138  K 3.5 4.2 4.3  CL 106 110 107  CO2 23 25 25   BUN 16 13 10   CREATININE 0.47* 0.48* 0.36*  GLUCOSE 117* 84 146*   Recent Labs  Lab 01/31/18 1454 02/01/18 0521 02/03/18 0819  HGB 12.3* 10.0* 10.6*  HCT 38.8* 31.4* 33.1*  WBC 23.0* 20.6* 15.1*  PLT 339 239 280    ASSESSMENT / PLAN: 1. Acute on chronic hypoxic respiratory failure secondary to influenza A and LLL pneumonia,  Continues to improve and now with less Oxygen requiremen Lactic  Acidosis-resolved 2. Mildly elevated troponin likely demand ischemia in setting of respiratory failure  3. Anemia without obvious acute blood loss 4. Pressure Ulcers   5. Adrenal Insufficiency 6. Hx: CLL and Diabetes Mellitus   P:  Continue on O2 as patient is chronically on [place] Continue scheduled bronchodilator therapy  Continue nebulized steroids Droplet precautions  Continuous telemetry monitoring  Follow cultures  Continue current abx Lovenox for VTE prophylaxis  Trend CBC  Monitor for s/sx of bleeding and transfuse for hgb <7 SSI;  Hydrocortisone started.  Turn pt q2hrs  Patient has been off Neosynephrine.  Will transfer out of ICU if he stays off pressor.  Hydrocortisone started.   Cammie Sickle M.D.  [place]

## 2018-02-03 NOTE — Progress Notes (Signed)
Pharmacy Antibiotic Note  Wesley Mcguire is a 84 y.o. male admitted on 01/31/2018 with pneumonia.  Pharmacy has been consulted for cefepime and vancomycin dosing. Patient admitted from [facility].   Plan: Cefepime 2 g IV q12hr.   Vancomycin 1g IV Q18hr for goal trough of 15-20. Will obtain trough prior to pm dose on 2/15  Height: 5\' 10"  (177.8 cm) Weight: 125 lb 3.5 oz (56.8 kg) IBW/kg (Calculated) : 73  Temp (24hrs), Avg:97.4 F (36.3 C), Min:97.3 F (36.3 C), Max:97.5 F (36.4 C)  Recent Labs  Lab 01/31/18 1454 01/31/18 1654 02/01/18 0521 02/02/18 0544 02/03/18 0819  WBC 23.0*  --  20.6*  --  15.1*  CREATININE 0.66  --  0.47* 0.48* 0.36*  LATICACIDVEN 2.7* 1.3  --   --   --     Estimated Creatinine Clearance: 55.2 mL/min (A) (by C-G formula based on SCr of 0.36 mg/dL (L)).    No Known Allergies  Antimicrobials this admission: vancomycin 2/12 >>  cefepime 2/12 >>   Dose adjustments this admission:  Microbiology results: 2/12 BCx: GPC 3/4, MSSS on BCID 2/13 UCx: insignificant growth  2/12 MRSA PCR: Positive   Thank you for allowing pharmacy to be a part of this patient's care.  Simpson,Michael L 02/03/2018 2:50 PM

## 2018-02-03 NOTE — Progress Notes (Signed)
Inpatient Diabetes Program Recommendations  AACE/ADA: New Consensus Statement on Inpatient Glycemic Control (2015)  Target Ranges:  Prepandial:   less than 140 mg/dL      Peak postprandial:   less than 180 mg/dL (1-2 hours)      Critically ill patients:  140 - 180 mg/dL   Lab Results  Component Value Date   GLUCAP 132 (H) 02/03/2018   HGBA1C 7.0 (H) 01/31/2018    Review of Glycemic Control  Results for BURNEY, CALZADILLA (MRN [id number]) as of 02/03/2018 09:53  Ref. Range 02/02/2018 16:19 02/02/2018 21:40 02/02/2018 22:48 02/03/2018 01:39 02/03/2018 07:35  Glucose-Capillary Latest Ref Range: 65 - 99 mg/dL 172 (H) 47 (L) 69 111 (H) 132 (H)    Diabetes history: Type 2 Outpatient Diabetes medications: Levemir 10 units qhs, Novolog 5 units qday.   Current orders for Inpatient glycemic control: Novolog 0-9 units tid  Inpatient Diabetes Program Recommendations: 3 low blood sugars over the past 2 days with minimal insulin given.   Consider changing Novolog correction tid  -Custom Novolog correction scale 0-5 units       151-200  1 unit      201-250  2 units      251-300  3 units      301-350  4 units      351-400  5 units   Gentry Fitz, RN, [place], [place], CDE Diabetes Coordinator Inpatient Diabetes Program  [phone] (Team Pager) [phone] ([place]) 02/03/2018 9:59 AM

## 2018-02-03 NOTE — Progress Notes (Signed)
[place] [facility] [place] NAME: Wesley Mcguire    MR#:  [id number]  DATE OF BIRTH:  1931/09/02  SUBJECTIVE:   pt on Levophed for low blood pressure otherwise is feeling much better  REVIEW OF SYSTEMS:  CONSTITUTIONAL: No fever, fatigue or weakness.  EYES: No blurred or double vision.  EARS, NOSE, AND THROAT: No tinnitus or ear pain.  RESPIRATORY: No cough, shortness of breath, wheezing or hemoptysis.  CARDIOVASCULAR: No chest pain, orthopnea, edema.  GASTROINTESTINAL: No nausea, vomiting, diarrhea or abdominal pain.  GENITOURINARY: No dysuria, hematuria.  ENDOCRINE: No polyuria, nocturia,  HEMATOLOGY: No anemia, easy bruising or bleeding SKIN: No rash or lesion. MUSCULOSKELETAL: No joint pain or arthritis.   NEUROLOGIC: No tingling, numbness, weakness.  PSYCHIATRY: No anxiety or depression.   DRUG ALLERGIES:  No Known Allergies  VITALS:  Blood pressure 111/64, pulse 68, temperature (!) 97.4 F (36.3 C), temperature source Oral, resp. rate (!) 21, height 5\' 10"  (1.778 m), weight 125 lb 3.5 oz (56.8 kg), SpO2 96 %.  PHYSICAL EXAMINATION:  GENERAL:  84 y.o.-year-old patient lying in the bed with no acute distress.  EYES: Pupils equal, round, reactive to light and accommodation. No scleral icterus. Extraocular muscles intact.  HEENT: Head atraumatic, normocephalic. Oropharynx and nasopharynx clear.  NECK:  Supple, no jugular venous distention. No thyroid enlargement, no tenderness.  LUNGS: Normal breath sounds bilaterally, no wheezing, rales,rhonchi or crepitation. No use of accessory muscles of respiration.  CARDIOVASCULAR: S1, S2 normal. No murmurs, rubs, or gallops.  ABDOMEN: Soft, nontender, nondistended. Bowel sounds present. No organomegaly or mass.  EXTREMITIES: No pedal edema, cyanosis, or clubbing.  NEUROLOGIC: Cranial nerves II through XII are intact.  Sensation intact. Gait not checked.  PSYCHIATRIC: The patient is alert  and oriented x 3.  SKIN: No obvious rash, lesion, or ulcer.    LABORATORY PANEL:   CBC Recent Labs  Lab 02/03/18 0819  WBC 15.1*  HGB 10.6*  HCT 33.1*  PLT 280   ------------------------------------------------------------------------------------------------------------------  Chemistries  Recent Labs  Lab 01/31/18 1454  02/03/18 0819  NA 136   < > 138  K 4.4   < > 4.3  CL 101   < > 107  CO2 26   < > 25  GLUCOSE 173*   < > 146*  BUN 17   < > 10  CREATININE 0.66   < > 0.36*  CALCIUM 8.6*   < > 7.7*  AST 29  --   --   ALT 13*  --   --   ALKPHOS 141*  --   --   BILITOT 1.6*  --   --    < > = values in this interval not displayed.   ------------------------------------------------------------------------------------------------------------------  Cardiac Enzymes Recent Labs  Lab 01/31/18 1454  TROPONINI 0.03*   ------------------------------------------------------------------------------------------------------------------  RADIOLOGY:  No results found.  EKG:   Orders placed or performed during the hospital encounter of 01/31/18  . EKG 12-Lead  . EKG 12-Lead  . EKG 12-Lead  . EKG 12-Lead    ASSESSMENT AND PLAN:   84 year old male from [place] with history of diabetes who presents with acute on chronic hypoxic respiratory failure.  1. Acute on chronic hypoxic respiratory failure on 2 L of oxygen at baseline: Much improved Off BiPAP  pulmonology is following   2. Sepsis due to HCAP: Patient presents with fever of 103.2, tachycardia, tachypnea and leukocytosis Continue vancomycin and cefepime Gram-positive cocci on blood  cultures MRSA PCR is positive Continue levophed try to wean off as able to   3. Influenza a: treaetd with 5 days of tamiflue  4. Diabetes: Patient was hypoglycemic continue to monitor sugar off medications on ssi  5. Protein calorie malnutrition: Patient is very cachectic Dietary consultation   6. History  of CLL: Patient is followed by oncology  Discussed with intensivist regarding transfer to the floor they are agreeable      All the records are reviewed and case discussed with Care Management/Social Workerr. Management plans discussed with the patient, family and they are in agreement.  CODE STATUS: DNR  TOTAL TIME TAKING CARE OF THIS PATIENT: 35 minutes.   POSSIBLE D/C IN 2-3  DAYS, DEPENDING ON CLINICAL CONDITION.  Note: This dictation was prepared with Dragon dictation along with smaller phrase technology. Any transcriptional errors that result from this process are unintentional.   Dustin Flock M.D on 02/03/2018 at 3:35 PM  Between 7am to 6pm - Pager - [phone] After 6pm go to [email or web address] - password [email or web address] [place] [facility]  Office  [phone]  CC: Primary care physician; System, Pcp Not In

## 2018-02-04 DIAGNOSIS — J189 Pneumonia, unspecified organism: Secondary | ICD-10-CM

## 2018-02-04 DIAGNOSIS — E440 Moderate protein-calorie malnutrition: Secondary | ICD-10-CM

## 2018-02-04 DIAGNOSIS — E2749 Other adrenocortical insufficiency: Secondary | ICD-10-CM

## 2018-02-04 LAB — GLUCOSE, CAPILLARY
GLUCOSE-CAPILLARY: 252 mg/dL — AB (ref 65–99)
GLUCOSE-CAPILLARY: 424 mg/dL — AB (ref 65–99)
GLUCOSE-CAPILLARY: 447 mg/dL — AB (ref 65–99)
Glucose-Capillary: 178 mg/dL — ABNORMAL HIGH (ref 65–99)
Glucose-Capillary: 348 mg/dL — ABNORMAL HIGH (ref 65–99)

## 2018-02-04 LAB — CULTURE, BLOOD (ROUTINE X 2)
Special Requests: ADEQUATE
Special Requests: ADEQUATE

## 2018-02-04 MED ORDER — DRONABINOL 2.5 MG PO CAPS
2.5000 mg | ORAL_CAPSULE | Freq: Every day | ORAL | Status: DC
  Administered 2018-02-04 – 2018-02-09 (×5): 2.5 mg via ORAL
  Filled 2018-02-04 (×3): qty 1

## 2018-02-04 MED ORDER — ENSURE ENLIVE PO LIQD
237.0000 mL | Freq: Two times a day (BID) | ORAL | Status: DC
  Administered 2018-02-04 – 2018-02-05 (×3): 237 mL via ORAL

## 2018-02-04 MED ORDER — JUVEN PO PACK: 1.0000 | PACK | Freq: Two times a day (BID) | ORAL | Status: DC

## 2018-02-04 MED ORDER — ALBUMIN HUMAN 25 % IV SOLN
50.0000 g | Freq: Once | INTRAVENOUS | Status: AC
  Administered 2018-02-04: 50 g via INTRAVENOUS
  Filled 2018-02-04: qty 200

## 2018-02-04 MED ORDER — INSULIN ASPART 100 UNIT/ML ~~LOC~~ SOLN
0.0000 [IU] | SUBCUTANEOUS | Status: DC
  Administered 2018-02-05: 11 [IU] via SUBCUTANEOUS
  Administered 2018-02-05 (×3): 7 [IU] via SUBCUTANEOUS
  Administered 2018-02-05: 11 [IU] via SUBCUTANEOUS
  Administered 2018-02-05: 20 [IU] via SUBCUTANEOUS
  Administered 2018-02-06 (×3): 4 [IU] via SUBCUTANEOUS
  Administered 2018-02-06: 7 [IU] via SUBCUTANEOUS
  Filled 2018-02-04 (×10): qty 1

## 2018-02-04 NOTE — Progress Notes (Signed)
Patient's blood pressure fluctuated during shift, SBP anywhere from mid 70s to 100s, MAP anywhere from upper 50s to 70s. No change in patient's mental status with blood pressure changes and patient making urine. Patient with some confusion, disoriented to time and situation. Was refusing a lot of medication and treatment with RN but seemed to become more cooperative throughout the shift. Patient with poor PO intake (had been occurring prior to admission per family), especially solid food. Patient does enjoy ensure shakes, yogurt, and ice cream per observation and family report.

## 2018-02-04 NOTE — Progress Notes (Addendum)
Pt rested well for most of night, awake, but mildly confused , O2 sats stayed in mid 90s, 02 removed, HR stable in 70s, BP MAP was 65-68, but dropped to MAP of 57, NP aware and orders to hold Neo at this time as pt is warm and dry, has 1000 ml urine output and stable cognition.  Additional foam placed on red elbows, turned frequently, foam on bil heels and sacrum. Pt has refused all oral drinks/food, RD consult for am  due to multiple pressure areas, thin frame and poor po intake

## 2018-02-04 NOTE — Progress Notes (Signed)
Pharmacy Antibiotic Note  Wesley Mcguire is a 84 y.o. male admitted on 01/31/2018 with pneumonia.  Pharmacy has been consulted for cefepime and vancomycin dosing. Patient admitted from [facility].   Plan: Cefepime 2 g IV q12hr.  VT resulted @ 15. Will continue current Vancomycin dosing of 1g IV q18 hours.   Height: 5\' 10"  (177.8 cm) Weight: 124 lb 5.4 oz (56.4 kg) IBW/kg (Calculated) : 73  Temp (24hrs), Avg:97.5 F (36.4 C), Min:96.4 F (35.8 C), Max:98 F (36.7 C)  Recent Labs  Lab 01/31/18 1454 01/31/18 1654 02/01/18 0521 02/02/18 0544 02/03/18 0819 02/03/18 2110  WBC 23.0*  --  20.6*  --  15.1*  --   CREATININE 0.66  --  0.47* 0.48* 0.36*  --   LATICACIDVEN 2.7* 1.3  --   --   --   --   VANCOTROUGH  --   --   --   --   --  15    Estimated Creatinine Clearance: 54.8 mL/min (A) (by C-G formula based on SCr of 0.36 mg/dL (L)).    No Known Allergies  Antimicrobials this admission: vancomycin 2/12 >>  cefepime 2/12 >>   Dose adjustments this admission:  Microbiology results: 2/12 BCx: GPC 3/4, MSSS on BCID 2/13 UCx: insignificant growth  2/12 MRSA PCR: Positive   Thank you for allowing pharmacy to be a part of this patient's care.  Konnor Jorden D 02/04/2018 7:50 AM

## 2018-02-04 NOTE — Consult Note (Signed)
   Name: Wesley Mcguire MRN: [id number] DOB: 1931-12-20    ADMISSION DATE:  01/31/2018 CONSULTATION DATE: 01/31/2018  REFERRING MD : Dr. Benjie Karvonen   CHIEF COMPLAINT: follow up Shortness of Breath   BRIEF PATIENT DESCRIPTION: 84 yo male admitted with acute on chronic hypoxic respiratory failure secondary to influenza A and LLL pneumonia requiring Bipap   SIGNIFICANT EVENTS  02/12-Pt admitted to stepdown unit   STUDIES:  None   HISTORY OF PRESENT ILLNESS:   Has remained on 2 liters [place] without comrpromise. More awake but still coughing. resp distress- improved +FLU Very poor appetite   REVIEW OF SYSTEMS:  NAD except for poor appetite  SUBJECTIVE:  Pt states he is still short of breath.  VITAL SIGNS: Temp:  [96.4 F (35.8 C)-98 F (36.7 C)] 96.4 F (35.8 C) (02/16 0730) Pulse Rate:  [49-86] 84 (02/16 1000) Resp:  [13-30] 16 (02/16 1000) BP: (78-130)/(43-99) 78/49 (02/16 1000) SpO2:  [91 %-100 %] 98 % (02/16 1000) Weight:  [124 lb 5.4 oz (56.4 kg)] 124 lb 5.4 oz (56.4 kg) (02/16 0500)  PHYSICAL EXAMINATION: General: no acute distress  Neuro: alert oriented to self and place, follows commands  HEENT: supple, no JVD  Cardiovascular: sinus rhythm, rrr, no M/R/G Lungs: CTA, even, non labored  Abdomen: +BS x4, soft, non tender, non distended  Musculoskeletal: normal bulk and tone, no edema  Skin: stage II pressure ulcer sacral spine, stage I pressure ulcer left heel, and deep tissue injury right heel   Recent Labs  Lab 02/01/18 0521 02/02/18 0544 02/03/18 0819  NA 137 142 138  K 3.5 4.2 4.3  CL 106 110 107  CO2 23 25 25   BUN 16 13 10   CREATININE 0.47* 0.48* 0.36*  GLUCOSE 117* 84 146*   Recent Labs  Lab 01/31/18 1454 02/01/18 0521 02/03/18 0819  HGB 12.3* 10.0* 10.6*  HCT 38.8* 31.4* 33.1*  WBC 23.0* 20.6* 15.1*  PLT 339 239 280    ASSESSMENT / PLAN: 1. Acute on chronic hypoxic respiratory failure secondary to influenza A and LLL pneumonia,  Continues  to improve and now with less Oxygen requirement Lactic Acidosis-resolved 2. Mildly elevated troponin likely demand ischemia in setting of respiratory failure  3. Anemia without obvious acute blood loss 4. Pressure Ulcers   5. Adrenal Insufficiency 6. Hx: CLL and Diabetes Mellitus  7. Protein malnutrition with poor appetite  P:  Continue on O2 as patient is chronically on Oygen Continue scheduled bronchodilator therapy  Continue nebulized steroids Droplet precautions  Continuous telemetry monitoring  Will repeat blood cultures as it appears to have been contaminant  Continue current abx Lovenox for VTE prophylaxis  Trend CBC Monitor for s/sx of bleeding and transfuse for hgb <7 SSI;  Hydrocortisone started.  Turn pt q2hrs  Will add nutritional supplements, add low dose Marinol for appetite stimulant   Cammie Sickle M.D.  [place]

## 2018-02-04 NOTE — Progress Notes (Signed)
[facility] NAME: Wesley Mcguire    MR#:  [id number]  DATE OF BIRTH:  Apr 29, 1931  SUBJECTIVE:   Patient with confusion this morning Off of pressors this morning Still with decreased oral intake  REVIEW OF SYSTEMS:    Review of Systems  Constitutional: Negative for fever, chills weight loss positive for generalized weakness HENT: Negative for ear pain, nosebleeds, congestion, facial swelling, rhinorrhea, neck pain, neck stiffness and ear discharge.   Respiratory: Cough and shortness of breath Cardiovascular: Negative for chest pain, palpitations and leg swelling.  Gastrointestinal: Negative for heartburn, abdominal pain, vomiting, diarrhea or consitpation Genitourinary: Negative for dysuria, urgency, frequency, hematuria Musculoskeletal: Negative for back pain or joint pain Neurological: Negative for dizziness, seizures, syncope, focal weakness,  numbness and headaches.  Hematological: Does not bruise/bleed easily.  Psychiatric/Behavioral: Negative for hallucinations, positive confusion, dysphoric mood    Tolerating Diet: Not eating very much      DRUG ALLERGIES:  No Known Allergies  VITALS:  Blood pressure (!) 93/49, pulse 84, temperature (!) 97.3 F (36.3 C), resp. rate (!) 24, height 5\' 10"  (1.778 m), weight 56.4 kg (124 lb 5.4 oz), SpO2 96 %.  PHYSICAL EXAMINATION:  Constitutional: Appears thin frail . No distress. HENT: Normocephalic. Marland Kitchen Oropharynx is clear and moist.  Eyes: Conjunctivae and EOM are normal. PERRLA, no scleral icterus.  Neck: Normal ROM. Neck supple. No JVD. No tracheal deviation. CVS: RRR, S1/S2 +, no murmurs, no gallops, no carotid bruit.  Pulmonary: Effort and breath sounds normal, no stridor, rhonchi, wheezes, rales.  Abdominal: Soft. BS +,  no distension, tenderness, rebound or guarding.  Musculoskeletal: Normal range of motion. No edema and no tenderness.  Neuro: Alert. CN 2-12 grossly  intact. No focal deficits. Skin: Skin is warm and dry. No rash noted. Psychiatric: confused a bit this am.      LABORATORY PANEL:   CBC Recent Labs  Lab 02/03/18 0819  WBC 15.1*  HGB 10.6*  HCT 33.1*  PLT 280   ------------------------------------------------------------------------------------------------------------------  Chemistries  Recent Labs  Lab 01/31/18 1454  02/03/18 0819  NA 136   < > 138  K 4.4   < > 4.3  CL 101   < > 107  CO2 26   < > 25  GLUCOSE 173*   < > 146*  BUN 17   < > 10  CREATININE 0.66   < > 0.36*  CALCIUM 8.6*   < > 7.7*  AST 29  --   --   ALT 13*  --   --   ALKPHOS 141*  --   --   BILITOT 1.6*  --   --    < > = values in this interval not displayed.   ------------------------------------------------------------------------------------------------------------------  Cardiac Enzymes Recent Labs  Lab 01/31/18 1454  TROPONINI 0.03*   ------------------------------------------------------------------------------------------------------------------  RADIOLOGY:  No results found.   ASSESSMENT AND PLAN:   84 year old male from [place] with a history of diabetes who presents with shortness of breath.  1. Acute on chronic hypoxic respiratory failure in the setting of HCAP and influenza A Patient continues to improve Patient is on baseline oxygen   2. Sepsis due to HCAP: Patient presented with fever, tachycardia and tachypnea along with leukocytosis  Continue cefepime and vancomycin MRSA PCR is positive Initial blood cultures were felt to be contaminant Repeat blood cultures Patient has been weaned off of pressors  3. Severe protein calorie malnutrition with poor by  mouth intake: Continue feeding supplements and Marinol for appetite stimulant  4. Influenza A: Treated   5. Elevated troponin due to demand ischemia. Patient has ruled out for ACS  6. Diabetes: Due to hypoglycemia with poor by mouth intake diabetic  medications are discontinued  7. History of CLL: Patient is followed by oncologist  Patient would benefit from palliative care consultation  Discussed with intensivist Management plans discussed with the patient and he is in agreement.  PT evaluation   CODE STATUS: DO NOT RESUSCITATE  TOTAL TIME TAKING CARE OF THIS PATIENT: 22 minutes.     POSSIBLE D/C 2-3 days, DEPENDING ON CLINICAL CONDITION.   Neala Miggins M.D on 02/04/2018 at 12:25 PM  Between 7am to 6pm - Pager - [phone] After 6pm go to [email or web address] - password [email or web address] [facility]  Office  [phone]  CC: Primary care physician; System, Pcp Not In  Note: This dictation was prepared with Dragon dictation along with smaller phrase technology. Any transcriptional errors that result from this process are unintentional.

## 2018-02-05 DIAGNOSIS — E46 Unspecified protein-calorie malnutrition: Secondary | ICD-10-CM

## 2018-02-05 DIAGNOSIS — E876 Hypokalemia: Secondary | ICD-10-CM

## 2018-02-05 LAB — MAGNESIUM: MAGNESIUM: 2.1 mg/dL (ref 1.7–2.4)

## 2018-02-05 LAB — BASIC METABOLIC PANEL
Anion gap: 8 (ref 5–15)
BUN: 21 mg/dL — ABNORMAL HIGH (ref 6–20)
CHLORIDE: 109 mmol/L (ref 101–111)
CO2: 25 mmol/L (ref 22–32)
CREATININE: 0.71 mg/dL (ref 0.61–1.24)
Calcium: 8.1 mg/dL — ABNORMAL LOW (ref 8.9–10.3)
GFR calc Af Amer: >60 mL/min (ref >60)
GFR calc non Af Amer: >60 mL/min (ref >60)
GLUCOSE: 295 mg/dL — AB (ref 65–99)
POTASSIUM: 3.3 mmol/L — AB (ref 3.5–5.1)
Sodium: 142 mmol/L (ref 135–145)

## 2018-02-05 LAB — PHOSPHORUS: Phosphorus: 1.6 mg/dL — ABNORMAL LOW (ref 2.5–4.6)

## 2018-02-05 LAB — CBC
HEMATOCRIT: 30.4 % — AB (ref 40.0–52.0)
HEMOGLOBIN: 9.6 g/dL — AB (ref 13.0–18.0)
MCH: 25.7 pg — AB (ref 26.0–34.0)
MCHC: 31.5 g/dL — ABNORMAL LOW (ref 32.0–36.0)
MCV: 81.5 fL (ref 80.0–100.0)
Platelets: 215 10*3/uL (ref 150–440)
RBC: 3.73 MIL/uL — AB (ref 4.40–5.90)
RDW: 15.5 % — ABNORMAL HIGH (ref 11.5–14.5)
WBC: 7.7 10*3/uL (ref 3.8–10.6)

## 2018-02-05 LAB — GLUCOSE, CAPILLARY
GLUCOSE-CAPILLARY: 254 mg/dL — AB (ref 65–99)
GLUCOSE-CAPILLARY: 221 mg/dL — AB (ref 65–99)
GLUCOSE-CAPILLARY: 245 mg/dL — AB (ref 65–99)
Glucose-Capillary: 277 mg/dL — ABNORMAL HIGH (ref 65–99)
Glucose-Capillary: 222 mg/dL — ABNORMAL HIGH (ref 65–99)

## 2018-02-05 MED ORDER — ENSURE ENLIVE PO LIQD
237.0000 mL | Freq: Three times a day (TID) | ORAL | Status: DC
  Administered 2018-02-05 – 2018-02-09 (×9): 237 mL via ORAL

## 2018-02-05 MED ORDER — HYDROCORTISONE NA SUCCINATE PF 100 MG IJ SOLR
50.0000 mg | Freq: Two times a day (BID) | INTRAMUSCULAR | Status: DC
  Administered 2018-02-06 – 2018-02-07 (×4): 50 mg via INTRAVENOUS
  Filled 2018-02-05 (×4): qty 2

## 2018-02-05 MED ORDER — POTASSIUM PHOSPHATES 15 MMOLE/5ML IV SOLN
INTRAVENOUS | Status: DC
  Administered 2018-02-05 – 2018-02-08 (×4): via INTRAVENOUS
  Filled 2018-02-05 (×5): qty 1000

## 2018-02-05 MED ORDER — IPRATROPIUM-ALBUTEROL 0.5-2.5 (3) MG/3ML IN SOLN
3.0000 mL | Freq: Three times a day (TID) | RESPIRATORY_TRACT | Status: DC
  Administered 2018-02-05 – 2018-02-06 (×3): 3 mL via RESPIRATORY_TRACT
  Filled 2018-02-05 (×3): qty 3

## 2018-02-05 MED ORDER — POTASSIUM CHLORIDE IN NACL 20-0.9 MEQ/L-% IV SOLN: INTRAVENOUS | Status: DC

## 2018-02-05 MED ORDER — SODIUM CHLORIDE 0.9 % IV SOLN: INTRAVENOUS | Status: DC

## 2018-02-05 MED ORDER — IPRATROPIUM-ALBUTEROL 0.5-2.5 (3) MG/3ML IN SOLN: 3.0000 mL | RESPIRATORY_TRACT | Status: DC | PRN

## 2018-02-05 NOTE — Evaluation (Signed)
Physical Therapy Evaluation Patient Details Name: Wesley Mcguire MRN: [id number] DOB: [birth year] Today's Date: 02/05/2018   History of Present Illness  Patient is an 84 y/o male that presents with shortness of breath, was found to have O2 sats in the low 80s at [facility]. Found to have HCAP and influenza A.   Clinical Impression  Patient is largely disagreable to therapy this date, he refused supine to sit transfer multiple times before participating in mobility evaluation. Per some old notes he was ambulatory at some point in the last 1-2 years, but given the sores on his heels, and the strong posterior lean he demonstrates in supine to sit transfer this date, it is more likely he has been minimally ambulatory and mobile recently. He requires total assistance for transfers, and will require extensive rehabilitation services to allow for transition back to previous level of care.     Follow Up Recommendations SNF    Equipment Recommendations       Recommendations for Other Services       Precautions / Restrictions Precautions Precautions: Fall Restrictions Weight Bearing Restrictions: No      Mobility  Bed Mobility Overal bed mobility: Needs Assistance Bed Mobility: Supine to Sit;Sit to Supine     Supine to sit: Total assist Sit to supine: Total assist   General bed mobility comments: Patient leans posteriorly quite heavily throughout transfer, minimal participation by patient. Unable to tolerate sitting for more than 30".   Transfers                    Ambulation/Gait                Stairs            Wheelchair Mobility    Modified Rankin (Stroke Patients Only)       Balance Overall balance assessment: Needs assistance Sitting-balance support: No upper extremity supported Sitting balance-Leahy Scale: Zero   Postural control: Posterior lean                                   Pertinent Vitals/Pain Pain  Assessment: Faces Faces Pain Scale: Hurts little more Pain Location: Unclear, heels?  Pain Intervention(s): Limited activity within patient's tolerance    Home Living Family/patient expects to be discharged to:: Skilled nursing facility                      Prior Function Level of Independence: Independent with assistive device(s)         Comments: Per old notes he was ambulating with a RW at some point in the past, but likely not recently.      Hand Dominance        Extremity/Trunk Assessment   Upper Extremity Assessment Upper Extremity Assessment: Generalized weakness    Lower Extremity Assessment Lower Extremity Assessment: Generalized weakness       Communication   Communication: HOH;Expressive difficulties  Cognition Arousal/Alertness: Awake/alert Behavior During Therapy: Flat affect Overall Cognitive Status: Difficult to assess                                 General Comments: Minimal responses ("no") to most questions.       General Comments General comments (skin integrity, edema, etc.): Pads on heels. Foley catheter in place.     Exercises General  Exercises - Lower Extremity Heel Slides: AAROM;Both;10 reps Hip ABduction/ADduction: AAROM;10 reps Straight Leg Raises: AAROM;10 reps   Assessment/Plan    PT Assessment Patient needs continued PT services  PT Problem List Decreased strength;Decreased mobility;Decreased safety awareness;Decreased range of motion;Decreased cognition;Cardiopulmonary status limiting activity;Decreased skin integrity;Decreased balance;Decreased knowledge of use of DME       PT Treatment Interventions DME instruction;Therapeutic activities;Gait training;Therapeutic exercise;Patient/family education;Stair training;Balance training;Neuromuscular re-education    PT Goals (Current goals can be found in the Care Plan section)  Acute Rehab PT Goals PT Goal Formulation: Patient unable to participate in goal  setting Time For Goal Achievement: 02/19/18 Potential to Achieve Goals: Poor    Frequency Min 2X/week   Barriers to discharge        Co-evaluation               AM-PAC PT "6 Clicks" Daily Activity  Outcome Measure Difficulty turning over in bed (including adjusting bedclothes, sheets and blankets)?: Unable Difficulty moving from lying on back to sitting on the side of the bed? : Unable Difficulty sitting down on and standing up from a chair with arms (e.g., wheelchair, bedside commode, etc,.)?: Unable Help needed moving to and from a bed to chair (including a wheelchair)?: Total Help needed walking in hospital room?: Total Help needed climbing 3-5 steps with a railing? : Total 6 Click Score: 6    End of Session   Activity Tolerance: Patient limited by fatigue Patient left: in bed;with call bell/phone within reach Nurse Communication: Mobility status PT Visit Diagnosis: Muscle weakness (generalized) (M62.81);History of falling (Z91.81)    Time: 9794-8016 PT Time Calculation (min) (ACUTE ONLY): 16 min   Charges:   PT Evaluation $PT Eval Moderate Complexity: 1 Mod     PT G Codes:   PT G-Codes **NOT FOR INPATIENT CLASS** Functional Assessment Tool Used: AM-PAC 6 Clicks Basic Mobility    Royce Macadamia PT, DPT, CSCS    02/05/2018, 12:07 PM

## 2018-02-05 NOTE — Progress Notes (Signed)
Nutrition Follow-up  DOCUMENTATION CODES:   Severe malnutrition in context of chronic illness, Underweight  INTERVENTION:  Will downgrade diet to dysphagia 2 with thin liquids in setting of poor dentition.  Increase to Ensure Enlive po TID, each supplement provides 350 kcal and 20 grams of protein. Patient prefers strawberry.  Continue Magic cup TID with meals, each supplement provides 290 kcal and 9 grams of protein.  Will discontinue Juven as patient does not like them and is refusing to drink.  Noted phosphorus came back this morning as 1.6 mg/dL. Recommend supplementing and following up with another lab in AM.  Continue daily MVI.  Noted patient has now had a bowel movement documented this admission. Recommend adding scheduled bowel regimen.  NUTRITION DIAGNOSIS:   Severe Malnutrition related to chronic illness(recurrent CLL) as evidenced by severe fat depletion, severe muscle depletion.  Ongoing.  GOAL:   Patient will meet greater than or equal to 90% of their needs  Not met - patient has very poor PO intake.  MONITOR:   PO intake, Supplement acceptance, Diet advancement, Labs, Weight trends, Skin, I & O's  REASON FOR ASSESSMENT:   Consult Assessment of nutrition requirement/status, Poor PO, Wound healing  ASSESSMENT:   84 year old male with PMHx of CLL, DM type 2, anemia, neuromuscular disorder admitted from [place] with acute on chronic hypoxic respiratory failure secondary to influenza A and LLL PNA.   Patient was initially seen on 2/13 as a consult for assessment of nutrition requirements/status. RD now received consult for assess of nutrition requirements/status, poor PO, and wound healing.  Met with patient at bedside. No family members present at time of RD assessment and none were present on initial assessment. Patient reports his appetite is the same. He reports he really likes the strawberry milkshakes (Ensure). He also likes ice cream,  yogurt, and pudding.  Meal Completion: mainly 0-15%; yesterday at dinner he had only a yogurt; today at breakfast he had 1/2 [employer]; he later had one bottle of Ensure Enlive strawberry  Medications reviewed and include: Marinol 2.5 mg daily before lunch, Novolog 0-20 units Q4hrs, MVI daily, NS @ 75 mL/hr, cefepime, vancomycin.  Labs reviewed: CBG 222-447, Potassium 3.3, BUN 21, Phosphorus 1.6.  Discussed with RN and with MD. Plan is to downgrade diet in setting of poor dentition and remove carbohydrate restriction currently in place. Son had told care team when he was here that patient is usually on a chopped diet. Patient was started on Marinol.  Diet Order:  Diet Carb Modified Fluid consistency: Thin; Room service appropriate? Yes  EDUCATION NEEDS:   Not appropriate for education at this time  Skin:  Skin Assessment: Skin Integrity Issues: Skin Integrity Issues:: DTI, Stage I, Stage II DTI: right heel Stage I: left heel Stage II: b/l sacrum  Last BM:  Unknown  Height:   Ht Readings from Last 1 Encounters:  01/31/18 _0  (1.778 m)    Weight:   Wt Readings from Last 1 Encounters:  02/04/18 124 lb 5.4 oz (56.4 kg)    Ideal Body Weight:  75.5 kg  BMI:  Body mass index is 17.84 kg/m.  Estimated Nutritional Needs:   Kcal:  1650-1905 (MSJ x 1.3-1.5)  Protein:  85-95 grams (1.5-1.7 grams/kg)  Fluid:  1.4-1.7 L/day (25-30 mL/kg)  Willey Blade, MS, RD, LDN Office: [phone] Pager: [phone] After Hours/Weekend Pager: [phone]

## 2018-02-05 NOTE — Clinical Social Work Note (Signed)
CSW met with the patient at bedside to discuss discharge planning. The patient confirmed that he is a long-term care resident of [facility]. The CSW will complete a full assessment when able to reach the family.  Santiago Bumpers, MSW, Latanya Presser [phone]

## 2018-02-05 NOTE — Progress Notes (Signed)
Wesley Mcguire: Wesley Mcguire    MR#:  [id number]  DATE OF BIRTH:  1931/04/28  SUBJECTIVE:   Patient resting comfortably.  Still with decreased appetite   REVIEW OF SYSTEMS:    Review of Systems  Constitutional: Negative for fever, chills weight loss positive for generalized weakness HENT: Negative for ear pain, nosebleeds, congestion, facial swelling, rhinorrhea, neck pain, neck stiffness and ear discharge.   Respiratory: Cough and denies shortness of breath Cardiovascular: Negative for chest pain, palpitations and leg swelling.  Gastrointestinal: Negative for heartburn, abdominal pain, vomiting, diarrhea or consitpation Genitourinary: Negative for dysuria, urgency, frequency, hematuria Musculoskeletal: Negative for back pain or joint pain Neurological: Negative for dizziness, seizures, syncope, focal weakness,  numbness and headaches.  Hematological: Does not bruise/bleed easily.  Psychiatric/Behavioral: Negative for hallucinations, positive confusion, dysphoric mood    Tolerating Diet: Not eating very much      DRUG ALLERGIES:  No Known Allergies  VITALS:  Blood pressure (!) 106/48, pulse 64, temperature 97.6 F (36.4 C), temperature source Oral, resp. rate 17, height 5\' 10"  (1.778 m), weight 56.4 kg (124 lb 5.4 oz), SpO2 96 %.  PHYSICAL EXAMINATION:  Constitutional: Appears thin frail . No distress. HENT: Normocephalic. Marland Kitchen Oropharynx is clear and moist.  Eyes: Conjunctivae and EOM are normal. PERRLA, no scleral icterus.  Neck: Normal ROM. Neck supple. No JVD. No tracheal deviation. CVS: RRR, S1/S2 +, no murmurs, no gallops, no carotid bruit.  Pulmonary: Effort and breath sounds normal, no stridor, rhonchi, wheezes, rales.  Abdominal: Soft. BS +,  no distension, tenderness, rebound or guarding.  Musculoskeletal: Normal range of motion. No edema and no tenderness.  Neuro: Alert. CN 2-12 grossly intact. No focal  deficits. Skin: Skin is warm and dry. No rash noted. Psychiatric: confused a bit this am.      LABORATORY PANEL:   CBC Recent Labs  Lab 02/05/18 0622  WBC 7.7  HGB 9.6*  HCT 30.4*  PLT 215   ------------------------------------------------------------------------------------------------------------------  Chemistries  Recent Labs  Lab 01/31/18 1454  02/05/18 0513  NA 136   < > 142  K 4.4   < > 3.3*  CL 101   < > 109  CO2 26   < > 25  GLUCOSE 173*   < > 295*  BUN 17   < > 21*  CREATININE 0.66   < > 0.71  CALCIUM 8.6*   < > 8.1*  MG  --   --  2.1  AST 29  --   --   ALT 13*  --   --   ALKPHOS 141*  --   --   BILITOT 1.6*  --   --    < > = values in this interval not displayed.   ------------------------------------------------------------------------------------------------------------------  Cardiac Enzymes Recent Labs  Lab 01/31/18 1454  TROPONINI 0.03*   ------------------------------------------------------------------------------------------------------------------  RADIOLOGY:  No results found.   ASSESSMENT AND PLAN:   84 year old male from [place] with a history of diabetes who presents with shortness of breath.  1. Acute on chronic hypoxic respiratory failure in the setting of HCAP and influenza A Patient continues to improve Patient is on baseline oxygen   2. Sepsis due to HCAP: Patient presented with fever, tachycardia and tachypnea along with leukocytosis  Continue cefepime and vancomycin MRSA PCR is positive Initial blood cultures were felt to be contaminant Repeat blood cultures Patient has been weaned off of pressors Hydrocortisone needs to be  weaned  3. Severe protein calorie malnutrition with poor by mouth intake: Continue feeding supplements and Marinol for appetite stimulant Palliative care consultation would benefit this patient. 4. Influenza A: Treated   5. Elevated troponin due to demand ischemia. Patient has  ruled out for ACS  6. Diabetes: Due to hypoglycemia with poor by mouth intake diabetic medications are discontinued  7. History of CLL: Patient is followed by oncologist  8. Hypokalemia: This needs to be repleted  Patient would benefit from palliative care consultation  Management plans discussed with the patient and he is in agreement. Overall poor prognosis due to poor appetite PT evaluation   CODE STATUS: DO NOT RESUSCITATE  TOTAL TIME TAKING CARE OF THIS PATIENT: 22 minutes.     POSSIBLE D/C 2-3 days, DEPENDING ON CLINICAL CONDITION.   [name] M.D on 02/05/2018 at 11:33 AM  Between 7am to 6pm - Pager - [phone] After 6pm go to [email or web address] - password [email or web address] [facility]  Office  [phone]  CC: Primary care physician; System, Pcp Not In  Note: This dictation was prepared with Dragon dictation along with smaller phrase technology. Any transcriptional errors that result from this process are unintentional.

## 2018-02-05 NOTE — Consult Note (Signed)
   Name: Wesley Mcguire MRN: [id number] DOB: 06-22-1931    ADMISSION DATE:  01/31/2018 CONSULTATION DATE: 01/31/2018  REFERRING MD : Dr. Benjie Karvonen   CHIEF COMPLAINT: follow up Shortness of Breath   BRIEF PATIENT DESCRIPTION: 84 yo male admitted with acute on chronic hypoxic respiratory failure secondary to influenza A and LLL pneumonia requiring Bipap   Patient has done well and has not required BIPAP in at least 48 hours.  SIGNIFICANT EVENTS  02/12-Pt admitted to stepdown unit   STUDIES:  None   HISTORY OF PRESENT ILLNESS:   Has remained on 2 liters [place] without comrpromise. More awake but still coughing. resp distress- improved +FLU Very poor appetite   REVIEW OF SYSTEMS:  NAD except for poor appetite  SUBJECTIVE:  Pt states he is still short of breath.  VITAL SIGNS: Temp:  [97.6 F (36.4 C)-97.7 F (36.5 C)] 97.7 F (36.5 C) (02/17 1200) Pulse Rate:  [37-90] 59 (02/17 1435) Resp:  [15-32] 23 (02/17 1435) BP: (88-132)/(40-72) 120/57 (02/17 1430) SpO2:  [92 %-99 %] 99 % (02/17 1435)  PHYSICAL EXAMINATION: General: no acute distress  Neuro: alert oriented to self and place, follows commands  HEENT: supple, no JVD  Cardiovascular: sinus rhythm, rrr, no M/R/G Lungs: CTA, even, non labored  Abdomen: +BS x4, soft, non tender, non distended  Musculoskeletal: normal bulk and tone, no edema  Skin: stage II pressure ulcer sacral spine, stage I pressure ulcer left heel, and deep tissue injury right heel   Recent Labs  Lab 02/02/18 0544 02/03/18 0819 02/05/18 0513  NA 142 138 142  K 4.2 4.3 3.3*  CL 110 107 109  CO2 25 25 25   BUN 13 10 21*  CREATININE 0.48* 0.36* 0.71  GLUCOSE 84 146* 295*   Recent Labs  Lab 02/01/18 0521 02/03/18 0819 02/05/18 0622  HGB 10.0* 10.6* 9.6*  HCT 31.4* 33.1* 30.4*  WBC 20.6* 15.1* 7.7  PLT 239 280 215    ASSESSMENT / PLAN: 1. Acute on chronic hypoxic respiratory failure secondary to influenza A and LLL pneumonia,   Continues to improve and now with less Oxygen requirement and has remained off BIPAP for at least 48 hours Lactic Acidosis-resolved 2. Mildly elevated troponin likely demand ischemia in setting of respiratory failure  3. Critical illness Anemia without obvious acute blood loss 4. Pressure Ulcers   5. Adrenal Insufficiency- BP has improved since starting Hydrocortisone and he is no longer dependant on Vasopressor. 6. Hx: CLL and Diabetes Mellitus  7. Severe Protein malnutrition with poor appetite 8. Hypokalemia 9. Hypophosphatemia  P:  Continue on O2 as patient is chronically on Oygen Continue scheduled bronchodilator therapy  Continue nebulized  Droplet precautions  Continuous telemetry monitoring   Full up repeated Blood cultures as it appears to have been contaminant  Continue current abx Lovenox for VTE prophylaxis  Trend CBC Monitor for s/sx of bleeding and transfuse for hgb <7 SSI;  Wean Hydrocortisone to 50 Q 12 today Turn pt q2hrs  Will add nutritional supplements, add low dose Marinol for appetite stimulant Electrolytes replacement. Decrease IVF to maintenance dose   Cammie Sickle M.D.  [place]

## 2018-02-06 DIAGNOSIS — J9601 Acute respiratory failure with hypoxia: Secondary | ICD-10-CM

## 2018-02-06 LAB — GLUCOSE, CAPILLARY
GLUCOSE-CAPILLARY: 161 mg/dL — AB (ref 65–99)
GLUCOSE-CAPILLARY: 186 mg/dL — AB (ref 65–99)
Glucose-Capillary: 189 mg/dL — ABNORMAL HIGH (ref 65–99)
Glucose-Capillary: 241 mg/dL — ABNORMAL HIGH (ref 65–99)
Glucose-Capillary: 283 mg/dL — ABNORMAL HIGH (ref 65–99)
Glucose-Capillary: 305 mg/dL — ABNORMAL HIGH (ref 65–99)

## 2018-02-06 LAB — RENAL FUNCTION PANEL
ANION GAP: 7 (ref 5–15)
Albumin: 2.5 g/dL — ABNORMAL LOW (ref 3.5–5.0)
BUN: 20 mg/dL (ref 6–20)
CHLORIDE: 108 mmol/L (ref 101–111)
CO2: 28 mmol/L (ref 22–32)
Calcium: 8 mg/dL — ABNORMAL LOW (ref 8.9–10.3)
Creatinine, Ser: 0.44 mg/dL — ABNORMAL LOW (ref 0.61–1.24)
GFR calc non Af Amer: >60 mL/min (ref >60)
GLUCOSE: 179 mg/dL — AB (ref 65–99)
Phosphorus: 1.9 mg/dL — ABNORMAL LOW (ref 2.5–4.6)
Potassium: 2.7 mmol/L — CL (ref 3.5–5.1)
Sodium: 143 mmol/L (ref 135–145)

## 2018-02-06 MED ORDER — INSULIN ASPART 100 UNIT/ML ~~LOC~~ SOLN
0.0000 [IU] | Freq: Three times a day (TID) | SUBCUTANEOUS | Status: DC
  Administered 2018-02-06: 11 [IU] via SUBCUTANEOUS
  Administered 2018-02-07: 2 [IU] via SUBCUTANEOUS
  Administered 2018-02-07: 8 [IU] via SUBCUTANEOUS
  Administered 2018-02-07: 5 [IU] via SUBCUTANEOUS
  Administered 2018-02-08: 18:00:00 3 [IU] via SUBCUTANEOUS
  Administered 2018-02-08: 13:00:00 2 [IU] via SUBCUTANEOUS
  Administered 2018-02-09: 3 [IU] via SUBCUTANEOUS
  Filled 2018-02-06 (×7): qty 1

## 2018-02-06 MED ORDER — POTASSIUM CHLORIDE CRYS ER 20 MEQ PO TBCR
40.0000 meq | EXTENDED_RELEASE_TABLET | Freq: Once | ORAL | Status: AC
  Administered 2018-02-06: 40 meq via ORAL
  Filled 2018-02-06: qty 2

## 2018-02-06 MED ORDER — INSULIN ASPART 100 UNIT/ML ~~LOC~~ SOLN
0.0000 [IU] | Freq: Every day | SUBCUTANEOUS | Status: DC
  Administered 2018-02-06 – 2018-02-07 (×2): 3 [IU] via SUBCUTANEOUS
  Filled 2018-02-06 (×2): qty 1

## 2018-02-06 MED ORDER — POTASSIUM CHLORIDE 10 MEQ/100ML IV SOLN
10.0000 meq | INTRAVENOUS | Status: AC
  Administered 2018-02-06 (×6): 10 meq via INTRAVENOUS
  Filled 2018-02-06 (×6): qty 100

## 2018-02-06 NOTE — Progress Notes (Signed)
[facility] Progress Note Patient Name: LOMAN LOGAN DOB: [birth year] MRN: [id number]   Date of Service  02/06/2018  HPI/Events of Note  K+ 2.7 and Creatinine = 0.44.  eICU Interventions  Will replace K+.     Intervention Category Major Interventions: Electrolyte abnormality - evaluation and management  Loleta Frommelt [place] 02/06/2018, 6:25 AM

## 2018-02-06 NOTE — Progress Notes (Signed)
[facility] NAME: Mahad Newstrom    MR#:  [id number]  DATE OF BIRTH:  January 09, 1931  SUBJECTIVE:   Patient with poor by mouth intake. Patient states he does not want to eat. He wants to give up.  REVIEW OF SYSTEMS:    Review of Systems  Constitutional: Negative for fever, chills weight loss positive for generalized weakness HENT: Negative for ear pain, nosebleeds, congestion, facial swelling, rhinorrhea, neck pain, neck stiffness and ear discharge.   Respiratory: Denies shortness of breath or cough  Cardiovascular: Negative for chest pain, palpitations and leg swelling.  Gastrointestinal: Negative for heartburn, abdominal pain, vomiting, diarrhea or consitpation Genitourinary: Negative for dysuria, urgency, frequency, hematuria Musculoskeletal: Negative for back pain or joint pain Neurological: Negative for dizziness, seizures, syncope, focal weakness,  numbness and headaches.  Hematological: Does not bruise/bleed easily.  Psychiatric/Behavioral: Negative for hallucinations, positive dysphoric mood    Tolerating Diet: Not eating very much      DRUG ALLERGIES:  No Known Allergies  VITALS:  Blood pressure (!) 109/54, pulse 60, temperature (!) 97.4 F (36.3 C), temperature source Oral, resp. rate 17, height 5\' 10"  (1.778 m), weight 57.9 kg (127 lb 10.3 oz), SpO2 99 %.  PHYSICAL EXAMINATION:  Constitutional: Appears thin frail . No distress. HENT: Normocephalic. Marland Kitchen Oropharynx is clear and moist.  Eyes: Conjunctivae and EOM are normal. PERRLA, no scleral icterus.  Neck: Normal ROM. Neck supple. No JVD. No tracheal deviation. CVS: RRR, S1/S2 +, no murmurs, no gallops, no carotid bruit.  Pulmonary: Effort and breath sounds normal, no stridor, rhonchi, wheezes, rales.  Abdominal: Soft. BS +,  no distension, tenderness, rebound or guarding.  Musculoskeletal: Normal range of motion. No edema and no tenderness.  Neuro: Alert. CN  2-12 grossly intact. No focal deficits. Skin: Skin is warm and dry. No rash noted. Psychiatric: some confusion    LABORATORY PANEL:   CBC Recent Labs  Lab 02/05/18 0622  WBC 7.7  HGB 9.6*  HCT 30.4*  PLT 215   ------------------------------------------------------------------------------------------------------------------  Chemistries  Recent Labs  Lab 01/31/18 1454  02/05/18 0513 02/06/18 0520  NA 136   < > 142 143  K 4.4   < > 3.3* 2.7*  CL 101   < > 109 108  CO2 26   < > 25 28  GLUCOSE 173*   < > 295* 179*  BUN 17   < > 21* 20  CREATININE 0.66   < > 0.71 0.44*  CALCIUM 8.6*   < > 8.1* 8.0*  MG  --   --  2.1  --   AST 29  --   --   --   ALT 13*  --   --   --   ALKPHOS 141*  --   --   --   BILITOT 1.6*  --   --   --    < > = values in this interval not displayed.   ------------------------------------------------------------------------------------------------------------------  Cardiac Enzymes Recent Labs  Lab 01/31/18 1454  TROPONINI 0.03*   ------------------------------------------------------------------------------------------------------------------  RADIOLOGY:  No results found.   ASSESSMENT AND PLAN:   84 year old male from [place] with a history of diabetes who presents with shortness of breath.  1. Acute on chronic hypoxic respiratory failure in the setting of HCAP and influenza A Patient continues to show signs of improvement in respiratory status Patient is on baseline oxygen   2. Sepsis due to HCAP: Patient presented with fever,  tachycardia and tachypnea along with leukocytosis  Continue cefepime and vancomycin MRSA PCR is positive Initial blood cultures were felt to be contaminant Repeat blood cultures are negative to date Patient has been weaned off of pressors Hydrocortisone will need to be weaned again today  3. Severe protein calorie malnutrition with poor by mouth intake: Continue feeding supplements and Marinol  for appetite stimulant   4. Influenza A: Treated   5. Elevated troponin due to demand ischemia. Patient has ruled out for ACS  6. Diabetes: Due to hypoglycemia with poor by mouth intake diabetic medications are discontinued  7. History of CLL: Patient is followed by oncologist  8. Hypokalemia/hypophosphatemia: Replete as per pharmacy protocol Palliative care consultation requested as patient has poor by mouth intake and adult failure to thrive   Management plans discussed with the patient and he is in agreement.  Overall poor prognosis due to poor appetite PT evaluation   CODE STATUS: DO NOT RESUSCITATE  TOTAL TIME TAKING CARE OF THIS PATIENT: 22 minutes.     POSSIBLE D/C 2-3 days, DEPENDING ON CLINICAL CONDITION.   Glendy Barsanti M.D on 02/06/2018 at 11:18 AM  Between 7am to 6pm - Pager - [phone] After 6pm go to [email or web address] - password [email or web address] [facility]  Office  [phone]  CC: Primary care physician; System, Pcp Not In  Note: This dictation was prepared with Dragon dictation along with smaller phrase technology. Any transcriptional errors that result from this process are unintentional.

## 2018-02-06 NOTE — Progress Notes (Addendum)
Comfortable on room air No complaints of pain or dyspnea  Vitals:   02/06/18 1000 02/06/18 1100 02/06/18 1200 02/06/18 1300  BP: (!) 127/47 (!) 101/46 (!) 115/54 133/64  Pulse: (!) 55 70 60 61  Resp: 17 17 15 14   Temp:   98.4 F (36.9 C)   TempSrc:   Oral   SpO2: 100% 98% 98% 99%  Weight:      Height:        Gen: NAD HEENT: NCAT, sclerae white, oropharynx normal Neck: No LAN, no JVD noted Lungs: full BS, no wheezes Cardiovascular: Regular, no M noted Abdomen: Soft, NT, +BS Ext: no C/C/E Neuro: grossly intact   BMP Latest Ref Rng & Units 02/06/2018 02/05/2018 02/03/2018  Glucose 65 - 99 mg/dL 179(H) 295(H) 146(H)  BUN 6 - 20 mg/dL 20 21(H) 10  Creatinine 0.61 - 1.24 mg/dL 0.44(L) 0.71 0.36(L)  Sodium 135 - 145 mmol/L 143 142 138  Potassium 3.5 - 5.1 mmol/L 2.7(LL) 3.3(L) 4.3  Chloride 101 - 111 mmol/L 108 109 107  CO2 22 - 32 mmol/L 28 25 25   Calcium 8.9 - 10.3 mg/dL 8.0(L) 8.1(L) 7.7(L)    CBC Latest Ref Rng & Units 02/05/2018 02/03/2018 02/01/2018  WBC 3.8 - 10.6 K/uL 7.7 15.1(H) 20.6(H)  Hemoglobin 13.0 - 18.0 g/dL 9.6(L) 10.6(L) 10.0(L)  Hematocrit 40.0 - 52.0 % 30.4(L) 33.1(L) 31.4(L)  Platelets 150 - 440 K/uL 215 280 239    No new chest x-ray  IMP: Acute hypoxemic respiratory failure, resolving Influenza A positive Hypotension, resolved Hypokalemia ICU acquired anemia without evidence of acute blood loss Concern for adrenal insufficiency CLL  PLAN/REC: Transferred to [place] vancomycin Monitor BMET intermittently Monitor I/Os Correct electrolytes as indicated  Continue hydrocortisone - dose reduced 02/18. Wean to off as permitted by BP Consider formal testing of adrenal function with cosyntropin stim test after hydrocortisone off Recheck CXR in AM 02/19 After transfer, PCCM will sign off. Please call if we can be of further assistance    Merton Border, MD PCCM service Mobile [phone] Pager [phone] 02/06/2018 3:56 PM

## 2018-02-06 NOTE — Progress Notes (Addendum)
Pt has had a good day. Has had more intake today than yesterday. VS WNL. On room air at this time. Does have red area on right upper arm. Area marked this morning and this afternoon. Pt states that is tender when touched. No fever, no broken skin within the area.

## 2018-02-06 NOTE — Progress Notes (Addendum)
CRITICAL VALUE ALERT  Critical Value:  Potassium 2.7  Date & Time Notied:  02/06/18 at 0624  Provider Notified: Dr. Oletta Darter  Orders Received/Actions taken: Acknowledged and orders placed for replacement.

## 2018-02-07 ENCOUNTER — Inpatient Hospital Stay: Payer: Medicare Other

## 2018-02-07 DIAGNOSIS — Z7189 Other specified counseling: Secondary | ICD-10-CM

## 2018-02-07 DIAGNOSIS — Z515 Encounter for palliative care: Secondary | ICD-10-CM

## 2018-02-07 DIAGNOSIS — J189 Pneumonia, unspecified organism: Secondary | ICD-10-CM

## 2018-02-07 LAB — BASIC METABOLIC PANEL
Anion gap: 6 (ref 5–15)
BUN: 20 mg/dL (ref 6–20)
CHLORIDE: 105 mmol/L (ref 101–111)
CO2: 31 mmol/L (ref 22–32)
CREATININE: 0.36 mg/dL — AB (ref 0.61–1.24)
Calcium: 7.9 mg/dL — ABNORMAL LOW (ref 8.9–10.3)
GFR calc non Af Amer: >60 mL/min (ref >60)
Glucose, Bld: 311 mg/dL — ABNORMAL HIGH (ref 65–99)
POTASSIUM: 3.3 mmol/L — AB (ref 3.5–5.1)
SODIUM: 142 mmol/L (ref 135–145)

## 2018-02-07 LAB — GLUCOSE, CAPILLARY
GLUCOSE-CAPILLARY: 284 mg/dL — AB (ref 65–99)
GLUCOSE-CAPILLARY: 164 mg/dL — AB (ref 65–99)
GLUCOSE-CAPILLARY: 134 mg/dL — AB (ref 65–99)
Glucose-Capillary: 251 mg/dL — ABNORMAL HIGH (ref 65–99)

## 2018-02-07 LAB — PHOSPHORUS: Phosphorus: 1.9 mg/dL — ABNORMAL LOW (ref 2.5–4.6)

## 2018-02-07 MED ORDER — INSULIN DETEMIR 100 UNIT/ML ~~LOC~~ SOLN
5.0000 [IU] | Freq: Every day | SUBCUTANEOUS | Status: DC
  Administered 2018-02-07 – 2018-02-08 (×2): 5 [IU] via SUBCUTANEOUS
  Filled 2018-02-07 (×3): qty 0.05

## 2018-02-07 MED ORDER — HYDROCORTISONE NA SUCCINATE PF 100 MG IJ SOLR
50.0000 mg | Freq: Every day | INTRAMUSCULAR | Status: DC
Start: 2018-02-08 — End: 2018-02-08
  Filled 2018-02-07: qty 1

## 2018-02-07 NOTE — Progress Notes (Signed)
Pt transferred to Rm 111 as ordered.  Report called to receiving nurse.  Pt's son notified of move and room number.  Pt left floor via bed with all personal belongings.

## 2018-02-07 NOTE — Progress Notes (Signed)
Physical Therapy Treatment Patient Details Name: Wesley Mcguire MRN: [id number] DOB: [birth year] Today's Date: 02/07/2018    History of Present Illness Patient is an 84 y/o male that presents with shortness of breath, was found to have O2 sats in the low 80s at [facility]. Found to have HCAP and influenza A.    PT Comments    Wesley Mcguire made progress with mobility this session.  He was much more cooperative this session but very fearful of falling with supine>sit and sitting EOB. He required max assist for supine>sit but was able to assist with this minimally by pulling up into sitting. Pt sitting EOB with strong posterior lean to start, requiring max assist to prevent posterior LOB.  Pt provided with verbal cues to lean anteriorly and had pt reach out with RUE to encourage abdominal engagement.  Pt then provided with 1 person HHA to allow pt to pull into upright sitting and then slowly hand lowered to bed with pt sitting EOB for up to 10 seconds at a time x4 bouts. Pt fatigues quickly with this.  SNF recommendation remains appropriate at this time.    Follow Up Recommendations  SNF     Equipment Recommendations  Other (comment)(TBD at next venue of care)    Recommendations for Other Services       Precautions / Restrictions Precautions Precautions: Fall Restrictions Weight Bearing Restrictions: No    Mobility  Bed Mobility Overal bed mobility: Needs Assistance Bed Mobility: Supine to Sit;Sit to Supine     Supine to sit: Max assist;+2 for physical assistance;HOB elevated Sit to supine: +2 for physical assistance;Total assist   General bed mobility comments: Pt able to assist minimally with supine>sit this session as pt provided with 1 person HHA to pull up into sitting with assist provided to bring BLEs to EOB with support to elevate trunk.  Pt demonstrates strong posterior lean with significant fear of falling.  To return to supine the pt requires total assist for  all aspects of bed mobility.   Transfers                 General transfer comment: Not safe to attempt at this time  Ambulation/Gait                 Stairs            Wheelchair Mobility    Modified Rankin (Stroke Patients Only)       Balance Overall balance assessment: Needs assistance Sitting-balance support: Single extremity supported;Feet supported Sitting balance-Leahy Scale: Poor Sitting balance - Comments: Pt improved sitting balance this session.  Pt able to sit EOB for up to 10 seconds at a times with min guard assist.  Postural control: Posterior lean                                  Cognition Arousal/Alertness: Awake/alert Behavior During Therapy: Flat affect Overall Cognitive Status: No family/caregiver present to determine baseline cognitive functioning                                 General Comments: Pt follows one step commands but demonstrates short term memory deficits with reminders provided for task at hand.       Exercises General Exercises - Upper Extremity Shoulder Flexion: AAROM;Left;AROM;Right;10 reps;Supine General Exercises - Lower Extremity Ankle Circles/Pumps: AROM;Both;5  reps;Supine Quad Sets: Strengthening;Both;10 reps;Supine Heel Slides: Strengthening;AROM;Both;10 reps;Supine Hip ABduction/ADduction: AAROM;Strengthening;Both;10 reps;Supine Straight Leg Raises: AAROM;Strengthening;Both;10 reps;Supine;AROM Other Exercises Other Exercises: Pt sitting EOB with strong posterior lean to start, requiring max assist to prevent posterior LOB.  Pt provided with verbal cues to lean anteriorly and had pt reach out with RUE to encourage abdominal engagement.  Pt then provided with 1 person HHA to allow pt to pull into upright sitting and then slowly hand lowered to bed with pt sitting EOB for up to 10 seconds at a time x4 bouts. Pt fatigues quickly with this.     General Comments        Pertinent  Vitals/Pain Pain Assessment: Faces Faces Pain Scale: Hurts little more Pain Location: initially with movement of LUE but then is fine after several reps Pain Descriptors / Indicators: Grimacing;Moaning Pain Intervention(s): Limited activity within patient's tolerance;Monitored during session    Home Living                      Prior Function            PT Goals (current goals can now be found in the care plan section) Acute Rehab PT Goals Patient Stated Goal: unable to state, confused PT Goal Formulation: Patient unable to participate in goal setting Time For Goal Achievement: 02/19/18 Potential to Achieve Goals: Fair Progress towards PT goals: Progressing toward goals    Frequency    Min 2X/week      PT Plan Current plan remains appropriate    Co-evaluation              AM-PAC PT "6 Clicks" Daily Activity  Outcome Measure  Difficulty turning over in bed (including adjusting bedclothes, sheets and blankets)?: Unable Difficulty moving from lying on back to sitting on the side of the bed? : Unable Difficulty sitting down on and standing up from a chair with arms (e.g., wheelchair, bedside commode, etc,.)?: Unable Help needed moving to and from a bed to chair (including a wheelchair)?: Total Help needed walking in hospital room?: Total Help needed climbing 3-5 steps with a railing? : Total 6 Click Score: 6    End of Session   Activity Tolerance: Patient limited by fatigue Patient left: in bed;with call bell/phone within reach;with bed alarm set;Other (comment)(prevalon boots BLE) Nurse Communication: Mobility status;Other (comment)(pt with BM) PT Visit Diagnosis: Muscle weakness (generalized) (M62.81);History of falling (Z91.81)     Time: 1025-1050 PT Time Calculation (min) (ACUTE ONLY): 25 min  Charges:  $Therapeutic Activity: 23-37 mins                    G Codes:       Collie Siad PT, DPT 02/07/2018, 11:20 AM

## 2018-02-07 NOTE — Progress Notes (Signed)
Inpatient Diabetes Program Recommendations  AACE/ADA: New Consensus Statement on Inpatient Glycemic Control (2015)  Target Ranges:  Prepandial:   less than 140 mg/dL      Peak postprandial:   less than 180 mg/dL (1-2 hours)      Critically ill patients:  140 - 180 mg/dL   Lab Results  Component Value Date   GLUCAP 284 (H) 02/07/2018   HGBA1C 7.0 (H) 01/31/2018    Review of Glycemic Control  Results for KEETON, KASSEBAUM (MRN [id number]) as of 02/07/2018 08:36  Ref. Range 02/06/2018 07:17 02/06/2018 11:12 02/06/2018 16:24 02/06/2018 21:42 02/07/2018 07:43  Glucose-Capillary Latest Ref Range: 65 - 99 mg/dL 186 (H) 189 (H) 305 (H) 283 (H) 284 (H)   Diabetes history: Type 2  Outpatient Diabetes medications: Levemir 10 units qhs, Novolog 5 units qday.   Current orders for Inpatient glycemic control: Novolog 0-15 units tid, Novolog 0-5 units qhs  Inpatient Diabetes Program Recommendations: CBG consistently above 180mg /dl- consider ordering a portion of the patient's home basal insulin - Consider Levemir 5 units qhs  Gentry Fitz, RN, [place], [place], CDE Diabetes Coordinator Inpatient Diabetes Program  [phone] (Team Pager) [phone] ([place]) 02/07/2018 8:40 AM

## 2018-02-07 NOTE — Progress Notes (Signed)
Patient is floor status.  Was not seen officially by PCCM service on this day.  Deadrick Stidd, MD PCCM service Mobile [phone] Pager [phone] 02/07/2018 3:35 PM  

## 2018-02-07 NOTE — Consult Note (Signed)
Consultation Note Date: 02/07/2018   Patient Name: Wesley Mcguire  DOB: 1931-12-09  MRN: [id number]  Age / Sex: 84 y.o., male  PCP: System, Pcp Not In Referring Physician: Bettey Costa, MD  Reason for Consultation: Establishing goals of care  HPI/Patient Profile: 84 y.o. male  with past medical history of CLL (currently on Ibrutinib), DM, chronic anemia, humerus fracture,  admitted on 01/31/2018 with increasing SOB, hypoxia, requiring bipap. Tested positivie for influenza A and chest xray showed pneumonia. He was cachectic, (albumin 2.5), not eating, has multiple pressure injuries to sacrum, R heel, L heel. Now on  and improving. Palliative medicine consulted for [place]. Per chart review he has previously been on hospice in 2016.    Clinical Assessment and Goals of Care:  I have reviewed medical records including EPIC notes, labs and imaging, and assessed the patient. He was in bed, resting comfortably. Tells me he lives at [facility] (per chart review he was admitted from [facility]), he cannot tell me why he is in the hospital. I attempted to elicit values and goals of care important to the patient.   He doesn't engage in conversation, most of his answers are one word, or "I don't know". He cannot tell me what year it is.  He is unable to participate in [place] conversation. I called that patient's son and left a message requesting a call to further discuss [place].  Primary Decision Maker NEXT OF KIN - patient's son- Nathanel Tallman    SUMMARY OF RECOMMENDATIONS   -Continue current level of care -PMT will attempt to reach patient's son for [place] discussion  Code Status/Advance Care Planning:  DNR   Primary Diagnoses: Present on Admission: . Influenza A   I have reviewed the medical record, interviewed the patient and family, and examined the patient. The following aspects are  pertinent.  Past Medical History:  Diagnosis Date  . Anemia   . CLL (chronic lymphocytic leukemia) ([place])   . Diabetes mellitus without complication ([place])   . Humerus fracture   . Leukemia ([place])   . Neuromuscular disorder (H[facility])    muscle weakness   Social History   Socioeconomic History  . Marital status: Divorced    Spouse name: None  . Number of children: None  . Years of education: None  . Highest education level: None  Social Needs  . Financial resource strain: None  . Food insecurity - worry: None  . Food insecurity - inability: None  . Transportation needs - medical: None  . Transportation needs - non-medical: None  Occupational History  . None  Tobacco Use  . Smoking status: Never Smoker  . Smokeless tobacco: Never Used  Substance and Sexual Activity  . Alcohol use: No  . Drug use: No  . Sexual activity: No  Other Topics Concern  . None  Social History Narrative  . None   Family History  Problem Relation Age of Onset  . Cancer Sister    Scheduled Meds: . chlorhexidine  15 mL Mouth  Rinse BID  . dronabinol  2.5 mg Oral QAC lunch  . enoxaparin (LOVENOX) injection  40 mg Subcutaneous Q24H  . feeding supplement (ENSURE ENLIVE)  237 mL Oral TID BM  . hydrocortisone sod succinate (SOLU-CORTEF) inj  50 mg Intravenous Q12H  . Ibrutinib  420 mg Oral Daily  . insulin aspart  0-15 Units Subcutaneous TID WC  . insulin aspart  0-5 Units Subcutaneous QHS  . mouth rinse  15 mL Mouth Rinse q12n4p  . multivitamin with minerals  1 tablet Oral Daily   Continuous Infusions: . sodium chloride 0.9 % 1,000 mL with potassium PHOSPHATE 20 mmol infusion 35 mL/hr at 02/07/18 1100   PRN Meds:.acetaminophen **OR** [DISCONTINUED] acetaminophen, alum & mag hydroxide-simeth, chlorpheniramine-HYDROcodone, ipratropium-albuterol, nitroGLYCERIN, [DISCONTINUED] ondansetron **OR** ondansetron (ZOFRAN) IV, polyethylene glycol, traMADol Medications Prior to Admission:  Prior to Admission  medications   Medication Sig Start Date End Date Taking? Authorizing Provider  guaiFENesin (MUCINEX) 600 MG 12 hr tablet Take 600 mg by mouth 2 (two) times daily.   Yes [provider]  Ibrutinib 420 MG TABS Take 420 mg by mouth daily. 12/29/17  Yes Cammie Sickle, MD  insulin aspart (NOVOLOG) 100 UNIT/ML injection Inject 5 Units daily into the skin.    Yes [provider]  insulin detemir (LEVEMIR) 100 UNIT/ML injection Inject 10 Units into the skin at bedtime.    Yes [provider]  Multiple Vitamin (MULTIVITAMIN) tablet Take 1 tablet by mouth daily.   Yes [provider]  oseltamivir (TAMIFLU) 75 MG capsule Take 75 mg by mouth daily.   Yes [provider]  aluminum-magnesium hydroxide-simethicone (MAALOX) 200-200-20 MG/5ML SUSP Take 30 mLs by mouth every 6 (six) hours as needed (for indigestion/heartburn).    [provider]  collagenase (SANTYL) ointment Apply 1 application topically daily.    [provider]  feeding supplement, GLUCERNA SHAKE, (GLUCERNA SHAKE) LIQD Take 237 mLs by mouth 4 (four) times daily -  with meals and at bedtime.    [provider]  glucagon (GLUCAGEN) 1 MG SOLR injection Inject 1 mg into the vein once as needed for low blood sugar.    [provider]  nitroGLYCERIN (NITROSTAT) 0.4 MG SL tablet Place 0.4 mg under the tongue every 5 (five) minutes x 3 doses as needed for chest pain.     [provider]   No Known Allergies Review of Systems  Unable to perform ROS: Dementia    Physical Exam  Constitutional:  Cachetic   Cardiovascular: Normal rate and regular rhythm.  Pulmonary/Chest: Effort normal.  Neurological: He is alert.  Not oriented to time  Skin:  Reddened area on R arm  Nursing note and vitals reviewed.   Vital Signs: BP (!) 103/50   Pulse (!) 58   Temp (!) 97.2 F (36.2 C) (Oral)   Resp 19   Ht 5\' 10"  (1.778 m)   Wt 57.9 kg (127 lb 10.3 oz)    SpO2 98%   BMI 18.32 kg/m  Pain Assessment: No/denies pain   Pain Score: Asleep   SpO2: SpO2: 98 % O2 Device:SpO2: 98 % O2 Flow Rate: .O2 Flow Rate (L/min): 2 L/min  IO: Intake/output summary:   Intake/Output Summary (Last 24 hours) at 02/07/2018 1105 Last data filed at 02/07/2018 1100 Gross per 24 hour  Intake 1390 ml  Output 2840 ml  Net -1450 ml    LBM: Last BM Date: (patient unable to advise) Baseline Weight: Weight: 59 kg (130 lb) Most  recent weight: Weight: 57.9 kg (127 lb 10.3 oz)     Palliative Assessment/Data: PPS: 20%     Thank you for this consult. Palliative medicine will continue to follow and assist as needed.   Time In: 1030 Time Out: 1130 Time Total: 60 minutes Greater than 50%  of this time was spent counseling and coordinating care related to the above assessment and plan.  Signed by: Mariana Kaufman, AGNP-C Palliative Medicine    Please contact Palliative Medicine Team phone at [phone] for questions and concerns.  For individual provider: See Shea Evans

## 2018-02-07 NOTE — Progress Notes (Signed)
[facility] [place] NAME: Wesley Mcguire    MR#:  [id number]  DATE OF BIRTH:  September 03, 1931  SUBJECTIVE:   Patient was able to work with physical therapy this morning. He continues to have poor by mouth intake.  REVIEW OF SYSTEMS:    Review of Systems  Constitutional: Negative for fever, chills weight loss positive for generalized weakness HENT: Negative for ear pain, nosebleeds, congestion, facial swelling, rhinorrhea, neck pain, neck stiffness and ear discharge.   Respiratory: Denies shortness of breath or cough  Cardiovascular: Negative for chest pain, palpitations and leg swelling.  Gastrointestinal: Negative for heartburn, abdominal pain, vomiting, diarrhea or consitpation Genitourinary: Negative for dysuria, urgency, frequency, hematuria Musculoskeletal: Negative for back pain or joint pain Neurological: Negative for dizziness, seizures, syncope, focal weakness,  numbness and headaches.  Hematological: Does not bruise/bleed easily.  Psychiatric/Behavioral: Negative for hallucinations, positive dysphoric mood    Tolerating Diet: Not eating very much      DRUG ALLERGIES:  No Known Allergies  VITALS:  Blood pressure (!) 103/50, pulse (!) 58, temperature (!) 97.2 F (36.2 C), temperature source Oral, resp. rate 19, height 5\' 10"  (1.778 m), weight 57.9 kg (127 lb 10.3 oz), SpO2 98 %.  PHYSICAL EXAMINATION:  Constitutional: Appears thin frail . No distress. HENT: Normocephalic. Marland Kitchen Oropharynx is clear and moist.  Eyes: Conjunctivae and EOM are normal. PERRLA, no scleral icterus.  Neck: Normal ROM. Neck supple. No JVD. No tracheal deviation. CVS: RRR, S1/S2 +, no murmurs, no gallops, no carotid bruit.  Pulmonary: Effort and breath sounds normal, no stridor, rhonchi, wheezes, rales.  Abdominal: Soft. BS +,  no distension, tenderness, rebound or guarding.  Musculoskeletal: Normal range of motion. No edema and no tenderness.  Neuro:  Alert. CN 2-12 grossly intact. No focal deficits. Skin: Skin is warm and dry. No rash noted. Psychiatric: some confusion    LABORATORY PANEL:   CBC Recent Labs  Lab 02/05/18 0622  WBC 7.7  HGB 9.6*  HCT 30.4*  PLT 215   ------------------------------------------------------------------------------------------------------------------  Chemistries  Recent Labs  Lab 01/31/18 1454  02/05/18 0513  02/07/18 0543  NA 136   < > 142   < > 142  K 4.4   < > 3.3*   < > 3.3*  CL 101   < > 109   < > 105  CO2 26   < > 25   < > 31  GLUCOSE 173*   < > 295*   < > 311*  BUN 17   < > 21*   < > 20  CREATININE 0.66   < > 0.71   < > 0.36*  CALCIUM 8.6*   < > 8.1*   < > 7.9*  MG  --   --  2.1  --   --   AST 29  --   --   --   --   ALT 13*  --   --   --   --   ALKPHOS 141*  --   --   --   --   BILITOT 1.6*  --   --   --   --    < > = values in this interval not displayed.   ------------------------------------------------------------------------------------------------------------------  Cardiac Enzymes Recent Labs  Lab 01/31/18 1454  TROPONINI 0.03*   ------------------------------------------------------------------------------------------------------------------  RADIOLOGY:  Dg Chest Port 1 View  Result Date: 02/07/2018 CLINICAL DATA:  Respiratory failure EXAM: PORTABLE CHEST 1 VIEW COMPARISON:  02/01/2018 FINDINGS: Low lung volumes. New layering right effusion with right lower lobe atelectasis or infiltrate. Continued left basilar atelectasis or infiltrate with some improved aeration in the left lung since prior study. 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 IMPRESSION: Worsening aeration on the right with new layering right effusion and right lower lobe atelectasis or infiltrate. Left basilar atelectasis or infiltrate with some improvement in aeration on the left. Electronically Signed   By: Rolm Baptise M.D.   On: 02/07/2018 07:21     ASSESSMENT AND PLAN:    84 year old male from [place] with a history of diabetes who presents with shortness of breath.  1. Acute on chronic hypoxic respiratory failure in the setting of HCAP and influenza A Patient continues to show signs of improvement in respiratory status  Patient is on baseline oxygen   2. Sepsis due to HCAP: Patient presented with fever, tachycardia and tachypnea along with leukocytosis  He has completed treatment with cefepime and vancomycin MRSA PCR was positive. Initial blood cultures were felt to be contaminant Repeat blood cultures are negative. Patient has been weaned off of pressors Hydrocortisone will be weaned to daily and then hopefully off tomorrow.   3. Severe protein calorie malnutrition with poor by mouth intake: Continue feeding supplements and Marinol for appetite stimulant   4. Influenza A: Treated   5. Elevated troponin due to demand ischemia. Patient has ruled out for ACS  6. Diabetes: Due to hypoglycemia with poor by mouth intake diabetic medications are discontinued  7. History of CLL: Patient is followed by oncologist  8. Hypokalemia/hypophosphatemia: Replete as per pharmacy protocol   Palliative care nurse has seen patient. He is not able to participate in goals of care conversation. Apparently they left message for patient's son.   Management plans discussed with the patient and he is in agreement.  Overall poor prognosis due to poor appetite PT evaluation is recommending skilled nursing facility upon discharge   CODE STATUS: DO NOT RESUSCITATE  TOTAL TIME TAKING CARE OF THIS PATIENT: 22 minutes.     POSSIBLE D/C 2-3 days, DEPENDING ON CLINICAL CONDITION.   Iza Preston M.D on 02/07/2018 at 11:52 AM  Between 7am to 6pm - Pager - [phone] After 6pm go to [email or web address] - password [email or web address] [facility]  Office  [phone]  CC: Primary care physician; System, Pcp Not In  Note: This dictation was  prepared with Dragon dictation along with smaller phrase technology. Any transcriptional errors that result from this process are unintentional.

## 2018-02-08 LAB — BASIC METABOLIC PANEL
ANION GAP: 5 (ref 5–15)
BUN: 28 mg/dL — ABNORMAL HIGH (ref 6–20)
CO2: 34 mmol/L — AB (ref 22–32)
Calcium: 7.9 mg/dL — ABNORMAL LOW (ref 8.9–10.3)
Chloride: 104 mmol/L (ref 101–111)
Creatinine, Ser: 0.53 mg/dL — ABNORMAL LOW (ref 0.61–1.24)
GFR calc Af Amer: >60 mL/min (ref >60)
GFR calc non Af Amer: >60 mL/min (ref >60)
GLUCOSE: 114 mg/dL — AB (ref 65–99)
POTASSIUM: 3.1 mmol/L — AB (ref 3.5–5.1)
Sodium: 143 mmol/L (ref 135–145)

## 2018-02-08 LAB — GLUCOSE, CAPILLARY
GLUCOSE-CAPILLARY: 156 mg/dL — AB (ref 65–99)
GLUCOSE-CAPILLARY: 184 mg/dL — AB (ref 65–99)
Glucose-Capillary: 90 mg/dL (ref 65–99)
Glucose-Capillary: 92 mg/dL (ref 65–99)
Glucose-Capillary: 127 mg/dL — ABNORMAL HIGH (ref 65–99)

## 2018-02-08 LAB — PHOSPHORUS: PHOSPHORUS: 2.3 mg/dL — AB (ref 2.5–4.6)

## 2018-02-08 LAB — MAGNESIUM: MAGNESIUM: 2.1 mg/dL (ref 1.7–2.4)

## 2018-02-08 MED ORDER — IBRUTINIB 420 MG PO TABS
420.0000 mg | ORAL_TABLET | Freq: Every day | ORAL | Status: DC
  Administered 2018-02-08 – 2018-02-09 (×2): 420 mg via ORAL
  Filled 2018-02-08 (×4): qty 1

## 2018-02-08 MED ORDER — POTASSIUM CHLORIDE CRYS ER 20 MEQ PO TBCR
40.0000 meq | EXTENDED_RELEASE_TABLET | Freq: Once | ORAL | Status: AC
Start: 2018-02-08 — End: 2018-02-08
  Administered 2018-02-08: 40 meq via ORAL
  Filled 2018-02-08: qty 2

## 2018-02-08 MED ORDER — HYDROCORTISONE NA SUCCINATE PF 100 MG IJ SOLR
25.0000 mg | Freq: Every day | INTRAMUSCULAR | Status: DC
  Administered 2018-02-08 – 2018-02-09 (×2): 25 mg via INTRAVENOUS
  Filled 2018-02-08 (×2): qty 0.5

## 2018-02-08 NOTE — Care Management Important Message (Signed)
Important Message  Patient Details  Name: Wesley Mcguire MRN: [id number] Date of Birth: 1931-10-26   Medicare Important Message Given:  Yes    Shelbie Ammons, RN 02/08/2018, 7:13 AM

## 2018-02-08 NOTE — Progress Notes (Signed)
Pharmacy Electrolyte Monitoring Consult:  Pharmacy consulted to assist in monitoring and replacing electrolytes in this 84 y.o. male admitted on 01/31/2018 with Respiratory Distress and Code Sepsis   Labs:  Sodium (mmol/L)  Date Value  02/08/2018 143  06/04/2012 143   Potassium (mmol/L)  Date Value  02/08/2018 3.1 (L)  06/04/2012 4.2   Magnesium (mg/dL)  Date Value  02/05/2018 2.1   Phosphorus (mg/dL)  Date Value  02/07/2018 1.9 (L)   Calcium (mg/dL)  Date Value  02/08/2018 7.9 (L)   Calcium, Total (mg/dL)  Date Value  06/04/2012 8.6   Albumin (g/dL)  Date Value  02/06/2018 2.5 (L)  06/04/2012 3.4    Plan: Patient receiving IVF with 20 mmol potassium phos at 35 ml/hr providing 16.8 mmol phos and 24.6 meq K daily. Will replace K with 40 meq KCl po once and add-on phos and mag to AM labs.   Napoleon Form 02/08/2018 8:38 AM

## 2018-02-08 NOTE — Progress Notes (Signed)
Daily Progress Note   Patient Name: Wesley Mcguire       Date: 02/08/2018 DOB: [birth year]  Age: 84 y.o. MRN#: [id number] Attending Physician: Bettey Costa, MD Primary Care Physician: System, Pcp Not In Admit Date: 01/31/2018  Reason for Consultation/Follow-up: Establishing goals of care  Subjective: Patient in room. Alert. Enjoying watching [employer]. He doesn't want to interact much. His son was to meet me at 3pm but did not arrive.  Patient states he is amenable with plans to discharge to facility.  Chart review notes that he has had poor po intake for quite sometime since being on CLL treatment. However, he has not had significant change in his weight.   ROS  Length of Stay: 8  Current Medications: Scheduled Meds:  . chlorhexidine  15 mL Mouth Rinse BID  . dronabinol  2.5 mg Oral QAC lunch  . enoxaparin (LOVENOX) injection  40 mg Subcutaneous Q24H  . feeding supplement (ENSURE ENLIVE)  237 mL Oral TID BM  . hydrocortisone sod succinate (SOLU-CORTEF) inj  25 mg Intravenous Daily  . Ibrutinib  420 mg Oral Daily  . insulin aspart  0-15 Units Subcutaneous TID WC  . insulin aspart  0-5 Units Subcutaneous QHS  . insulin detemir  5 Units Subcutaneous QHS  . mouth rinse  15 mL Mouth Rinse q12n4p  . multivitamin with minerals  1 tablet Oral Daily    Continuous Infusions: . sodium chloride 0.9 % 1,000 mL with potassium PHOSPHATE 20 mmol infusion 35 mL/hr at 02/07/18 2209    PRN Meds: acetaminophen **OR** [DISCONTINUED] acetaminophen, alum & mag hydroxide-simeth, chlorpheniramine-HYDROcodone, ipratropium-albuterol, nitroGLYCERIN, [DISCONTINUED] ondansetron **OR** ondansetron (ZOFRAN) IV, polyethylene glycol, traMADol  Physical Exam  Constitutional:  cachetic  Pulmonary/Chest: Effort  normal.  Neurological: He is alert.  Skin: There is pallor.  Nursing note and vitals reviewed.           Vital Signs: BP 135/74 (BP Location: Left Arm)   Pulse 61   Temp 98.6 F (37 C)   Resp 16   Ht 5\' 6"  (1.676 m)   Wt 59.8 kg (131 lb 13.4 oz)   SpO2 97%   BMI 21.28 kg/m  SpO2: SpO2: 97 % O2 Device: O2 Device: Not Delivered O2 Flow Rate: O2 Flow Rate (L/min): 2 L/min  Intake/output summary:   Intake/Output Summary (  Last 24 hours) at 02/08/2018 1515 Last data filed at 02/08/2018 0500 Gross per 24 hour  Intake 610 ml  Output 575 ml  Net 35 ml   LBM: Last BM Date: 02/07/18 Baseline Weight: Weight: 59 kg (130 lb) Most recent weight: Weight: 59.8 kg (131 lb 13.4 oz)       Palliative Assessment/Data:PPS: 30%      Patient Active Problem List   Diagnosis Date Noted  . HCAP (healthcare-associated pneumonia)   . Goals of care, counseling/discussion   . Palliative care by specialist   . Influenza A 01/31/2018  . Pressure injury of skin 01/31/2018  . CLL (chronic lymphocytic leukemia) ([place]) 12/27/2017  . Difficulty in urination 06/28/2017  . Prostate enlargement 06/28/2017  . Protein-calorie malnutrition, severe 11/22/2015  . Hyperkalemia 11/21/2015    Palliative Care Assessment & Plan   Patient Profile: 84 y.o. male  with past medical history of CLL (currently on Ibrutinib), DM, chronic anemia, humerus fracture,  admitted on 01/31/2018 with increasing SOB, hypoxia, requiring bipap. Tested positivie for influenza A and chest xray showed pneumonia. He was cachectic, (albumin 2.5), not eating, has multiple pressure injuries to sacrum, R heel, L heel. Now on [employer] and improving. Palliative medicine consulted for [place]. Per chart review he has previously been on hospice in 2016.  Assessment/Recommendations/Plan   PMT will call patient's son and recommend outpatient Palliative to follow at facility  Hard Choices book left at bedside  Goals of Care and Additional  Recommendations:  Limitations on Scope of Treatment: Full Scope Treatment  Code Status:  DNR  Prognosis:   Unable to determine  Discharge Planning:  [place] for rehab with Palliative care service follow-up   Thank you for allowing the Palliative Medicine Team to assist in the care of this patient.   Time In: 1500 Time Out: 1515 Total Time 15 mins Prolonged Time Billed no      Greater than 50%  of this time was spent counseling and coordinating care related to the above assessment and plan.  Mariana Kaufman, AGNP-C Palliative Medicine   Please contact Palliative Medicine Team phone at [phone] for questions and concerns.

## 2018-02-08 NOTE — Progress Notes (Signed)
Pharmacy Electrolyte Monitoring Consult:  Pharmacy consulted to assist in monitoring and replacing electrolytes in this 84 y.o. male admitted on 01/31/2018 with Respiratory Distress and Code Sepsis   Labs:  Sodium (mmol/L)  Date Value  02/08/2018 143  06/04/2012 143   Potassium (mmol/L)  Date Value  02/08/2018 3.1 (L)  06/04/2012 4.2   Magnesium (mg/dL)  Date Value  02/08/2018 2.1   Phosphorus (mg/dL)  Date Value  02/08/2018 2.3 (L)   Calcium (mg/dL)  Date Value  02/08/2018 7.9 (L)   Calcium, Total (mg/dL)  Date Value  06/04/2012 8.6   Albumin (g/dL)  Date Value  02/06/2018 2.5 (L)  06/04/2012 3.4    Plan: Patient receiving IVF with 20 mmol potassium phos at 35 ml/hr providing 16.8 mmol phos and 24.6 meq K daily. Will replace K with 40 meq KCl po once and add-on phos and mag to AM labs.  2/20 @ 1300: add-on phos slightly low. Will not supplement as maintenance IVF with Kphos still running. Will f/u AM labs.   Napoleon Form 02/08/2018 12:58 PM

## 2018-02-08 NOTE — Progress Notes (Signed)
[facility] [place] NAME: Wesley Mcguire    MR#:  [id number]  DATE OF BIRTH:  1931-06-11  SUBJECTIVE:   No acute events overnight. Patient still with poor overall intake.  REVIEW OF SYSTEMS:    Review of Systems  Constitutional: Negative for fever, chills weight loss positive for generalized weakness HENT: Negative for ear pain, nosebleeds, congestion, facial swelling, rhinorrhea, neck pain, neck stiffness and ear discharge.   Respiratory: Denies shortness of breath or cough  Cardiovascular: Negative for chest pain, palpitations and leg swelling.  Gastrointestinal: Negative for heartburn, abdominal pain, vomiting, diarrhea or consitpation Genitourinary: Negative for dysuria, urgency, frequency, hematuria Musculoskeletal: Negative for back pain or joint pain Neurological: Negative for dizziness, seizures, syncope, focal weakness,  numbness and headaches.  Hematological: Does not bruise/bleed easily.  Psychiatric/Behavioral: Negative for hallucinations, flat affect    Tolerating Diet: Not eating very much      DRUG ALLERGIES:  No Known Allergies  VITALS:  Blood pressure 135/74, pulse 61, temperature 98.6 F (37 C), resp. rate 16, height 5\' 6"  (1.676 m), weight 59.8 kg (131 lb 13.4 oz), SpO2 97 %.  PHYSICAL EXAMINATION:  Constitutional: Appears thin frail . No distress. HENT: Normocephalic. Marland Kitchen Oropharynx is clear and moist.  Eyes: Conjunctivae and EOM are normal. PERRLA, no scleral icterus.  Neck: Normal ROM. Neck supple. No JVD. No tracheal deviation. CVS: RRR, S1/S2 +, no murmurs, no gallops, no carotid bruit.  Pulmonary: Effort and breath sounds normal, no stridor, rhonchi, wheezes, rales.  Abdominal: Soft. BS +,  no distension, tenderness, rebound or guarding.  Musculoskeletal: Normal range of motion. No edema and no tenderness.  Neuro: Alert. CN 2-12 grossly intact. No focal deficits. Skin: Skin is warm and dry. No rash  noted. Psychiatric: some confusion    LABORATORY PANEL:   CBC Recent Labs  Lab 02/05/18 0622  WBC 7.7  HGB 9.6*  HCT 30.4*  PLT 215   ------------------------------------------------------------------------------------------------------------------  Chemistries  Recent Labs  Lab 02/08/18 0649  NA 143  K 3.1*  CL 104  CO2 34*  GLUCOSE 114*  BUN 28*  CREATININE 0.53*  CALCIUM 7.9*  MG 2.1   ------------------------------------------------------------------------------------------------------------------  Cardiac Enzymes No results for input(s): TROPONINI in the last 168 hours. ------------------------------------------------------------------------------------------------------------------  RADIOLOGY:  Dg Chest Port 1 View  Result Date: 02/07/2018 CLINICAL DATA:  Respiratory failure EXAM: PORTABLE CHEST 1 VIEW COMPARISON:  02/01/2018 FINDINGS: Low lung volumes. New layering right effusion with right lower lobe atelectasis or infiltrate. Continued left basilar atelectasis or infiltrate with some improved aeration in the left lung since prior study. 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 IMPRESSION: Worsening aeration on the right with new layering right effusion and right lower lobe atelectasis or infiltrate. Left basilar atelectasis or infiltrate with some improvement in aeration on the left. Electronically Signed   By: Rolm Baptise M.D.   On: 02/07/2018 07:21     ASSESSMENT AND PLAN:   84 year old male from [place] with a history of diabetes who presents with shortness of breath.  1. Acute on chronic hypoxic respiratory failure in the setting of HCAP and influenza A Patient is actually on room air.   2. Sepsis due to HCAP: Patient presented with fever, tachycardia and tachypnea along with leukocytosis  He has completed treatment with cefepime and vancomycin MRSA PCR was positive. Initial blood cultures were felt to be  contaminant Repeat blood cultures are negative. Patient has been weaned off of pressors  Hydrocortisone will be weaned to daily and then hopefully off tomorrow.   3. Severe protein calorie malnutrition with poor by mouth intake: Continue feeding supplements and Marinol for appetite stimulant   4. Influenza A: Treated   5. Elevated troponin due to demand ischemia. Patient has ruled out for ACS  6. Diabetes: Due to hypoglycemia with poor by mouth intake diabetic medications are discontinued  7. History of CLL: Patient is followed by oncologist  8. Hypokalemia/hypophosphatemia: Replete as per pharmacy protocol   Palliative care nurse has seen patient. He is not able to participate in goals of care conversation.  Family meeting planned for today with palliative care   Management plans discussed with the patient and he is in agreement.  Overall poor prognosis due to poor appetite PT evaluation is recommending skilled nursing facility upon discharge   CODE STATUS: DO NOT RESUSCITATE  TOTAL TIME TAKING CARE OF THIS PATIENT: 22 minutes.     POSSIBLE D/C tomorrow DEPENDING ON CLINICAL CONDITION.   Wesley Mcguire M.D on 02/08/2018 at 9:44 AM  Between 7am to 6pm - Pager - [phone] After 6pm go to [email or web address] - password [email or web address] [facility]  Office  [phone]  CC: Primary care physician; System, Pcp Not In  Note: This dictation was prepared with Dragon dictation along with smaller phrase technology. Any transcriptional errors that result from this process are unintentional.

## 2018-02-08 NOTE — Progress Notes (Signed)
Physical Therapy Treatment Patient Details Name: Wesley Mcguire MRN: [id number] DOB: [birth year] Today's Date: 02/08/2018    History of Present Illness Patient is an 84 y/o male that presents with shortness of breath, was found to have O2 sats in the low 80s at [facility]. Found to have HCAP and influenza A.     PT Comments    Pt is making gradual progress towards goals with ability to participate in limited there-ex, limiting by fatigue. Pt confused however pleasant and agreeable to therapy. Improved seated balance noted by decreased physical assist needed to maintain upright balance. Will continue to progress as able.   Follow Up Recommendations  SNF     Equipment Recommendations       Recommendations for Other Services       Precautions / Restrictions Precautions Precautions: Fall Restrictions Weight Bearing Restrictions: No    Mobility  Bed Mobility Overal bed mobility: Needs Assistance Bed Mobility: Supine to Sit;Sit to Supine     Supine to sit: Max assist Sit to supine: Max assist   General bed mobility comments: able to initiate sliding B LEs off bed. Once seated, post tilt of pelvis noted and pt with post leaning. Required mod assist for upright balance, progressing to cga. Pt fatigued quickly with seated ther-ex and only able to sit at EOB grossly 3 mins. Decreased eccentric control back to bed, needs total assist to return supine and slide towards HOB. Further mobility not appropriate  Transfers                 General transfer comment: Not safe to attempt at this time  Ambulation/Gait                 Stairs            Wheelchair Mobility    Modified Rankin (Stroke Patients Only)       Balance                                            Cognition Arousal/Alertness: Awake/alert Behavior During Therapy: Flat affect Overall Cognitive Status: No family/caregiver present to determine baseline cognitive  functioning                                 General Comments: pt follows commands and agreeable to therapy      Exercises Other Exercises Other Exercises: Supine ther-ex performed including B LE SLRs, heel slides, and hip abd/add. ONce seated at EOB, able to perform B LE LAQ and B UE reaching for glove taps x 5 reps on R UE. Unable to participate on L UE, in guarded position. Also worked on seated balance    General Comments        Pertinent Vitals/Pain Pain Assessment: No/denies pain    Home Living                      Prior Function            PT Goals (current goals can now be found in the care plan section) Acute Rehab PT Goals Patient Stated Goal: unable to state, confused PT Goal Formulation: Patient unable to participate in goal setting Time For Goal Achievement: 02/19/18 Potential to Achieve Goals: Fair Progress towards PT goals: Progressing toward goals  Frequency    Min 2X/week      PT Plan Current plan remains appropriate    Co-evaluation              AM-PAC PT "6 Clicks" Daily Activity  Outcome Measure  Difficulty turning over in bed (including adjusting bedclothes, sheets and blankets)?: Unable Difficulty moving from lying on back to sitting on the side of the bed? : Unable Difficulty sitting down on and standing up from a chair with arms (e.g., wheelchair, bedside commode, etc,.)?: Unable Help needed moving to and from a bed to chair (including a wheelchair)?: Total Help needed walking in hospital room?: Total Help needed climbing 3-5 steps with a railing? : Total 6 Click Score: 6    End of Session   Activity Tolerance: Patient limited by fatigue Patient left: in bed;with call bell/phone within reach;with bed alarm set;Other (comment) Nurse Communication: Mobility status;Other (comment) PT Visit Diagnosis: Muscle weakness (generalized) (M62.81);History of falling (Z91.81)     Time: 0254-2706 PT Time  Calculation (min) (ACUTE ONLY): 11 min  Charges:  $Therapeutic Activity: 8-22 mins                    G Codes:       Greggory Stallion, PT, DPT [phone]    Evanee Lubrano 02/08/2018, 5:26 PM

## 2018-02-09 DIAGNOSIS — J9601 Acute respiratory failure with hypoxia: Secondary | ICD-10-CM

## 2018-02-09 DIAGNOSIS — Z7189 Other specified counseling: Secondary | ICD-10-CM

## 2018-02-09 LAB — BASIC METABOLIC PANEL
Anion gap: 6 (ref 5–15)
BUN: 34 mg/dL — AB (ref 6–20)
CHLORIDE: 107 mmol/L (ref 101–111)
CO2: 31 mmol/L (ref 22–32)
CREATININE: 0.54 mg/dL — AB (ref 0.61–1.24)
Calcium: 7.7 mg/dL — ABNORMAL LOW (ref 8.9–10.3)
GFR calc Af Amer: >60 mL/min (ref >60)
GFR calc non Af Amer: >60 mL/min (ref >60)
GLUCOSE: 123 mg/dL — AB (ref 65–99)
Potassium: 3.6 mmol/L (ref 3.5–5.1)
Sodium: 144 mmol/L (ref 135–145)

## 2018-02-09 LAB — GLUCOSE, CAPILLARY
Glucose-Capillary: 81 mg/dL (ref 65–99)
Glucose-Capillary: 161 mg/dL — ABNORMAL HIGH (ref 65–99)

## 2018-02-09 LAB — PHOSPHORUS: Phosphorus: 2.8 mg/dL (ref 2.5–4.6)

## 2018-02-09 MED ORDER — DRONABINOL 2.5 MG PO CAPS: 2.5000 mg | ORAL_CAPSULE | Freq: Every day | ORAL | Status: DC

## 2018-02-09 NOTE — Discharge Summary (Addendum)
[facility] [facility] [place] NAME: Wesley Mcguire    MR#:  [id number]  DATE OF BIRTH:  December 27, 1930  DATE OF ADMISSION:  01/31/2018   ADMITTING PHYSICIAN: Bettey Costa, MD  DATE OF DISCHARGE: 02/09/2018 PRIMARY CARE PHYSICIAN: System, Pcp Not In   ADMISSION DIAGNOSIS:  Influenza A [J10.1] Acute respiratory failure with hypoxia ([place]) [J96.01] HCAP (healthcare-associated pneumonia) [J18.9] Sepsis, due to unspecified organism ([place]) [A41.9] DISCHARGE DIAGNOSIS:  Active Problems:   Influenza A   Pressure injury of skin   HCAP (healthcare-associated pneumonia)   Goals of care, counseling/discussion   Palliative care by specialist  SECONDARY DIAGNOSIS:   Past Medical History:  Diagnosis Date  . Anemia   . CLL (chronic lymphocytic leukemia) ([place])   . Diabetes mellitus without complication ([place])   . Humerus fracture   . Leukemia ([place])   . Neuromuscular disorder ([place])    muscle weakness   HOSPITAL COURSE:    84 year old male from [place] with a history of diabetes who presents with shortness of breath.  1. Acute on chronic hypoxic respiratory failure in the setting of HCAP and influenza A, improved. Patient is actually on room air.   2. Sepsis due to HCAP: Patient presented with fever, tachycardia and tachypnea along with leukocytosis  He has completed treatment with cefepime and vancomycin MRSA PCR was positive. Initial blood cultures were felt to be contaminant Repeat blood cultures are negative. Patient has been weaned off of pressors Hydrocortisone was weaned off.   3. Severe protein calorie malnutrition with poor by mouth intake: Continue feeding supplements. He was given Marinol for appetite stimulant.  His oral intake is better.  Hold Marinol.  4. Influenza A: Treated  5. Elevated troponin due to demand ischemia. Patient has ruled out for ACS  6. Diabetes: Due to hypoglycemia with poor by mouth intake  diabetic medications are discontinued  7. History of CLL: Patient is followed by oncologist  8. Hypokalemia/hypophosphatemia: Replete as per pharmacy protocol  Palliative care follow up in SNF. DISCHARGE CONDITIONS:  Stable, discharge to SNF today. CONSULTS OBTAINED:   DRUG ALLERGIES:  No Known Allergies DISCHARGE MEDICATIONS:   Allergies as of 02/09/2018   No Known Allergies     Medication List    STOP taking these medications   guaiFENesin 600 MG 12 hr tablet Commonly known as:  MUCINEX   insulin detemir 100 UNIT/ML injection Commonly known as:  LEVEMIR   oseltamivir 75 MG capsule Commonly known as:  TAMIFLU     TAKE these medications   aluminum-magnesium hydroxide-simethicone [id number] MG/5ML Susp Commonly known as:  MAALOX Take 30 mLs by mouth every 6 (six) hours as needed (for indigestion/heartburn).   collagenase ointment Commonly known as:  SANTYL Apply 1 application topically daily.   feeding supplement (GLUCERNA SHAKE) Liqd Take 237 mLs by mouth 4 (four) times daily -  with meals and at bedtime.   glucagon 1 MG Solr injection Commonly known as:  GLUCAGEN Inject 1 mg into the vein once as needed for low blood sugar.   Ibrutinib 420 MG Tabs Take 420 mg by mouth daily.   insulin aspart 100 UNIT/ML injection Commonly known as:  novoLOG Inject 5 Units daily into the skin.   multivitamin tablet Take 1 tablet by mouth daily.   nitroGLYCERIN 0.4 MG SL tablet Commonly known as:  NITROSTAT Place 0.4 mg under the tongue every 5 (five) minutes x 3 doses as needed for chest pain.  DISCHARGE INSTRUCTIONS:  See AVS.   If you experience worsening of your admission symptoms, develop shortness of breath, life threatening emergency, suicidal or homicidal thoughts you must seek medical attention immediately by calling 911 or calling your MD immediately  if symptoms less severe.  You Must read complete instructions/literature along with all the  possible adverse reactions/side effects for all the Medicines you take and that have been prescribed to you. Take any new Medicines after you have completely understood and accpet all the possible adverse reactions/side effects.   Please note  You were cared for by a hospitalist during your hospital stay. If you have any questions about your discharge medications or the care you received while you were in the hospital after you are discharged, you can call the unit and asked to speak with the hospitalist on call if the hospitalist that took care of you is not available. Once you are discharged, your primary care physician will handle any further medical issues. Please note that NO REFILLS for any discharge medications will be authorized once you are discharged, as it is imperative that you return to your primary care physician (or establish a relationship with a primary care physician if you do not have one) for your aftercare needs so that they can reassess your need for medications and monitor your lab values.    On the day of Discharge:  VITAL SIGNS:  Blood pressure (!) 109/59, pulse 60, temperature 98.2 F (36.8 C), temperature source Oral, resp. rate 16, height 5\' 6"  (1.676 m), weight 131 lb 13.4 oz (59.8 kg), SpO2 95 %. PHYSICAL EXAMINATION:  GENERAL:  84 y.o.-year-old patient lying in the bed with no acute distress. Malnutrition. EYES: Pupils equal, round, reactive to light and accommodation. No scleral icterus. Extraocular muscles intact.  HEENT: Head atraumatic, normocephalic. Oropharynx and nasopharynx clear.  NECK:  Supple, no jugular venous distention. No thyroid enlargement, no tenderness.  LUNGS: Normal breath sounds bilaterally, no wheezing, rales,rhonchi or crepitation. No use of accessory muscles of respiration.  CARDIOVASCULAR: S1, S2 normal. No murmurs, rubs, or gallops.  ABDOMEN: Soft, non-tender, non-distended. Bowel sounds present. No organomegaly or mass.  EXTREMITIES: No  pedal edema, cyanosis, or clubbing.  NEUROLOGIC: Cranial nerves II through XII are intact. Muscle strength 5/5 in all extremities. Sensation intact. Gait not checked.  PSYCHIATRIC: The patient is alert and oriented x 2.  SKIN: No obvious rash, lesion, or ulcer.  DATA REVIEW:   CBC Recent Labs  Lab 02/05/18 0622  WBC 7.7  HGB 9.6*  HCT 30.4*  PLT 215    Chemistries  Recent Labs  Lab 02/08/18 0649 02/09/18 0415  NA 143 144  K 3.1* 3.6  CL 104 107  CO2 34* 31  GLUCOSE 114* 123*  BUN 28* 34*  CREATININE 0.53* 0.54*  CALCIUM 7.9* 7.7*  MG 2.1  --      Microbiology Results  Results for orders placed or performed during the hospital encounter of 01/31/18  Culture, blood (Routine x 2)     Status: Abnormal   Collection Time: 01/31/18  2:55 PM  Result Value Ref Range Status   Specimen Description   Final    BLOOD RIGHT HAND Performed at [facility], [address] [place], [place] [address]    Special Requests   Final    BOTTLES DRAWN AEROBIC AND ANAEROBIC Blood Culture adequate volume Performed at [facility], [address] [place], [place] [address]    Culture  Setup Time  Final    GRAM POSITIVE COCCI AEROBIC BOTTLE ONLY CRITICAL RESULT CALLED TO, READ BACK BY AND VERIFIED WITH: [facility] HALLAJI AT 7867 02/01/18 SDR Performed at [facility], [place] [address] [place], [place] [address]    Culture STAPHYLOCOCCUS SPECIES (COAGULASE NEGATIVE) (A)  Final   Report Status 02/04/2018 FINAL  Final   Organism ID, Bacteria STAPHYLOCOCCUS SPECIES (COAGULASE NEGATIVE)  Final      Susceptibility   Staphylococcus species (coagulase negative) - MIC*    CIPROFLOXACIN <=0.5 SENSITIVE Sensitive     ERYTHROMYCIN >=8 RESISTANT Resistant     GENTAMICIN 1 SENSITIVE Sensitive     OXACILLIN <=0.25 SENSITIVE Sensitive     TETRACYCLINE >=16 RESISTANT Resistant     VANCOMYCIN 1 SENSITIVE Sensitive     TRIMETH/SULFA <=10 SENSITIVE Sensitive     CLINDAMYCIN  RESISTANT Resistant     RIFAMPIN <=0.5 SENSITIVE Sensitive     Inducible Clindamycin POSITIVE Resistant     * STAPHYLOCOCCUS SPECIES (COAGULASE NEGATIVE)  Culture, blood (Routine x 2)     Status: Abnormal   Collection Time: 01/31/18  2:55 PM  Result Value Ref Range Status   Specimen Description   Final    BLOOD RIGHT ARM Performed at [facility], [address] [place], [place] [address]    Special Requests   Final    BOTTLES DRAWN AEROBIC AND ANAEROBIC Blood Culture adequate volume Performed at [facility], [address] [place], [place] [address]    Culture  Setup Time   Final    GRAM POSITIVE COCCI IN BOTH AEROBIC AND ANAEROBIC BOTTLES CRITICAL RESULT CALLED TO, READ BACK BY AND VERIFIED WITH: JASON ROBBINS @ 2947 ON 02/01/2018 BY CAF Performed at [facility], [address] [place], [place] [address]    Culture (A)  Final    STAPHYLOCOCCUS SPECIES (COAGULASE NEGATIVE) SUSCEPTIBILITIES PERFORMED ON PREVIOUS CULTURE WITHIN THE LAST 5 DAYS. Performed at [facility], [place] [address], [place], [place] [address]    Report Status 02/04/2018 FINAL  Final  Blood Culture ID Panel (Reflexed)     Status: Abnormal   Collection Time: 01/31/18  2:55 PM  Result Value Ref Range Status   Enterococcus species NOT DETECTED NOT DETECTED Final   Listeria monocytogenes NOT DETECTED NOT DETECTED Final   Staphylococcus species DETECTED (A) NOT DETECTED Final    Comment: Methicillin (oxacillin) resistant coagulase negative staphylococcus. Possible blood culture contaminant (unless isolated from more than one blood culture draw or clinical case suggests pathogenicity). No antibiotic treatment is indicated for blood  culture contaminants. CRITICAL RESULT CALLED TO, READ BACK BY AND VERIFIED WITH:  SHEEMA HALLAJI AT 5681 02/01/18 SDR    Staphylococcus aureus NOT DETECTED NOT DETECTED Final   Methicillin resistance NOT DETECTED NOT DETECTED Corrected     Comment: CRITICAL RESULT CALLED TO, READ BACK BY AND VERIFIED WITH: SHEEMA HALLAJI AT 2751 02/01/18 SDR NOTIFIED DAVID BESANTE AT 0720 02/02/18 Point CORRECTED ON 02/14 AT 0717: PREVIOUSLY REPORTED AS DETECTED CRITICAL RESULT CALLED TO, READ BACK BY AND VERIFIED WITH: SHEEMA HALLAJI AT 7001 02/01/18 SDR    Streptococcus species NOT DETECTED NOT DETECTED Final   Streptococcus agalactiae NOT DETECTED NOT DETECTED Final   Streptococcus pneumoniae NOT DETECTED NOT DETECTED Final   Streptococcus pyogenes NOT DETECTED NOT DETECTED Final   Acinetobacter baumannii NOT DETECTED NOT DETECTED Final   Enterobacteriaceae species NOT DETECTED NOT DETECTED Final   Enterobacter cloacae complex NOT DETECTED NOT DETECTED Final   Escherichia  coli NOT DETECTED NOT DETECTED Final   Klebsiella oxytoca NOT DETECTED NOT DETECTED Final   Klebsiella pneumoniae NOT DETECTED NOT DETECTED Final   Proteus species NOT DETECTED NOT DETECTED Final   Serratia marcescens NOT DETECTED NOT DETECTED Final   Haemophilus influenzae NOT DETECTED NOT DETECTED Final   Neisseria meningitidis NOT DETECTED NOT DETECTED Final   Pseudomonas aeruginosa NOT DETECTED NOT DETECTED Final   Candida albicans NOT DETECTED NOT DETECTED Final   Candida glabrata NOT DETECTED NOT DETECTED Final   Candida krusei NOT DETECTED NOT DETECTED Final   Candida parapsilosis NOT DETECTED NOT DETECTED Final   Candida tropicalis NOT DETECTED NOT DETECTED Final    Comment: Performed at [facility], [address] [place], [place] [address]  MRSA PCR Screening     Status: Abnormal   Collection Time: 01/31/18  5:40 PM  Result Value Ref Range Status   MRSA by PCR POSITIVE (A) NEGATIVE Final    Comment:        The GeneXpert MRSA Assay (FDA approved for NASAL specimens only), is one component of a comprehensive MRSA colonization surveillance program. It is not intended to diagnose MRSA infection nor to guide or monitor treatment for MRSA  infections. RESULT CALLED TO, READ BACK BY AND VERIFIED WITH: amber holland at 1932 on 01/31/2018 jjb Performed at [facility], [address], [place], [place] [address]   Urine culture     Status: Abnormal   Collection Time: 02/01/18  4:34 AM  Result Value Ref Range Status   Specimen Description   Final    URINE, RANDOM Performed at [facility], [address], [place], [place] [address]    Special Requests   Final    NONE Performed at [facility], [address] [place], [place] [address]    Culture (A)  Final    <10,000 COLONIES/mL INSIGNIFICANT GROWTH Performed at [facility], [place] [address], [place], [place] [address]    Report Status 02/02/2018 FINAL  Final  CULTURE, BLOOD (ROUTINE X 2) w Reflex to ID Panel     Status: None (Preliminary result)   Collection Time: 02/05/18  5:29 AM  Result Value Ref Range Status   Specimen Description BLOOD LEFT FOREARM  Final   Special Requests   Final    BOTTLES DRAWN AEROBIC AND ANAEROBIC Blood Culture adequate volume   Culture   Final    NO GROWTH 4 DAYS Performed at [facility], [address], [place], [place] [address]    Report Status PENDING  Incomplete  CULTURE, BLOOD (ROUTINE X 2) w Reflex to ID Panel     Status: None (Preliminary result)   Collection Time: 02/05/18  5:29 AM  Result Value Ref Range Status   Specimen Description BLOOD LEFT HAND  Final   Special Requests   Final    BOTTLES DRAWN AEROBIC AND ANAEROBIC Blood Culture adequate volume   Culture   Final    NO GROWTH 4 DAYS Performed at [facility], [address] [place],  [address]    Report Status PENDING  Incomplete    RADIOLOGY:  No results found.   Management plans discussed with the patient, his son and they are in agreement.  CODE STATUS: DNR   TOTAL TIME TAKING CARE OF THIS PATIENT: 35 minutes.    Demetrios Loll M.D on 02/09/2018 at 9:10 AM  Between 7am to 6pm - Pager -  [phone]  After 6pm go to  [email or web address] - Proofreader  [facility]  Office  [phone]  CC: Primary care physician; System, Pcp Not In   Note: This dictation was prepared with Dragon dictation along with smaller phrase technology. Any transcriptional errors that result from this process are unintentional.

## 2018-02-09 NOTE — Progress Notes (Signed)
Daily Progress Note   Patient Name: Wesley Mcguire       Date: 02/09/2018 DOB: 05-31-1931  Age: 84 y.o. MRN#: [id number] Attending Physician: Wesley Loll, MD Primary Care Physician: System, Pcp Not In Admit Date: 01/31/2018  Reason for Consultation/Follow-up: Establishing goals of care  Subjective: Met with patient's son who is patient's HCPOA.   I have reviewed medical records including EPIC notes, labs and imaging, to discuss diagnosis prognosis, GOC, EOL wishes, disposition and options.  I introduced Palliative Medicine as specialized medical care for people living with serious illness. It focuses on providing relief from the symptoms and stress of a serious illness. The goal is to improve quality of life for both the patient and the family.  We discussed a brief life review of the patient. He is from [place]. Married. Has several children. He enjoys sports- watching ESPN is his favorite pastime. He is a [employer] fan.  As far as functional and nutritional status- he is pretty much bedbound except for going to medical appointments- he has been that way for two years. He has had a poor appetite for the last year since starting the ibrutinib. His son feels his appetite might have decreased over the last month.     We discussed their current illness and what it means in the larger context of their on-going co-morbidities.  Natural disease trajectory and expectations at EOL were discussed. We discussed his CLL and treatment and how this puts him at risk for future infections, recurrent pneumonia.   I attempted to elicit values and goals of care important to the patient. Wesley Mcguire notes that patient has said that as long as he can sit up and enjoy his ESPN and is comfortable then that is what is  important to him.   The difference between aggressive medical intervention and comfort care was considered in light of the patient's goals of care.   Advanced directives, concepts specific to code status, artifical feeding and hydration, and rehospitalization were considered and discussed. MOST form was completed. Choices elected were: DNR, Full scope care including intubation if needed, antibiotics if needed, IV fluids if needed, artificial feeding for trial period if needed. Original copy to go to facility with patient, copy given to patient's HCPOA.   Hospice and Palliative Care services outpatient were explained and offered.  Wesley Mcguire accepted outpatient palliative to follow at facility to continue [place]. Wesley Mcguire is concerned that a transition to Hospice and comfort care may be necessary in the near future and would like to discuss this with his siblings. He is not able to make this decision on his own.   Questions and concerns were addressed.  Hard Choices booklet left for review. The family was encouraged to call with questions or concerns.   Review of Systems  Unable to perform ROS: Other    Length of Stay: 9  Current Medications: Scheduled Meds:  . chlorhexidine  15 mL Mouth Rinse BID  . dronabinol  2.5 mg Oral QAC lunch  . enoxaparin (LOVENOX) injection  40 mg Subcutaneous Q24H  . feeding supplement (ENSURE ENLIVE)  237 mL Oral TID BM  . hydrocortisone sod succinate (SOLU-CORTEF) inj  25 mg Intravenous Daily  . Ibrutinib  420 mg Oral Daily  . insulin aspart  0-15 Units Subcutaneous TID WC  . insulin aspart  0-5 Units Subcutaneous QHS  . insulin detemir  5 Units Subcutaneous QHS  . mouth rinse  15 mL Mouth Rinse q12n4p  . multivitamin with minerals  1 tablet Oral Daily    Continuous Infusions: . sodium chloride 0.9 % 1,000 mL with potassium PHOSPHATE 20 mmol infusion 35 mL/hr at 02/08/18 2305    PRN Meds: acetaminophen **OR** [DISCONTINUED] acetaminophen, alum & mag  hydroxide-simeth, chlorpheniramine-HYDROcodone, ipratropium-albuterol, nitroGLYCERIN, [DISCONTINUED] ondansetron **OR** ondansetron (ZOFRAN) IV, polyethylene glycol, traMADol  Physical Exam  Constitutional: He is oriented to person, place, and time.  cachectic  Cardiovascular: Normal rate.  Pulmonary/Chest: Effort normal.  Neurological: He is alert and oriented to person, place, and time.  Skin: There is pallor.  Nursing note and vitals reviewed.           Vital Signs: BP (!) 109/59 (BP Location: Right Arm)   Pulse 60   Temp 98.2 F (36.8 C) (Oral)   Resp 16   Ht '5\' 6"'$  (1.676 m)   Wt 59.8 kg (131 lb 13.4 oz)   SpO2 95%   BMI 21.28 kg/m  SpO2: SpO2: 95 % O2 Device: O2 Device: Not Delivered O2 Flow Rate: O2 Flow Rate (L/min): 2 L/min  Intake/output summary:   Intake/Output Summary (Last 24 hours) at 02/09/2018 1153 Last data filed at 02/09/2018 1057 Gross per 24 hour  Intake 385 ml  Output 600 ml  Net -215 ml   LBM: Last BM Date: 02/09/18 Baseline Weight: Weight: 59 kg (130 lb) Most recent weight: Weight: 59.8 kg (131 lb 13.4 oz)       Palliative Assessment/Data: PPS: 20%    Flowsheet Rows     Most Recent Value  Intake Tab  Referral Department  Hospitalist  Unit at Time of Referral  Med/Surg Unit  Palliative Care Primary Diagnosis  Sepsis/Infectious Disease  Date Notified  02/04/18  Palliative Care Type  New Palliative care  Reason for referral  Clarify Goals of Care  Date of Admission  01/31/18  Date first seen by Palliative Care  02/07/18  # of days Palliative referral response time  3 Day(s)  # of days IP prior to Palliative referral  4  Clinical Assessment  Psychosocial & Spiritual Assessment  Palliative Care Outcomes      Patient Active Problem List   Diagnosis Date Noted  . HCAP (healthcare-associated pneumonia)   . Goals of care, counseling/discussion   . Palliative care by specialist   . Influenza A 01/31/2018  . Pressure injury of  skin  01/31/2018  . CLL (chronic lymphocytic leukemia) ([place]) 12/27/2017  . Difficulty in urination 06/28/2017  . Prostate enlargement 06/28/2017  . Protein-calorie malnutrition, severe 11/22/2015  . Hyperkalemia 11/21/2015    Palliative Care Assessment & Plan   Patient Profile: 84 y.o. male  with past medical history of CLL (currently on Ibrutinib), DM, chronic anemia, humerus fracture,  admitted on 01/31/2018 with increasing SOB, hypoxia, requiring bipap. Tested positivie for influenza A and chest xray showed pneumonia. He was cachectic, (albumin 2.5), not eating, has multiple pressure injuries to sacrum, R heel, L heel. Now on [place] and improving. Palliative medicine consulted for [place]. Per chart review he has previously been on hospice in 2016.      Assessment/Recommendations/Plan   Social work referral for outpatient Palliative to follow at facility for continued [place]  Continue full scope care  Goals of Care and Additional Recommendations:  Limitations on Scope of Treatment: Full Scope Treatment  Code Status:  DNR  Prognosis:   Unable to determine  Discharge Planning:  [place] for rehab with Palliative care service follow-up  Care plan was discussed with patient's son- Wesley Mcguire Thank you for allowing the Palliative Medicine Team to assist in the care of this patient.   Time In: 1100 Time Out: 1210 Total Time 70 mins Prolonged Time Billed Yes      Greater than 50%  of this time was spent counseling and coordinating care related to the above assessment and plan.  Mariana Kaufman, AGNP-C Palliative Medicine   Please contact Palliative Medicine Team phone at [phone] for questions and concerns.

## 2018-02-09 NOTE — Discharge Instructions (Signed)
[place] and fall precaution. Palliative care follow up in SNF.

## 2018-02-09 NOTE — Progress Notes (Signed)
New referral for out patient PALLIATIVE to follow at [facility] received from [place].Plan is for discharge today. Patient information faxed to referral. Flo Shanks RN, BSN, [place] [facility] [place], [facility] [phone]

## 2018-02-09 NOTE — Progress Notes (Signed)
Pt is being discharged to [facility]. Report given to [facility], RN. AVS placed in discharge packet. Awaiting EMS.

## 2018-02-09 NOTE — Clinical Social Work Note (Signed)
Patient to be d/c'ed today to [facility]. Patient and family agreeable to plans will transport via ems RN to call report [phone].  CSW left message on patient's son Remo Lipps [phone] voice mail.  Evette Cristal, MSW, [place]

## 2018-02-09 NOTE — ACP (Advance Care Planning) (Signed)
Advance Care Planning Note:   Advanced directives, concepts specific to code status, artifical feeding and hydration, and rehospitalization were considered and discussed. MOST form was completed. Choices elected were: DNR, Full scope care including intubation if needed, antibiotics if needed, IV fluids if needed, artificial feeding for trial period if needed. Original copy to go to facility with patient, copy given to patient's HCPOA.   Wesley Mcguire, AGNP-C Palliative Medicine  Please call Palliative Medicine team phone with any questions [phone]. For individual providers please see AMION.

## 2018-02-09 NOTE — Plan of Care (Signed)
  Education: Knowledge of General Education information will improve 02/09/2018 0158 - Progressing by Scharlene Gloss, RN   Health Behavior/Discharge Planning: Ability to manage health-related needs will improve 02/09/2018 0158 - Progressing by Scharlene Gloss, RN   Clinical Measurements: Ability to maintain clinical measurements within normal limits will improve 02/09/2018 0158 - Progressing by Scharlene Gloss, RN   Clinical Measurements: Will remain free from infection 02/09/2018 0158 - Progressing by Scharlene Gloss, RN   Clinical Measurements: Diagnostic test results will improve 02/09/2018 0158 - Progressing by Scharlene Gloss, RN   Clinical Measurements: Respiratory complications will improve 02/09/2018 0158 - Progressing by Scharlene Gloss, RN   Activity: Risk for activity intolerance will decrease 02/09/2018 0158 - Progressing by Scharlene Gloss, RN

## 2018-02-09 NOTE — Clinical Social Work Note (Signed)
Clinical Social Work Assessment  Patient Details  Name: Wesley Mcguire MRN: [id number] Date of Birth: 1931-03-21  Date of referral:  02/09/18               Reason for consult:  Facility Placement                Permission sought to share information with:  Facility Sport and exercise psychologist, Family Supports Permission granted to share information::  Yes, Verbal Permission Granted  Name::     Wesley Mcguire, Wesley Mcguire [phone]  or Wesley Mcguire, Wesley Mcguire Daughter   [phone]   Agency::  SNF admissions  Relationship::     Contact Information:     Housing/Transportation Living arrangements for the past 2 months:  [place] of Information:  Medical Team, Adult Children Patient Interpreter Needed:  None Criminal Activity/Legal Involvement Pertinent to Current Situation/Hospitalization:  No - Comment as needed Significant Relationships:  Adult Children Lives with:  Facility Resident Do you feel safe going back to the place where you live?  Yes Need for family participation in patient care:  No (Coment)  Care giving concerns:  Patient is a long term care resident at [facility].  Patient's family did not express any concerns about him returning back to SNF.   Social Worker assessment / plan:  Patient is an 84 year old male who is alert and oriented x2.  Part of the assessment was completed by reviewing medical charge and conversation that case manager had with son.  Patient has been at [facility] for several months, patient and his family are in agreement to returning back to SNF.  Patient's family did not express any other issues or concerns, patient's information has been faxed to [facility].  Employment status:  Retired Forensic scientist:  Medicare PT Recommendations:  [place] / Referral to community resources:     Patient/Family's Response to care:  Patient and family in agreement to return back to  SNF.  Patient/Family's Understanding of and Emotional Response to Diagnosis, Current Treatment, and Prognosis: Patient's aware of current treatment plan and prognosis.  Emotional Assessment Appearance:  Appears stated age Attitude/Demeanor/Rapport:    Affect (typically observed):  Appropriate, Calm Orientation:  Oriented to Self, Oriented to Place Alcohol / Substance use:  Not Applicable Psych involvement (Current and /or in the community):  No (Comment)  Discharge Needs  Concerns to be addressed:  Cognitive Concerns, Care Coordination Readmission within the last 30 days:  No Current discharge risk:  Cognitively Impaired Barriers to Discharge:  No Barriers Identified   Ross Ludwig, LCSWA 02/09/2018, 12:05 PM

## 2018-02-10 LAB — CULTURE, BLOOD (ROUTINE X 2)
CULTURE: NO GROWTH
Culture: NO GROWTH
SPECIAL REQUESTS: ADEQUATE
Special Requests: ADEQUATE

## 2018-02-21 ENCOUNTER — Inpatient Hospital Stay: Payer: No Typology Code available for payment source

## 2018-02-21 ENCOUNTER — Other Ambulatory Visit: Payer: Self-pay

## 2018-02-21 ENCOUNTER — Inpatient Hospital Stay: Payer: No Typology Code available for payment source | Attending: Internal Medicine | Admitting: Internal Medicine

## 2018-02-21 VITALS — BP 134/93 | HR 93 | Temp 95.0°F | Resp 20 | Wt 123.0 lb

## 2018-02-21 DIAGNOSIS — E1165 Type 2 diabetes mellitus with hyperglycemia: Secondary | ICD-10-CM | POA: Insufficient documentation

## 2018-02-21 DIAGNOSIS — R748 Abnormal levels of other serum enzymes: Secondary | ICD-10-CM | POA: Insufficient documentation

## 2018-02-21 DIAGNOSIS — Z7952 Long term (current) use of systemic steroids: Secondary | ICD-10-CM | POA: Insufficient documentation

## 2018-02-21 DIAGNOSIS — Z9225 Personal history of immunosupression therapy: Secondary | ICD-10-CM | POA: Insufficient documentation

## 2018-02-21 DIAGNOSIS — R630 Anorexia: Secondary | ICD-10-CM | POA: Diagnosis not present

## 2018-02-21 DIAGNOSIS — Z794 Long term (current) use of insulin: Secondary | ICD-10-CM | POA: Diagnosis not present

## 2018-02-21 DIAGNOSIS — C9110 Chronic lymphocytic leukemia of B-cell type not having achieved remission: Secondary | ICD-10-CM | POA: Insufficient documentation

## 2018-02-21 DIAGNOSIS — C180 Malignant neoplasm of cecum: Secondary | ICD-10-CM

## 2018-02-21 DIAGNOSIS — Z79899 Other long term (current) drug therapy: Secondary | ICD-10-CM

## 2018-02-21 LAB — CBC WITH DIFFERENTIAL/PLATELET
BASOS PCT: 0 %
Basophils Absolute: 0.1 10*3/uL (ref 0–0.1)
EOS ABS: 0 10*3/uL (ref 0–0.7)
Eosinophils Relative: 0 %
HCT: 42.8 % (ref 40.0–52.0)
Hemoglobin: 13.6 g/dL (ref 13.0–18.0)
Lymphocytes Relative: 44 %
Lymphs Abs: 7.5 10*3/uL — ABNORMAL HIGH (ref 1.0–3.6)
MCH: 26.5 pg (ref 26.0–34.0)
MCHC: 31.8 g/dL — AB (ref 32.0–36.0)
MCV: 83.4 fL (ref 80.0–100.0)
Monocytes Absolute: 0.7 10*3/uL (ref 0.2–1.0)
Monocytes Relative: 4 %
Neutro Abs: 8.6 10*3/uL — ABNORMAL HIGH (ref 1.4–6.5)
Neutrophils Relative %: 52 %
PLATELETS: 127 10*3/uL — AB (ref 150–440)
RBC: 5.13 MIL/uL (ref 4.40–5.90)
RDW: 19.8 % — ABNORMAL HIGH (ref 11.5–14.5)
WBC: 16.9 10*3/uL — AB (ref 3.8–10.6)

## 2018-02-21 LAB — COMPREHENSIVE METABOLIC PANEL
ALK PHOS: 159 U/L — AB (ref 38–126)
ALT: 22 U/L (ref 17–63)
AST: 23 U/L (ref 15–41)
Albumin: 3.3 g/dL — ABNORMAL LOW (ref 3.5–5.0)
Anion gap: 10 (ref 5–15)
BILIRUBIN TOTAL: 1.4 mg/dL — AB (ref 0.3–1.2)
BUN: 34 mg/dL — ABNORMAL HIGH (ref 6–20)
CALCIUM: 8.3 mg/dL — AB (ref 8.9–10.3)
CO2: 24 mmol/L (ref 22–32)
CREATININE: 0.56 mg/dL — AB (ref 0.61–1.24)
Chloride: 98 mmol/L — ABNORMAL LOW (ref 101–111)
GFR calc Af Amer: >60 mL/min (ref >60)
Glucose, Bld: 464 mg/dL — ABNORMAL HIGH (ref 65–99)
Potassium: 4.3 mmol/L (ref 3.5–5.1)
Sodium: 132 mmol/L — ABNORMAL LOW (ref 135–145)
Total Protein: 5.8 g/dL — ABNORMAL LOW (ref 6.5–8.1)

## 2018-02-21 MED ORDER — PREDNISONE 20 MG PO TABS: 20.0000 mg | ORAL_TABLET | Freq: Every day | ORAL | 0 refills | Status: DC

## 2018-02-21 NOTE — Progress Notes (Signed)
Wesley Mcguire OFFICE PROGRESS NOTE  Patient Care Team: System, Pcp Not In as PCP - General   SUMMARY OF ONCOLOGIC HISTORY:  Oncology History   # [name] s/p Rituxan; NOV 2016- Recurrence; DEC 2016-CT A/P-- Bulky confluent retroperitoneal, mesenteric and bilateral pelvic lymphadenopathy, mildly increased since 2012]. 2017 JAN- Infusion reaction to Rituxan [stopped];   # FEB 2017- IBRUTINIB 1 pill a day; FISH- POSITIVE FOR HOMOZYGOUS 13Q DELETION;  POSITIVE FOR 17P GENE DELETION; SEP 1st 2018- 469m/day;    # Molecular testing: Oct 2018- 70% OF NUCLEI POSITIVE FOR HOMOZYGOUS 13Q DELETION; 73% Of NUCLEI POSITIVE FOR TP53 GENE DELETION   # Poorly controlled DM/ cachexia/Left humerus fracture s/p fall [dec 2016]; pt in hospice [2016]      INTERVAL HISTORY: Poor/vague historian. He is accompanied by his son.  89year old male patient frail/multiple comorbidities with above history of recurrent CLL- currently rehabilitation is on Ibrutinib 420 mg [since sep 1st 2018 ]mg pill a day- Is here for follow-up.  In the interim patient was admitted to the hospital for acute hypoxic respiratory failure; tested positive for influenza A status post treatment with Tamiflu.  Patient was taken off long-acting Levemir insulin at last admission because of hypoglycemia.  He is just on insulin short-acting once a day.   No falls. No easy bruising. No weight loss.  No nausea no vomiting.  No lightheadedness no fevers or chills.   He is accompanied by his son.  REVIEW OF SYSTEMS:  A complete 10 point review of system is done which is negative except mentioned above/history of present illness.   PAST MEDICAL HISTORY :  Past Medical History:  Diagnosis Date  . Anemia   . C[place] ([place]ocytic [place]) ([facility]   . Diabetes mellitus without complication ([facility]   . Humerus fracture   . Leukemia ([facility]   . Neuromuscular disorder ([facility]    muscle weakness    PAST SURGICAL HISTORY :  No past  surgical history on file.  FAMILY HISTORY :   Family History  Problem Relation Age of Onset  . Cancer Sister     SOCIAL HISTORY:   Social History   Tobacco Use  . Smoking status: Never Smoker  . Smokeless tobacco: Never Used  Substance Use Topics  . Alcohol use: No  . Drug use: No    ALLERGIES:  has No Known Allergies.  MEDICATIONS:  Current Outpatient Medications  Medication Sig Dispense Refill  . aluminum-magnesium hydroxide-simethicone (MAALOX) 2400-867-61MG/5ML SUSP Take 30 mLs by mouth every 6 (six) hours as needed (for indigestion/heartburn).    . collagenase (SANTYL) ointment Apply 1 application topically daily.    . feeding supplement, GLUCERNA SHAKE, (GLUCERNA SHAKE) LIQD Take 237 mLs by mouth 4 (four) times daily -  with meals and at bedtime.    .Marland Kitchenglucagon (GLUCAGEN) 1 MG SOLR injection Inject 1 mg into the vein once as needed for low blood sugar.    . Ibrutinib 420 MG TABS Take 420 mg by mouth daily. 30 tablet 4  . insulin aspart (NOVOLOG) 100 UNIT/ML injection Inject 5 Units daily into the skin.     . Multiple Vitamin (MULTIVITAMIN) tablet Take 1 tablet by mouth daily.    . Multiple Vitamin (MULTIVITAMIN) tablet Take 1 tablet by mouth daily.    . predniSONE (DELTASONE) 20 MG tablet Take 20 mg by mouth daily with breakfast.    . erythromycin ophthalmic ointment     . glucagon (GLUCAGON EMERGENCY) 1 MG injection  Inject 1 mg into the vein once as needed.    . nitroGLYCERIN (NITROSTAT) 0.4 MG SL tablet Place 0.4 mg under the tongue every 5 (five) minutes x 3 doses as needed for chest pain.      No current facility-administered medications for this visit.     PHYSICAL EXAMINATION: ECOG PERFORMANCE STATUS: 3 - Symptomatic, >50% confined to bed  BP (!) 134/93 (BP Location: Left Arm, Patient Position: Sitting)   Pulse 93   Temp (!) 95 F (35 C)   Resp 20   Wt 123 lb (55.8 kg) Comment: per Darrick Penna nurse at Post care -weight taken Feb 27/2019  BMI 19.85  kg/m   GENERAL: Cachectic appearing Caucasian male patient Alert, no distress and comfortable.   He is in a wheelchair. He is accompanied by his son.  EYES: no pallor or icterus;  OROPHARYNX: no thrush or ulceration;poor dentition. Marland Kitchen NECK: supple, no masses felt LYMPH: positive for palpable lymphadenopathy in the  bil axillary; inguinal regions cannot be examined LUNGS: clear to auscultation and  No wheeze or crackles HEART/CVS: regular rate & rhythm and no murmurs; No lower extremity edema ABDOMEN:abdomen soft, non-tender and normal bowel sounds Musculoskeletal:no cyanosis of digits and no clubbing.  PSYCH: alert & oriented x 3  NEURO: no focal motor/sensory deficits SKIN:  no rashes or significant lesions   LABORATORY DATA:  I have reviewed the data as listed    Component Value Date/Time   NA 132 (L) 02/21/2018 1017   NA 143 06/04/2012 1240   K 4.3 02/21/2018 1017   K 4.2 06/04/2012 1240   CL 98 (L) 02/21/2018 1017   CL 106 06/04/2012 1240   CO2 24 02/21/2018 1017   CO2 29 06/04/2012 1240   GLUCOSE 464 (H) 02/21/2018 1017   GLUCOSE 208 (H) 06/04/2012 1240   BUN 34 (H) 02/21/2018 1017   BUN 26 (H) 06/04/2012 1240   CREATININE 0.56 (L) 02/21/2018 1017   CREATININE 0.82 06/04/2012 1240   CALCIUM 8.3 (L) 02/21/2018 1017   CALCIUM 8.6 06/04/2012 1240   PROT 5.8 (L) 02/21/2018 1017   PROT 6.0 (L) 06/04/2012 1240   ALBUMIN 3.3 (L) 02/21/2018 1017   ALBUMIN 3.4 06/04/2012 1240   AST 23 02/21/2018 1017   AST 11 (L) 06/04/2012 1240   ALT 22 02/21/2018 1017   ALT 16 06/04/2012 1240   ALKPHOS 159 (H) 02/21/2018 1017   ALKPHOS 79 06/04/2012 1240   BILITOT 1.4 (H) 02/21/2018 1017   BILITOT 1.1 (H) 06/04/2012 1240   GFRNONAA >60 02/21/2018 1017   GFRNONAA >60 06/04/2012 1240   GFRAA >60 02/21/2018 1017   GFRAA >60 06/04/2012 1240    No results found for: SPEP, UPEP  Lab Results  Component Value Date   WBC 16.9 (H) 02/21/2018   NEUTROABS 8.6 (H) 02/21/2018   HGB 13.6  02/21/2018   HCT 42.8 02/21/2018   MCV 83.4 02/21/2018   PLT 127 (L) 02/21/2018      Chemistry      Component Value Date/Time   NA 132 (L) 02/21/2018 1017   NA 143 06/04/2012 1240   K 4.3 02/21/2018 1017   K 4.2 06/04/2012 1240   CL 98 (L) 02/21/2018 1017   CL 106 06/04/2012 1240   CO2 24 02/21/2018 1017   CO2 29 06/04/2012 1240   BUN 34 (H) 02/21/2018 1017   BUN 26 (H) 06/04/2012 1240   CREATININE 0.56 (L) 02/21/2018 1017   CREATININE 0.82 06/04/2012 1240  Component Value Date/Time   CALCIUM 8.3 (L) 02/21/2018 1017   CALCIUM 8.6 06/04/2012 1240   ALKPHOS 159 (H) 02/21/2018 1017   ALKPHOS 79 06/04/2012 1240   AST 23 02/21/2018 1017   AST 11 (L) 06/04/2012 1240   ALT 22 02/21/2018 1017   ALT 16 06/04/2012 1240   BILITOT 1.4 (H) 02/21/2018 1017   BILITOT 1.1 (H) 06/04/2012 1240        ASSESSMENT & PLAN:  CLL (chronic lymphocytic leukemia) ([place]) # CLL- 17p del- patient is symptomatic currently on ibrutinib since feb 2017- clinical response noted. Currently on ibrutinib 420 [increased dose since 08/20/2017] mg/day; tolerating well-no evidence of any bruising or A. fib.  #  White count today 16 [ALC- 15] /STABLE; normal hemoglobin/platelets-123. Continue Ibrutinib.    #Recent hypoxic respiratory failure/pneumonia/influenza A-status post hospitalization Tamiflu; improved.  # Elevated alkaline phosphatase- question Ibrutinib- stable/improving. Stable around 150.  # Elevated Blood glucose- 464-sec to dex [see discussion below] recommend starting back on levemir insulin/ blood glucose medications. Discussed with pt's RN at the nursing home.   # Poor appetite- recommend discontinuation of dexamethasone given elevated blood sugars.  Recommend prednisone 20 mg once a day for 10 days.  Prescription given.  # follow up in 6 weeks/ Labs.  Reviewed with the patient's son in detail.     Cammie Sickle, MD 02/21/2018 1:53 PM

## 2018-02-21 NOTE — Assessment & Plan Note (Addendum)
#   CLL- 17p del- patient is symptomatic currently on ibrutinib since feb 2017- clinical response noted. Currently on ibrutinib 420 [increased dose since 08/20/2017] mg/day; tolerating well-no evidence of any bruising or A. fib.  #  White count today 16 [ALC- 15] /STABLE; normal hemoglobin/platelets-123. Continue Ibrutinib.    #Recent hypoxic respiratory failure/pneumonia/influenza A-status post hospitalization Tamiflu; improved.  # Elevated alkaline phosphatase- question Ibrutinib- stable/improving. Stable around 150.  # Elevated Blood glucose- 464-sec to dex [see discussion below] recommend starting back on levemir insulin/ blood glucose medications. Discussed with pt's RN at the nursing home.   # Poor appetite- recommend discontinuation of dexamethasone given elevated blood sugars.  Recommend prednisone 20 mg once a day for 10 days.  Prescription given.  # follow up in 6 weeks/ Labs.  Reviewed with the patient's son in detail.

## 2018-02-21 NOTE — Progress Notes (Signed)
Pt here for follow up. Pt in icu w dx of pneumonia and flu Feb 18/19 x 8 day.appears weak w L elbow pain c/o

## 2018-03-20 DEATH — deceased

## 2018-04-14 ENCOUNTER — Telehealth: Payer: Self-pay | Admitting: *Deleted

## 2018-04-14 NOTE — Telephone Encounter (Signed)
[employer] support program for [employer] for ibrutinib calling re: patient Wesley Mcguire (phone: [phone]; Ref [id number]).  Spoke with cindy - The clinical support line was calling to determine what the cause of death was for this patient. Pt has been on ibruitnib.  RN Reviewed chart. Team unaware that patient has passed.  Caregiver had notified bi[employer]ics/[employer] support program that patient had passed. [name] wanted to know the cause of death. I explained to her that our office was not aware of the patient's death or the cause of death. She asked if patient was on ibrutinib at the last office visit. I confirmed that at the last apt but was given instructions to continue taking the ibruitnib as directed. Pt resided at [facility]  I made Dr. Rogue Bussing aware of the patient's death.
